# Patient Record
Sex: Male | Born: 1965
Health system: Southern US, Community
[De-identification: ages and names within clinical notes are randomized; demographics above are authoritative.]

## PROBLEM LIST (undated history)

## (undated) DIAGNOSIS — F329 Major depressive disorder, single episode, unspecified: Secondary | ICD-10-CM

## (undated) DIAGNOSIS — F32A Depression, unspecified: Secondary | ICD-10-CM

## (undated) DIAGNOSIS — E871 Hypo-osmolality and hyponatremia: Secondary | ICD-10-CM

## (undated) DIAGNOSIS — IMO0002 Reserved for concepts with insufficient information to code with codable children: Secondary | ICD-10-CM

## (undated) DIAGNOSIS — I8500 Esophageal varices without bleeding: Secondary | ICD-10-CM

## (undated) DIAGNOSIS — J449 Chronic obstructive pulmonary disease, unspecified: Secondary | ICD-10-CM

## (undated) DIAGNOSIS — K219 Gastro-esophageal reflux disease without esophagitis: Secondary | ICD-10-CM

## (undated) DIAGNOSIS — N179 Acute kidney failure, unspecified: Secondary | ICD-10-CM

## (undated) DIAGNOSIS — R0600 Dyspnea, unspecified: Secondary | ICD-10-CM

## (undated) DIAGNOSIS — M1990 Unspecified osteoarthritis, unspecified site: Secondary | ICD-10-CM

## (undated) DIAGNOSIS — G629 Polyneuropathy, unspecified: Secondary | ICD-10-CM

## (undated) DIAGNOSIS — G709 Myoneural disorder, unspecified: Secondary | ICD-10-CM

## (undated) DIAGNOSIS — D649 Anemia, unspecified: Secondary | ICD-10-CM

## (undated) DIAGNOSIS — E876 Hypokalemia: Secondary | ICD-10-CM

## (undated) DIAGNOSIS — Z5189 Encounter for other specified aftercare: Secondary | ICD-10-CM

## (undated) DIAGNOSIS — K7460 Unspecified cirrhosis of liver: Secondary | ICD-10-CM

## (undated) DIAGNOSIS — I609 Nontraumatic subarachnoid hemorrhage, unspecified: Secondary | ICD-10-CM

## (undated) DIAGNOSIS — F1910 Other psychoactive substance abuse, uncomplicated: Secondary | ICD-10-CM

## (undated) DIAGNOSIS — F419 Anxiety disorder, unspecified: Secondary | ICD-10-CM

## (undated) HISTORY — DX: Anxiety disorder, unspecified: F41.9

## (undated) HISTORY — DX: Anemia, unspecified: D64.9

## (undated) HISTORY — PX: CHOLECYSTECTOMY: SHX55

## (undated) HISTORY — DX: Other psychoactive substance abuse, uncomplicated: F19.10

## (undated) HISTORY — DX: Chronic obstructive pulmonary disease, unspecified: J44.9

## (undated) HISTORY — DX: Hypokalemia: E87.6

## (undated) HISTORY — DX: Polyneuropathy, unspecified: G62.9

## (undated) HISTORY — DX: Encounter for other specified aftercare: Z51.89

## (undated) HISTORY — DX: Acute kidney failure, unspecified: N17.9

## (undated) HISTORY — DX: Hypo-osmolality and hyponatremia: E87.1

## (undated) HISTORY — DX: Unspecified cirrhosis of liver: K74.60

## (undated) HISTORY — DX: Depression, unspecified: F32.A

## (undated) HISTORY — DX: Unspecified osteoarthritis, unspecified site: M19.90

## (undated) HISTORY — DX: Reserved for concepts with insufficient information to code with codable children: IMO0002

## (undated) HISTORY — DX: Major depressive disorder, single episode, unspecified: F32.9

---

## 2015-04-24 ENCOUNTER — Encounter: Payer: Self-pay | Admitting: Internal Medicine

## 2015-05-21 ENCOUNTER — Ambulatory Visit: Payer: Self-pay | Admitting: Gastroenterology

## 2015-06-10 ENCOUNTER — Encounter: Payer: Self-pay | Admitting: Gastroenterology

## 2015-06-10 ENCOUNTER — Ambulatory Visit (INDEPENDENT_AMBULATORY_CARE_PROVIDER_SITE_OTHER): Payer: Self-pay | Admitting: Gastroenterology

## 2015-06-10 ENCOUNTER — Other Ambulatory Visit: Payer: Self-pay

## 2015-06-10 VITALS — BP 100/69 | HR 74 | Temp 96.8°F | Ht 68.0 in | Wt 236.2 lb

## 2015-06-10 DIAGNOSIS — F329 Major depressive disorder, single episode, unspecified: Secondary | ICD-10-CM

## 2015-06-10 DIAGNOSIS — K7460 Unspecified cirrhosis of liver: Secondary | ICD-10-CM | POA: Insufficient documentation

## 2015-06-10 DIAGNOSIS — F419 Anxiety disorder, unspecified: Secondary | ICD-10-CM

## 2015-06-10 DIAGNOSIS — K7030 Alcoholic cirrhosis of liver without ascites: Secondary | ICD-10-CM

## 2015-06-10 DIAGNOSIS — F32A Depression, unspecified: Secondary | ICD-10-CM

## 2015-06-10 LAB — COMPLETE METABOLIC PANEL WITH GFR
ALBUMIN: 3.7 g/dL (ref 3.6–5.1)
ALT: 40 U/L (ref 9–46)
AST: 96 U/L — AB (ref 10–40)
Alkaline Phosphatase: 94 U/L (ref 40–115)
BILIRUBIN TOTAL: 3.6 mg/dL — AB (ref 0.2–1.2)
BUN: 6 mg/dL — AB (ref 7–25)
CALCIUM: 8.8 mg/dL (ref 8.6–10.3)
CHLORIDE: 99 mmol/L (ref 98–110)
CO2: 28 mmol/L (ref 20–31)
CREATININE: 0.6 mg/dL (ref 0.60–1.35)
GFR, Est African American: >89 mL/min (ref >=60)
GFR, Est Non African American: >89 mL/min (ref >=60)
Glucose, Bld: 79 mg/dL (ref 65–99)
Potassium: 3.7 mmol/L (ref 3.5–5.3)
Sodium: 136 mmol/L (ref 135–146)
TOTAL PROTEIN: 6.7 g/dL (ref 6.1–8.1)

## 2015-06-10 MED ORDER — LACTULOSE 10 GM/15ML PO SOLN: 10.0000 g | Freq: Two times a day (BID) | ORAL | Status: DC | PRN

## 2015-06-10 NOTE — Patient Instructions (Addendum)
Start taking lactulose twice a day to have 2-3 soft bowel movements.   We have scheduled you for an upper endoscopy with Dr. Jena Gauss.   Please have blood work done.   Great job on avoiding alcohol! Keep up the good work.   We will see you in 3 months.   Cirrhosis Cirrhosis is a condition of scarring of the liver which is caused when the liver has tried repairing itself following damage. This damage may come from a previous infection such as one of the forms of hepatitis (usually hepatitis C), or the damage may come from being injured by toxins. The main toxin that causes this damage is alcohol. The scarring of the liver from use of alcohol is irreversible. That means the liver cannot return to normal even though alcohol is not used any more. The main danger of hepatitis C infection is that it may cause long-lasting (chronic) liver disease, and this also may lead to cirrhosis. This complication is progressive and irreversible. CAUSES  Prior to available blood tests, hepatitis C could be contracted by blood transfusions. Since testing of blood has improved, this is now unlikely. This infection can also be contracted through intravenous drug use and the sharing of needles. It can also be contracted through sexual relationships. The injury caused by alcohol comes from too much use. It is not a few drinks that poison the liver, but years of misuse. Usually there will be some signs and symptoms early with scarring of the liver that suggest the development of better habits. Alcohol should never be used while using acetaminophen. A small dose of both taken together may cause irreversible damage to the liver. HOME CARE INSTRUCTIONS  There is no specific treatment for cirrhosis. However, there are things you can do to avoid making the condition worse.  Rest as needed.  Eat a well-balanced diet. Your caregiver can help you with suggestions.  Vitamin supplements including vitamins A, K, D, and thiamine can  help.  A low-salt diet, water restriction, or diuretic medicine may be needed to reduce fluid retention.  Avoid alcohol. This can be extremely toxic if combined with acetaminophen.  Avoid drugs which are toxic to the liver. Some of these include isoniazid, methyldopa, acetaminophen, anabolic steroids (muscle-building drugs), erythromycin, and oral contraceptives (birth control pills). Check with your caregiver to make sure medicines you are presently taking will not be harmful.  Periodic blood tests may be required. Follow your caregiver's advice regarding the timing of these.  Milk thistle is an herbal remedy which does protect the liver against toxins. However, it will not help once the liver has been scarred. SEEK MEDICAL CARE IF:  You have increasing fatigue or weakness.  You develop swelling of the hands, feet, legs, or face.  You vomit bright red blood, or a coffee ground appearing material.  You have blood in your stools, or the stools turn black and tarry.  You have a fever.  You develop loss of appetite, or have nausea and vomiting.  You develop jaundice.  You develop easy bruising or bleeding.  You have worsening of any of the problems you are concerned about. Document Released: 09/14/2005 Document Revised: 12/07/2011 Document Reviewed: 05/02/2008 [facility] Patient Information 2015 [place], [place]. This information is not intended to replace advice given to you by your health care provider. Make sure you discuss any questions you have with your health care provider.

## 2015-06-10 NOTE — Progress Notes (Signed)
Primary Care Physician:  Newman Nip, NP Primary Gastroenterologist:  Dr. Jena Gauss   Chief Complaint  Patient presents with  . Abdominal Pain    lower abd pain, comes and goes for several months    HPI:   Joseph Cortez is a 49 y.o. male presenting today at the request of his PCP secondary to history of gallstone pancreatitis (July 2016) and cirrhosis. Underwent cholecystectomy April 03, 2015 at [place]. Last imaging July 2016 via [facility] noting hepatic cirrhosis, mild diffuse pancreatic swelling and peripancreatic soft tissue stranding seen, consistent with acute pancreatitis.   Intermittent abdominal pain, lower abdominal pain. No association with bowel movements. Seemed to correlate some with increased ETOH use. No hematochezia. Maternal uncle had colon cancer. Feels confused at times. Hard to remember names. Joseph Cortez present. Takes a few minutes to remember names. Friend states he feels like it is slow to process. First told he had liver disease 3 years ago. No reflux or solid food dysphagia. Noted pill dysphagia if a "big pill". Appetite is good. Smoked cocaine a few years ago. No IV drug use. Recent FFP while inpatient. No tattoos.   No alcohol in 4 days. Historically has had about 2/5ths of wine a day.   No prior colonoscopy or prior endoscopy.   Past Medical History  Diagnosis Date  . Cirrhosis   . Anxiety   . Depression   . Neuropathy     Past Surgical History  Procedure Laterality Date  . Cholecystectomy      Current Outpatient Prescriptions  Medication Sig Dispense Refill  . clonazePAM (KLONOPIN) 0.5 MG tablet Take 0.5 mg by mouth daily.    . Multiple Vitamins-Minerals (MULTIVITAMIN WITH MINERALS) tablet Take 1 tablet by mouth daily.     No current facility-administered medications for this visit.    Allergies as of 06/10/2015  . (No Known Allergies)    Family History  Problem Relation Age of Onset  . Colon cancer Maternal Uncle     Social  History   Social History  . Marital Status: Single    Spouse Name: N/A  . Number of Children: N/A  . Years of Education: N/A   Occupational History  . Not on file.   Social History Main Topics  . Smoking status: Current Every Day Smoker -- 0.50 packs/day    Types: Cigarettes  . Smokeless tobacco: Not on file  . 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 Drug Use: No     Comment: cocaine historically, several years ago  . Sexual Activity: Not on file   Other Topics Concern  . Not on file   Social History Narrative  . No narrative on file    Review of Systems: Gen: see HPI CV: occasional palpitations  Resp: +DOE GI: see HPI GU : Denies urinary burning, urinary frequency, urinary hesitancy MS: +muscle weakness  Derm: Denies rash, itching, dry skin Psych: +depression/anxiety  Heme: Denies bruising, bleeding, and enlarged lymph nodes.  Physical Exam: BP 100/69 mmHg  Pulse 74  Temp(Src) 96.8 F (36 C) (Oral)  Ht  (1.727 m)  Wt 236 lb 3.2 oz (107.14 kg)  BMI 35.92 kg/m2 General:   Alert and oriented. Pleasant and cooperative. Well-nourished and well-developed.  Head:  Normocephalic and atraumatic. Eyes:  Without icterus, sclera clear  and conjunctiva pink.  Ears:  Normal auditory acuity. Nose:  No deformity, discharge,  or lesions.  Mouth:  No deformity or lesions, oral mucosa pink.  Lungs:  Clear to auscultation bilaterally. No wheezes, rales, or rhonchi. No distress.  Heart:  S1, S2 present without murmurs appreciated.  Abdomen:  +BS, soft, largely obese with large AP diameter. non-tender and non-distended. No fluid wave.   Rectal:  Deferred  Msk:  Symmetrical without gross deformities. Normal posture. Extremities:  Without edema. Neurologic:  Alert and  oriented x4;  grossly normal neurologically. Negative asterixis.  Psych:  Alert and cooperative.  Normal mood and affect.    

## 2015-06-11 ENCOUNTER — Other Ambulatory Visit: Payer: Self-pay

## 2015-06-11 LAB — CBC WITH DIFFERENTIAL/PLATELET
BASOS ABS: 0.1 10*3/uL (ref 0.0–0.1)
Basophils Relative: 1 % (ref 0–1)
Eosinophils Absolute: 0.1 10*3/uL (ref 0.0–0.7)
Eosinophils Relative: 2 % (ref 0–5)
HEMATOCRIT: 43.9 % (ref 39.0–52.0)
HEMOGLOBIN: 15 g/dL (ref 13.0–17.0)
LYMPHS ABS: 1.6 10*3/uL (ref 0.7–4.0)
LYMPHS PCT: 24 % (ref 12–46)
MCH: 33 pg (ref 26.0–34.0)
MCHC: 34.2 g/dL (ref 30.0–36.0)
MCV: 96.5 fL (ref 78.0–100.0)
MPV: 9.8 fL (ref 8.6–12.4)
Monocytes Absolute: 0.7 10*3/uL (ref 0.1–1.0)
Monocytes Relative: 10 % (ref 3–12)
NEUTROS ABS: 4.3 10*3/uL (ref 1.7–7.7)
Neutrophils Relative %: 63 % (ref 43–77)
Platelets: 142 10*3/uL — ABNORMAL LOW (ref 150–400)
RBC: 4.55 MIL/uL (ref 4.22–5.81)
RDW: 14.5 % (ref 11.5–15.5)
WBC: 6.8 10*3/uL (ref 4.0–10.5)

## 2015-06-11 LAB — HEPATITIS C ANTIBODY: HCV Ab: NEGATIVE

## 2015-06-11 LAB — ANA: ANA: NEGATIVE

## 2015-06-11 LAB — ANTI-SMOOTH MUSCLE ANTIBODY, IGG: Smooth Muscle Ab: 7 U (ref <20)

## 2015-06-11 LAB — MITOCHONDRIAL ANTIBODIES: Mitochondrial M2 Ab, IgG: 0.44 (ref <0.91)

## 2015-06-14 ENCOUNTER — Telehealth: Payer: Self-pay | Admitting: Gastroenterology

## 2015-06-14 NOTE — Assessment & Plan Note (Addendum)
49 year old male with cirrhosis, history of ETOH abuse, presenting to establish care. Imaging up-to-date and due again in Jan 2017. States he feels confused at times; negative asterixis on exam. Unclear if hepatic encephalopathy or secondary to chronic ETOH use. Will start Lactulose dosing and I have advised him not to drive. Needs further work-up for etiology of cirrhosis, although it is likely due to ETOH/fatty liver. Needs EGD for variceal screening and immunization against Hep A and B. Close follow-up, returning in 3 months. As of note, pill dysphagia with large pills. No solid food dysphagia. Likely no occult issue but will add possible dilation if needed.   Proceed with upper endoscopy+/-dilation in the near future with Dr. Jena Gauss. The risks, benefits, and alternatives have been discussed in detail with patient. They have stated understanding and desire to proceed.  PROPOFOL due to history of ETOH abuse Labs ordered (Hep C antibody, CBC, CMP, INR, autoimmune/PBC) Hep A and B vaccination prescription provided Refer to counselor due to depression/anxiety (NO suicidal or homicidal ideation) Continue ETOH abstinence 3 month return

## 2015-06-14 NOTE — Telephone Encounter (Signed)
Can we get the liver biopsy reports from [facility]? Thanks!

## 2015-06-16 NOTE — Progress Notes (Signed)
Quick Note:  Can we add an INR? I ordered one but it isn't resulting. Looks like cirrhosis likely ETOH related. ______

## 2015-06-17 ENCOUNTER — Other Ambulatory Visit: Payer: Self-pay

## 2015-06-17 DIAGNOSIS — K7460 Unspecified cirrhosis of liver: Secondary | ICD-10-CM

## 2015-06-17 NOTE — Progress Notes (Signed)
Quick Note:  I called [facility]. They did not have the tube done for the INR. ( Looks like it wasn't ordered, Tobi Bastos). Please advise if the pt needs to return. ______

## 2015-06-17 NOTE — Progress Notes (Signed)
Quick Note:  Yes, he needs an INR so I can calculate a MELD score. ______

## 2015-06-17 NOTE — Progress Notes (Signed)
Quick Note:  Pt is aware and will try to do within the next couple of days. ______

## 2015-06-17 NOTE — Progress Notes (Signed)
Quick Note:  LMOM to call. ______ 

## 2015-06-17 NOTE — Telephone Encounter (Signed)
requested

## 2015-06-17 NOTE — Progress Notes (Signed)
Quick Note:  Order has been faxed to [facility]. ______

## 2015-06-18 NOTE — Progress Notes (Signed)
Quick Note:  I sent the labs to be scanned, so I don't have them anymore. Susan, can we request last set of LFTs from PCP? I believe his bilirubin has improved from what I reviewed, but I need to make sure. ______ 

## 2015-06-19 NOTE — Progress Notes (Signed)
CC'D TO PCP °

## 2015-06-21 NOTE — Patient Instructions (Signed)
Joseph Cortez  06/21/2015     @   Your procedure is scheduled on  06/27/2015   Report to [facility] at  715  A.M.  Call this number if you have problems the morning of surgery:  [phone]   Remember:  Do not eat food or drink liquids after midnight.  Take these medicines the morning of surgery with A SIP OF WATER  klonopin   Do not wear jewelry, make-up or nail polish.  Do not wear lotions, powders, or perfumes.  You may wear deodorant.  Do not shave 48 hours prior to surgery.  Men may shave face and neck.  Do not bring valuables to the hospital.  [facility] is not responsible for any belongings or valuables.  Contacts, dentures or bridgework may not be worn into surgery.  Leave your suitcase in the car.  After surgery it may be brought to your room.  For patients admitted to the hospital, discharge time will be determined by your treatment team.  Patients discharged the day of surgery will not be allowed to drive home.   Name and phone number of your driver:   family Special instructions:  Follow the diet instructions given to you by Dr Luvenia Starch office.  Please read over the following fact sheets that you were given. Pain Booklet, Coughing and Deep Breathing, Surgical Site Infection Prevention, Anesthesia Post-op Instructions and Care and Recovery After Surgery      Esophagogastroduodenoscopy Esophagogastroduodenoscopy (EGD) is a procedure to examine the lining of the esophagus, stomach, and first part of the small intestine (duodenum). A long, flexible, lighted tube with a camera attached (endoscope) is inserted down the throat to view these organs. This procedure is done to detect problems or abnormalities, such as inflammation, bleeding, ulcers, or growths, in order to treat them. The procedure lasts about 5-20 minutes. It is usually an outpatient procedure, but it may need to be performed in emergency cases in the hospital. LET YOUR  CAREGIVER KNOW ABOUT:   Allergies to food or medicine.  All medicines you are taking, including vitamins, herbs, eyedrops, and over-the-counter medicines and creams.  Use of steroids (by mouth or creams).  Previous problems you or members of your family have had with the use of anesthetics.  Any blood disorders you have.  Previous surgeries you have had.  Other health problems you have.  Possibility of pregnancy, if this applies. RISKS AND COMPLICATIONS  Generally, EGD is a safe procedure. However, as with any procedure, complications can occur. Possible complications include:  Infection.  Bleeding.  Tearing (perforation) of the esophagus, stomach, or duodenum.  Difficulty breathing or not being able to breath.  Excessive sweating.  Spasms of the larynx.  Slowed heartbeat.  Low blood pressure. BEFORE THE PROCEDURE  Do not eat or drink anything for 6-8 hours before the procedure or as directed by your caregiver.  Ask your caregiver about changing or stopping your regular medicines.  If you wear dentures, be prepared to remove them before the procedure.  Arrange for someone to drive you home after the procedure. PROCEDURE   A vein will be accessed to give medicines and fluids. A medicine to relax you (sedative) and a pain reliever will be given through that access into the vein.  A numbing medicine (local anesthetic) may be sprayed on your throat for comfort and to stop you from gagging or coughing.  A mouth guard may be placed in your mouth  to protect your teeth and to keep you from biting on the endoscope.  You will be asked to lie on your left side.  The endoscope is inserted down your throat and into the esophagus, stomach, and duodenum.  Air is put through the endoscope to allow your caregiver to view the lining of your esophagus clearly.  The esophagus, stomach, and duodenum is then examined. During the exam, your caregiver may:  Remove tissue to be  examined under a microscope (biopsy) for inflammation, infection, or other medical problems.  Remove growths.  Remove objects (foreign bodies) that are stuck.  Treat any bleeding with medicines or other devices that stop tissues from bleeding (hot cautery, clipping devices).  Widen (dilate) or stretch narrowed areas of the esophagus and stomach.  The endoscope will then be withdrawn. AFTER THE PROCEDURE  You will be taken to a recovery area to be monitored. You will be able to go home once you are stable and alert.  Do not eat or drink anything until the local anesthetic and numbing medicines have worn off. You may choke.  It is normal to feel bloated, have pain with swallowing, or have a sore throat for a short time. This will wear off.  Your caregiver should be able to discuss his or her findings with you. It will take longer to discuss the test results if any biopsies were taken. Document Released: 01/15/2005 Document Revised: 01/29/2014 Document Reviewed: 08/17/2012 [facility] Patient Information 2015 [place], [place]. This information is not intended to replace advice given to you by your health care provider. Make sure you discuss any questions you have with your health care provider. Esophageal Dilatation The esophagus is the long, narrow tube which carries food and liquid from the mouth to the stomach. Esophageal dilatation is the technique used to stretch a blocked or narrowed portion of the esophagus. This procedure is used when a part of the esophagus has become so narrow that it becomes difficult, painful or even impossible to swallow. This is generally an uncomplicated form of treatment. When this is not successful, chest surgery may be required. This is a much more extensive form of treatment with a longer recovery time. CAUSES  Some of the more common causes of blockage or strictures of the esophagus are:  Narrowing from longstanding inflammation (soreness and redness) of the  lower esophagus. This comes from the constant exposure of the lower esophagus to the acid which bubbles up from the stomach. Over time this causes scarring and narrowing of the lower esophagus.  Hiatal hernia in which a small part of the stomach bulges (herniates) up through the diaphragm. This can cause a gradual narrowing of the end of the esophagus.  Schatzki ring is a narrow ring of benign (non-cancerous) fibrous tissue which constricts the lower esophagus. The reason for this is not known.  Scleroderma is a connective tissue disorder that affects the esophagus and makes swallowing difficult.  Achalasia is an absence of nerves to the lower esophagus and to the esophageal sphincter. This is the circular muscle between the stomach and esophagus that relaxes to allow food into the stomach. After swallowing, it contracts to keep food in the stomach. This absence of nerves may be congenital (present since birth). This can cause irregular spasms of the lower esophageal muscle. This spasm does not open up to allow food and fluid through. The result is a persistent blockage with subsequent slow trickling of the esophageal contents into the stomach.  Strictures may develop from  swallowing materials which damage the esophagus. Some examples are strong acids or alkalis such as lye.  Growths such as benign (non-cancerous) and malignant (cancerous) tumors can block the esophagus.  Hereditary (present since birth) causes. DIAGNOSIS  Your caregiver often suspects this problem by taking a medical history. They will also do a physical exam. They can then prove their suspicions using X-rays and endoscopy. Endoscopy is an exam in which a tube like a small, flexible telescope is used to look at your esophagus.  TREATMENT There are different stretching (dilating) techniques that can be used. Simple bougie dilatation may be done in the office. This usually takes only a couple minutes. A numbing (anesthetic) spray of  the throat is used. Endoscopy, when done, is done in an endoscopy suite under mild sedation. When fluoroscopy is used, the procedure is performed in X-ray. Other techniques require a little longer time. Recovery is usually quick. There is no waiting time to begin eating and drinking to test success of the treatment. Following are some of the methods used. Narrowing of the esophagus is treated by making it bigger. Commonly this is a mechanical problem which can be treated with stretching. This can be done in different ways. Your caregiver will discuss these with you. Some of the means used are:  A series of graduated (increasing thickness) flexible dilators can be used. These are weighted tubes passed through the esophagus into the stomach. The tubes used become progressively larger until the desired stretched size is reached. Graduated dilators are a simple and quick way of opening the esophagus. No visualization is required.  Another method is the use of endoscopy to place a flexible wire across the stricture. The endoscope is removed and the wire left in place. A dilator with a hole through it from end to end is guided down the esophagus and across the stricture. One or more of these dilators are passed over the wire. At the end of the exam, the wire is removed. This type of treatment may be performed in the X-ray department under fluoroscopy. An advantage of this procedure is the examiner is visualizing the end opening in the esophagus.  Stretching of the esophagus may be done using balloons. Deflated balloons are placed through the endoscope and across the stricture. This type of balloon dilatation is often done at the time of endoscopy or fluoroscopy. Flexible endoscopy allows the examiner to directly view the stricture. A balloon is inserted in the deflated form into the area of narrowing. It is then inflated with air to a certain pressure that is preset for a given circumference. When inflated, it  becomes sausage shaped, stretched, and makes the stricture larger.  Achalasia requires a longer, larger balloon-type dilator. This is frequently done under X-ray control. In this situation, the spastic muscle fibers in the lower esophagus are stretched. All of the above procedures make the passage of food and water into the stomach easier. They also make it easier for stomach contents to reflux back into the esophagus. Special medications may be used following the procedure to help prevent further stricturing. Proton-pump inhibitor medications are good at decreasing the amount of acid in the stomach juice. When stomach juice refluxes into the esophagus, the juice is no longer as acidic and is less likely to burn or scar the esophagus. RISKS AND COMPLICATIONS Esophageal dilatation is usually performed effectively and without problems. Some complications that can occur are:  A small amount of bleeding almost always happens where the stretching takes  place. If this is too excessive it may require more aggressive treatment.  An uncommon complication is perforation (making a hole) of the esophagus. The esophagus is thin. It is easy to make a hole in it. If this happens, an operation may be necessary to repair this.  A small, undetected perforation could lead to an infection in the chest. This can be very serious. HOME CARE INSTRUCTIONS   If you received sedation for your procedure, do not drive, make important decisions, or perform any activities requiring your full coordination. Do not drink alcohol, take sedatives, or use any mind altering chemicals unless instructed by your caregiver.  You may use throat lozenges or warm salt water gargles if you have throat discomfort.  You can begin eating and drinking normally on return home unless instructed otherwise. Do not purposely try to force large chunks of food down to test the benefits of your procedure.  Mild discomfort can be eased with sips of ice  water.  Medications for discomfort may or may not be needed. SEEK IMMEDIATE MEDICAL CARE IF:   You begin vomiting up blood.  You develop black, tarry stools.  You develop chills or an unexplained temperature of over 101F (38.3C)  You develop chest or abdominal pain.  You develop shortness of breath, or feel light-headed or faint.  Your swallowing is becoming more painful, difficult, or you are unable to swallow. MAKE SURE YOU:   Understand these instructions.  Will watch your condition.  Will get help right away if you are not doing well or get worse. Document Released: 11/05/2005 Document Revised: 01/29/2014 Document Reviewed: 12/23/2005 [facility] Patient Information 2015 [place], [place]. This information is not intended to replace advice given to you by your health care provider. Make sure you discuss any questions you have with your health care provider. PATIENT INSTRUCTIONS POST-ANESTHESIA  IMMEDIATELY FOLLOWING SURGERY:  Do not drive or operate machinery for the first twenty four hours after surgery.  Do not make any important decisions for twenty four hours after surgery or while taking narcotic pain medications or sedatives.  If you develop intractable nausea and vomiting or a severe headache please notify your doctor immediately.  FOLLOW-UP:  Please make an appointment with your surgeon as instructed. You do not need to follow up with anesthesia unless specifically instructed to do so.  WOUND CARE INSTRUCTIONS (if applicable):  Keep a dry clean dressing on the anesthesia/puncture wound site if there is drainage.  Once the wound has quit draining you may leave it open to air.  Generally you should leave the bandage intact for twenty four hours unless there is drainage.  If the epidural site drains for more than 36-48 hours please call the anesthesia department.  QUESTIONS?:  Please feel free to call your physician or the hospital operator if you have any questions, and they  will be happy to assist you.

## 2015-06-22 LAB — PROTIME-INR
INR: 1.25 (ref <1.50)
PROTHROMBIN TIME: 15.9 s — AB (ref 11.6–15.2)

## 2015-06-24 ENCOUNTER — Encounter (HOSPITAL_COMMUNITY): Payer: Self-pay

## 2015-06-24 ENCOUNTER — Encounter (HOSPITAL_COMMUNITY)
Admission: RE | Admit: 2015-06-24 | Discharge: 2015-06-24 | Disposition: A | Discharge status: Home / Self Care | Payer: Self-pay | Source: Ambulatory Visit | Attending: Internal Medicine | Admitting: Internal Medicine

## 2015-06-27 ENCOUNTER — Ambulatory Visit (HOSPITAL_COMMUNITY): Payer: Self-pay | Admitting: Anesthesiology

## 2015-06-27 ENCOUNTER — Encounter (HOSPITAL_COMMUNITY)
Admission: RE | Disposition: A | Discharge status: Home / Self Care | Payer: Self-pay | Source: Ambulatory Visit | Attending: Internal Medicine

## 2015-06-27 ENCOUNTER — Ambulatory Visit (HOSPITAL_COMMUNITY)
Admission: RE | Admit: 2015-06-27 | Discharge: 2015-06-27 | Disposition: A | Discharge status: Home / Self Care | Payer: Self-pay | Source: Ambulatory Visit | Attending: Internal Medicine | Admitting: Internal Medicine

## 2015-06-27 ENCOUNTER — Encounter (HOSPITAL_COMMUNITY): Payer: Self-pay | Admitting: *Deleted

## 2015-06-27 DIAGNOSIS — K2100 Gastro-esophageal reflux disease with esophagitis, without bleeding: Secondary | ICD-10-CM | POA: Insufficient documentation

## 2015-06-27 DIAGNOSIS — K7030 Alcoholic cirrhosis of liver without ascites: Secondary | ICD-10-CM | POA: Insufficient documentation

## 2015-06-27 DIAGNOSIS — K3189 Other diseases of stomach and duodenum: Secondary | ICD-10-CM

## 2015-06-27 DIAGNOSIS — K766 Portal hypertension: Secondary | ICD-10-CM

## 2015-06-27 DIAGNOSIS — Z79899 Other long term (current) drug therapy: Secondary | ICD-10-CM | POA: Insufficient documentation

## 2015-06-27 DIAGNOSIS — F418 Other specified anxiety disorders: Secondary | ICD-10-CM | POA: Insufficient documentation

## 2015-06-27 DIAGNOSIS — K269 Duodenal ulcer, unspecified as acute or chronic, without hemorrhage or perforation: Secondary | ICD-10-CM

## 2015-06-27 DIAGNOSIS — F17200 Nicotine dependence, unspecified, uncomplicated: Secondary | ICD-10-CM | POA: Insufficient documentation

## 2015-06-27 HISTORY — PX: BIOPSY: SHX5522

## 2015-06-27 HISTORY — PX: ESOPHAGOGASTRODUODENOSCOPY (EGD) WITH PROPOFOL: SHX5813

## 2015-06-27 SURGERY — ESOPHAGOGASTRODUODENOSCOPY (EGD) WITH PROPOFOL
Anesthesia: Monitor Anesthesia Care

## 2015-06-27 MED ORDER — MIDAZOLAM HCL 2 MG/2ML IJ SOLN
INTRAMUSCULAR | Status: AC
  Filled 2015-06-27: qty 2

## 2015-06-27 MED ORDER — LIDOCAINE HCL (CARDIAC) 10 MG/ML IV SOLN
INTRAVENOUS | Status: DC | PRN
  Administered 2015-06-27: 50 mg via INTRAVENOUS

## 2015-06-27 MED ORDER — LIDOCAINE HCL (PF) 1 % IJ SOLN
INTRAMUSCULAR | Status: AC
  Filled 2015-06-27: qty 5

## 2015-06-27 MED ORDER — STERILE WATER FOR IRRIGATION IR SOLN
Status: DC | PRN
  Administered 2015-06-27: 1000 mL

## 2015-06-27 MED ORDER — FENTANYL CITRATE (PF) 100 MCG/2ML IJ SOLN: 25.0000 ug | INTRAMUSCULAR | Status: DC | PRN

## 2015-06-27 MED ORDER — GLYCOPYRROLATE 0.2 MG/ML IJ SOLN
INTRAMUSCULAR | Status: AC
Start: 2015-06-27 — End: 2015-06-27
  Filled 2015-06-27: qty 1

## 2015-06-27 MED ORDER — PROPOFOL 500 MG/50ML IV EMUL
INTRAVENOUS | Status: DC | PRN
  Administered 2015-06-27: 125 ug/kg/min via INTRAVENOUS

## 2015-06-27 MED ORDER — MIDAZOLAM HCL 5 MG/5ML IJ SOLN
INTRAMUSCULAR | Status: DC | PRN
  Administered 2015-06-27: 2 mg via INTRAVENOUS

## 2015-06-27 MED ORDER — ONDANSETRON HCL 4 MG/2ML IJ SOLN
INTRAMUSCULAR | Status: AC
  Filled 2015-06-27: qty 2

## 2015-06-27 MED ORDER — FENTANYL CITRATE (PF) 100 MCG/2ML IJ SOLN
INTRAMUSCULAR | Status: AC
  Filled 2015-06-27: qty 2

## 2015-06-27 MED ORDER — LIDOCAINE VISCOUS 2 % MT SOLN
OROMUCOSAL | Status: AC
  Filled 2015-06-27: qty 15

## 2015-06-27 MED ORDER — ONDANSETRON HCL 4 MG/2ML IJ SOLN
4.0000 mg | Freq: Once | INTRAMUSCULAR | Status: AC
  Administered 2015-06-27: 4 mg via INTRAVENOUS

## 2015-06-27 MED ORDER — LIDOCAINE VISCOUS 2 % MT SOLN
3.0000 mL | Freq: Once | OROMUCOSAL | Status: AC
  Administered 2015-06-27: 3 mL via OROMUCOSAL

## 2015-06-27 MED ORDER — ONDANSETRON HCL 4 MG/2ML IJ SOLN: 4.0000 mg | Freq: Once | INTRAMUSCULAR | Status: DC | PRN

## 2015-06-27 MED ORDER — GLYCOPYRROLATE 0.2 MG/ML IJ SOLN
0.2000 mg | Freq: Once | INTRAMUSCULAR | Status: AC
  Administered 2015-06-27: 0.2 mg via INTRAVENOUS

## 2015-06-27 MED ORDER — MIDAZOLAM HCL 2 MG/2ML IJ SOLN
1.0000 mg | INTRAMUSCULAR | Status: DC | PRN
  Administered 2015-06-27 (×2): 2 mg via INTRAVENOUS
  Filled 2015-06-27: qty 2

## 2015-06-27 MED ORDER — FENTANYL CITRATE (PF) 100 MCG/2ML IJ SOLN
25.0000 ug | INTRAMUSCULAR | Status: AC
  Administered 2015-06-27 (×2): 25 ug via INTRAVENOUS

## 2015-06-27 MED ORDER — LACTATED RINGERS IV SOLN
INTRAVENOUS | Status: DC
  Administered 2015-06-27: 08:00:00 via INTRAVENOUS

## 2015-06-27 MED ORDER — MIDAZOLAM HCL 2 MG/2ML IJ SOLN
INTRAMUSCULAR | Status: AC
  Filled 2015-06-27: qty 4

## 2015-06-27 MED ORDER — PROPOFOL 10 MG/ML IV BOLUS
INTRAVENOUS | Status: AC
  Filled 2015-06-27: qty 20

## 2015-06-27 SURGICAL SUPPLY — 9 items
BLOCK BITE 60FR ADLT L/F BLUE (MISCELLANEOUS) ×4 IMPLANT
FLOOR PAD 36X40 (MISCELLANEOUS) ×4
FORCEPS BIOP RAD 4 LRG CAP 4 (CUTTING FORCEPS) ×4 IMPLANT
FORMALIN 10 PREFIL 20ML (MISCELLANEOUS) ×4 IMPLANT
KIT ENDO PROCEDURE PEN (KITS) ×4 IMPLANT
MANIFOLD NEPTUNE II (INSTRUMENTS) ×4 IMPLANT
PAD FLOOR 36X40 (MISCELLANEOUS) ×2 IMPLANT
TUBING IRRIGATION ENDOGATOR (MISCELLANEOUS) ×4 IMPLANT
WATER STERILE IRR 1000ML POUR (IV SOLUTION) ×4 IMPLANT

## 2015-06-27 NOTE — H&P (View-Only) (Signed)
Quick Note:  I sent the labs to be scanned, so I don't have them anymore. Darl Pikes, can we request last set of LFTs from PCP? I believe his bilirubin has improved from what I reviewed, but I need to make sure. ______

## 2015-06-27 NOTE — Anesthesia Procedure Notes (Signed)
Procedure Name: MAC Date/Time: 06/27/2015 8:45 AM Performed by: Pernell Dupre, AMY A Pre-anesthesia Checklist: Patient identified, Timeout performed, Emergency Drugs available, Suction available and Patient being monitored Oxygen Delivery Method: Simple face mask

## 2015-06-27 NOTE — Discharge Instructions (Signed)
EGD Discharge instructions Please read the instructions outlined below and refer to this sheet in the next few weeks. These discharge instructions provide you with general information on caring for yourself after you leave the hospital. Your doctor may also give you specific instructions. While your treatment has been planned according to the most current medical practices available, unavoidable complications occasionally occur. If you have any problems or questions after discharge, please call your doctor. ACTIVITY  You may resume your regular activity but move at a slower pace for the next 24 hours.   Take frequent rest periods for the next 24 hours.   Walking will help expel (get rid of) the air and reduce the bloated feeling in your abdomen.   No driving for 24 hours (because of the anesthesia (medicine) used during the test).   You may shower.   Do not sign any important legal documents or operate any machinery for 24 hours (because of the anesthesia used during the test).  NUTRITION  Drink plenty of fluids.   You may resume your normal diet.   Begin with a light meal and progress to your normal diet.   Avoid alcoholic beverages for 24 hours or as instructed by your caregiver.  MEDICATIONS  You may resume your normal medications unless your caregiver tells you otherwise.  WHAT YOU CAN EXPECT TODAY  You may experience abdominal discomfort such as a feeling of fullness or gas pains.  FOLLOW-UP  Your doctor will discuss the results of your test with you.  SEEK IMMEDIATE MEDICAL ATTENTION IF ANY OF THE FOLLOWING OCCUR:  Excessive nausea (feeling sick to your stomach) and/or vomiting.   Severe abdominal pain and distention (swelling).   Trouble swallowing.   Temperature over 101 F (37.8 C).   Rectal bleeding or vomiting of blood.     GERD and peptic ulcer disease information provided  Begin Protonix 40 mg daily  Avoid non-steroidal drugs.  Office visit with  [place] in 6 weeks  Further recommendations to follow pending review of pathology report Because of intermittent cloudy thinking /  encephalopathy, you should not drive or operate machinery  Further recommendations to follow pending review of pathology report  Peptic Ulcer A peptic ulcer is a sore in the lining of your esophagus (esophageal ulcer), stomach (gastric ulcer), or in the first part of your small intestine (duodenal ulcer). The ulcer causes erosion into the deeper tissue. CAUSES  Normally, the lining of the stomach and the small intestine protects itself from the acid that digests food. The protective lining can be damaged by:  An infection caused by a bacterium called Helicobacter pylori (H. pylori).  Regular use of nonsteroidal anti-inflammatory drugs (NSAIDs), such as ibuprofen or aspirin.  Smoking tobacco. Other risk factors include being older than 50, drinking alcohol excessively, and having a family history of ulcer disease.  SYMPTOMS   Burning pain or gnawing in the area between the chest and the belly button.  Heartburn.  Nausea and vomiting.  Bloating. The pain can be worse on an empty stomach and at night. If the ulcer results in bleeding, it can cause:  Black, tarry stools.  Vomiting of bright red blood.  Vomiting of coffee-ground-looking materials. DIAGNOSIS  A diagnosis is usually made based upon your history and an exam. Other tests and procedures may be performed to find the cause of the ulcer. Finding a cause will help determine the best treatment. Tests and procedures may include:  Blood tests, stool tests, or breath tests  to check for the bacterium H. pylori.  An upper gastrointestinal (GI) series of the esophagus, stomach, and small intestine.  An endoscopy to examine the esophagus, stomach, and small intestine.  A biopsy. TREATMENT  Treatment may include:  Eliminating the cause of the ulcer, such as smoking, NSAIDs, or alcohol.  Medicines to  reduce the amount of acid in your digestive tract.  Antibiotic medicines if the ulcer is caused by the H. pylori bacterium.  An upper endoscopy to treat a bleeding ulcer.  Surgery if the bleeding is severe or if the ulcer created a hole somewhere in the digestive system. HOME CARE INSTRUCTIONS   Avoid tobacco, alcohol, and caffeine. Smoking can increase the acid in the stomach, and continued smoking will impair the healing of ulcers.  Avoid foods and drinks that seem to cause discomfort or aggravate your ulcer.  Only take medicines as directed by your caregiver. Do not substitute over-the-counter medicines for prescription medicines without talking to your caregiver.  Keep any follow-up appointments and tests as directed. SEEK MEDICAL CARE IF:   Your do not improve within 7 days of starting treatment.  You have ongoing indigestion or heartburn. SEEK IMMEDIATE MEDICAL CARE IF:   You have sudden, sharp, or persistent abdominal pain.  You have bloody or dark black, tarry stools.  You vomit blood or vomit that looks like coffee grounds.  You become light-headed, weak, or feel faint.  You become sweaty or clammy. MAKE SURE YOU:   Understand these instructions.  Will watch your condition.  Will get help right away if you are not doing well or get worse. Document Released: 09/11/2000 Document Revised: 01/29/2014 Document Reviewed: 04/13/2012 [facility] Patient Information 2015 [place], [place]. This information is not intended to replace advice given to you by your health care provider. Make sure you discuss any questions you have with your health care provider.  Gastroesophageal Reflux Disease, Adult Gastroesophageal reflux disease (GERD) happens when acid from your stomach goes into your food pipe (esophagus). The acid can cause a burning feeling in your chest. Over time, the acid can make small holes (ulcers) in your food pipe.  HOME CARE  Ask your doctor for advice  about:  Losing weight.  Quitting smoking.  Alcohol use.  Avoid foods and drinks that make your problems worse. You may want to avoid:  Caffeine and alcohol.  Chocolate.  Mints.  Garlic and onions.  Spicy foods.  Citrus fruits, such as oranges, lemons, or limes.  Foods that contain tomato, such as sauce, chili, salsa, and pizza.  Fried and fatty foods.  Avoid lying down for 3 hours before you go to bed or before you take a nap.  Eat small meals often, instead of large meals.  Wear loose-fitting clothing. Do not wear anything tight around your waist.  Raise (elevate) the head of your bed 6 to 8 inches with wood blocks. Using extra pillows does not help.  Only take medicines as told by your doctor.  Do not take aspirin or ibuprofen. GET HELP RIGHT AWAY IF:   You have pain in your arms, neck, jaw, teeth, or back.  Your pain gets worse or changes.  You feel sick to your stomach (nauseous), throw up (vomit), or sweat (diaphoresis).  You feel short of breath, or you pass out (faint).  Your throw up is green, yellow, black, or looks like coffee grounds or blood.  Your poop (stool) is red, bloody, or black. MAKE SURE YOU:   Understand these instructions.  Will watch your condition.  Will get help right away if you are not doing well or get worse. Document Released: 03/02/2008 Document Revised: 12/07/2011 Document Reviewed: 04/03/2011 [facility] Patient Information 2015 [place], [place]. This information is not intended to replace advice given to you by your health care provider. Make sure you discuss any questions you have with your health care provider.

## 2015-06-27 NOTE — Transfer of Care (Signed)
Immediate Anesthesia Transfer of Care Note  Patient: Joseph Cortez  Procedure(s) Performed: Procedure(s): ESOPHAGOGASTRODUODENOSCOPY (EGD) WITH PROPOFOL (N/A) BIOPSY (Gastric)  Patient Location: PACU  Anesthesia Type:MAC  Level of Consciousness: awake, alert , oriented and patient cooperative  Airway & Oxygen Therapy: Patient Spontanous Breathing and Patient connected to face mask oxygen  Post-op Assessment: Report given to RN and Post -op Vital signs reviewed and stable  Post vital signs: Reviewed and stable  Last Vitals:  Filed Vitals:   06/27/15 0845  BP: 125/86  Pulse:   Temp:   Resp:     Complications: No apparent anesthesia complications

## 2015-06-27 NOTE — Anesthesia Preprocedure Evaluation (Addendum)
Anesthesia Evaluation  Patient identified by MRN, date of birth, ID band Patient awake    Reviewed: Allergy & Precautions, NPO status , Patient's Chart, lab work & pertinent test results  Airway Mallampati: II  TM Distance: >3 FB     Dental  (+) Teeth Intact, Dental Advisory Given   Pulmonary Current Smoker,    breath sounds clear to auscultation       Cardiovascular negative cardio ROS   Rhythm:Regular Rate:Normal     Neuro/Psych PSYCHIATRIC DISORDERS Anxiety Depression    GI/Hepatic (+) Cirrhosis     substance abuse  alcohol use,   Endo/Other    Renal/GU      Musculoskeletal   Abdominal   Peds  Hematology   Anesthesia Other Findings   Reproductive/Obstetrics                            Anesthesia Physical Anesthesia Plan  ASA: III  Anesthesia Plan: MAC   Post-op Pain Management:    Induction: Intravenous  Airway Management Planned:   Additional Equipment:   Intra-op Plan:   Post-operative Plan:   Informed Consent: I have reviewed the patients History and Physical, chart, labs and discussed the procedure including the risks, benefits and alternatives for the proposed anesthesia with the patient or authorized representative who has indicated his/her understanding and acceptance.     Plan Discussed with:   Anesthesia Plan Comments:         Anesthesia Quick Evaluation

## 2015-06-27 NOTE — Interval H&P Note (Signed)
History and Physical Interval Note:  06/27/2015 8:06 AM  Joseph Cortez  has presented today for surgery, with the diagnosis of dysphagia  The various methods of treatment have been discussed with the patient and family. After consideration of risks, benefits and other options for treatment, the patient has consented to  Procedure(s) with comments: ESOPHAGOGASTRODUODENOSCOPY (EGD) WITH PROPOFOL (N/A) - 0845 MALONEY DILATION (N/A) as a surgical intervention .  The patient's history has been reviewed, patient examined, no change in status, stable for surgery.  I have reviewed the patient's chart and labs.  Questions were answered to the patient's satisfaction.     [name]

## 2015-06-27 NOTE — Op Note (Signed)
[facility] [address] [place], [address]   ENDOSCOPY PROCEDURE REPORT  PATIENT: Joseph Cortez, Joseph Cortez  MR#: [id number] BIRTHDATE: 01-08-1966 , 49  yrs. old GENDER: male ENDOSCOPIST: R.  Roetta Sessions, MD FACP FACG REFERRED BY:  Clayton,monica PROCEDURE DATE:  07-16-15 PROCEDURE:  EGD w/ biopsy INDICATIONS:  Screening for esophageal varices; patient denies dysphagia to me today and is not interested in an esophageal dilation. MEDICATIONS: Deep sedation per Dr.  [facility] ASA CLASS:      Class III  CONSENT: The risks, benefits, limitations, alternatives and imponderables have been discussed.  The potential for biopsy, esophogeal dilation, etc. have also been reviewed.  Questions have been answered.  All parties agreeable.  Please see the history and physical in the medical record for more information.  DESCRIPTION OF PROCEDURE: After the risks benefits and alternatives of the procedure were thoroughly explained, informed consent was obtained.  The    endoscope was introduced through the mouth and advanced to the second portion of the duodenum , limited by Without limitations. The instrument was slowly withdrawn as the mucosa was fully examined. Estimated blood loss is zero unless otherwise noted in this procedure report.    Distal esophageal erosions within 5 mm of the GE junction.  No Barrett's esophagus.  No esophageal varices seen.  Stomach empty. Diffusely congested mucosa with snake skinning /fish scale appearance consistent with portal gastropathy.  No gastric varices. Patent pylorus.  Examination of the bulbar and second portion revealed a 3 mm bulbar ulcer only.  The gastric mucosa was biopsied for histologic study.  Retroflexed views revealed as previously described.     The scope was then withdrawn from the patient and the procedure completed.  COMPLICATIONS: There were no immediate complications. EBL 3 mL ENDOSCOPIC  IMPRESSION: Mild erosive reflux esophagitis per Portal gastropathy. Duodenal bulbar ulcer. Status post gastric biopsy  RECOMMENDATIONS: Begin Protonix 40 mg daily. Follow-up on pathology. Repeat EGD in 2 years. Because of intermittent encephalopathy symptoms, patient has been advised not to drive or operate  machinery  REPEAT EXAM:  eSigned:  R. Roetta Sessions, MD Jerrel Ivory [facility] July 16, 2015 9:13 AM    CC:  CPT CODES: ICD CODES:  The ICD and CPT codes recommended by this software are interpretations from the data that the clinical staff has captured with the software.  The verification of the translation of this report to the ICD and CPT codes and modifiers is the sole responsibility of the health care institution and practicing physician where this report was generated.  [employer] will not be held responsible for the validity of the ICD and CPT codes included on this report.  AMA assumes no liability for data contained or not contained herein. CPT is a Publishing rights manager of the [employer].  PATIENT NAME:  Joseph Cortez, Joseph Cortez MR#: [id number]

## 2015-06-27 NOTE — Interval H&P Note (Signed)
History and Physical Interval Note:  06/27/2015 8:07 AM  Joseph Cortez  has presented today for surgery, with the diagnosis of dysphagia  The various methods of treatment have been discussed with the patient and family. After consideration of risks, benefits and other options for treatment, the patient has consented to  Procedure(s) with comments: ESOPHAGOGASTRODUODENOSCOPY (EGD) WITH PROPOFOL (N/A) - 0845 MALONEY DILATION (N/A) as a surgical intervention .  The patient's history has been reviewed, patient examined, no change in status, stable for surgery.  I have reviewed the patient's chart and labs.  Questions were answered to the patient's satisfaction.     Joseph Cortez  Pt denies dysphagia although a little trouble with large vitamin capsules. No problem w food or usual meds; no dilation planned. The risks, benefits, limitations, alternatives and imponderables have been reviewed with the patient. Potential for esophageal dilation, biopsy, etc. have also been reviewed.  Questions have been answered. All parties agreeable.

## 2015-06-27 NOTE — H&P (View-Only) (Signed)
Primary Care Physician:  Joseph Nip, NP Primary Gastroenterologist:  Dr. Jena Cortez   Chief Complaint  Patient presents with  . Abdominal Pain    lower abd pain, comes and goes for several months    HPI:   Joseph Cortez is a 49 y.o. male presenting today at the request of his PCP secondary to history of gallstone pancreatitis (July 2016) and cirrhosis. Underwent cholecystectomy April 03, 2015 at [place]. Last imaging July 2016 via [facility] noting hepatic cirrhosis, mild diffuse pancreatic swelling and peripancreatic soft tissue stranding seen, consistent with acute pancreatitis.   Intermittent abdominal pain, lower abdominal pain. No association with bowel movements. Seemed to correlate some with increased ETOH use. No hematochezia. Maternal uncle had colon cancer. Feels confused at times. Hard to remember names. Joseph Cortez present. Takes a few minutes to remember names. Friend states he feels like it is slow to process. First told he had liver disease 3 years ago. No reflux or solid food dysphagia. Noted pill dysphagia if a "big pill". Appetite is good. Smoked cocaine a few years ago. No IV drug use. Recent FFP while inpatient. No tattoos.   No alcohol in 4 days. Historically has had about 2/5ths of wine a day.   No prior colonoscopy or prior endoscopy.   Past Medical History  Diagnosis Date  . Cirrhosis   . Anxiety   . Depression   . Neuropathy     Past Surgical History  Procedure Laterality Date  . Cholecystectomy      Current Outpatient Prescriptions  Medication Sig Dispense Refill  . clonazePAM (KLONOPIN) 0.5 MG tablet Take 0.5 mg by mouth daily.    . Multiple Vitamins-Minerals (MULTIVITAMIN WITH MINERALS) tablet Take 1 tablet by mouth daily.     No current facility-administered medications for this visit.    Allergies as of 06/10/2015  . (No Known Allergies)    Family History  Problem Relation Age of Onset  . Colon cancer Maternal Uncle     Social  History   Social History  . Marital Status: Single    Spouse Name: N/A  . Number of Children: N/A  . Years of Education: N/A   Occupational History  . Not on file.   Social History Main Topics  . Smoking status: Current Every Day Smoker -- 0.50 packs/day    Types: Cigarettes  . Smokeless tobacco: Not on file  . 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 Drug Use: No     Comment: cocaine historically, several years ago  . Sexual Activity: Not on file   Other Topics Concern  . Not on file   Social History Narrative  . No narrative on file    Review of Systems: Gen: see HPI CV: occasional palpitations  Resp: +DOE GI: see HPI GU : Denies urinary burning, urinary frequency, urinary hesitancy MS: +muscle weakness  Derm: Denies rash, itching, dry skin Psych: +depression/anxiety  Heme: Denies bruising, bleeding, and enlarged lymph nodes.  Physical Exam: BP 100/69 mmHg  Pulse 74  Temp(Src) 96.8 F (36 C) (Oral)  Ht  (1.727 m)  Wt 236 lb 3.2 oz (107.14 kg)  BMI 35.92 kg/m2 General:   Alert and oriented. Pleasant and cooperative. Well-nourished and well-developed.  Head:  Normocephalic and atraumatic. Eyes:  Without icterus, sclera clear  and conjunctiva pink.  Ears:  Normal auditory acuity. Nose:  No deformity, discharge,  or lesions.  Mouth:  No deformity or lesions, oral mucosa pink.  Lungs:  Clear to auscultation bilaterally. No wheezes, rales, or rhonchi. No distress.  Heart:  S1, S2 present without murmurs appreciated.  Abdomen:  +BS, soft, largely obese with large AP diameter. non-tender and non-distended. No fluid wave.   Rectal:  Deferred  Msk:  Symmetrical without gross deformities. Normal posture. Extremities:  Without edema. Neurologic:  Alert and  oriented x4;  grossly normal neurologically. Negative asterixis.  Psych:  Alert and cooperative.  Normal mood and affect.

## 2015-06-27 NOTE — H&P (View-Only) (Signed)
Quick Note:  Can we add an INR? I ordered one but it isn't resulting. Looks like cirrhosis likely ETOH related. ______ 

## 2015-06-27 NOTE — Interval H&P Note (Signed)
History and Physical Interval Note:  06/27/2015 8:35 AM  Joseph Cortez  has presented today for surgery, with the diagnosis of dysphagia  The various methods of treatment have been discussed with the patient and family. After consideration of risks, benefits and other options for treatment, the patient has consented to  Procedure(s) with comments: ESOPHAGOGASTRODUODENOSCOPY (EGD) WITH PROPOFOL (N/A) - 0845 MALONEY DILATION (N/A) as a surgical intervention .  The patient's history has been reviewed, patient examined, no change in status, stable for surgery.  I have reviewed the patient's chart and labs.  Questions were answered to the patient's satisfaction.     [name]  No dysphagia. Screening EGD for varices per plan.The risks, benefits, limitations, alternatives and imponderables have been reviewed with the patient. Potential for esophageal dilation, biopsy, etc. have also been reviewed.  Questions have been answered. All parties agreeable.

## 2015-06-27 NOTE — Anesthesia Postprocedure Evaluation (Signed)
  Anesthesia Post-op Note  Patient: Joseph Cortez  Procedure(s) Performed: Procedure(s): ESOPHAGOGASTRODUODENOSCOPY (EGD) WITH PROPOFOL (N/A) BIOPSY (Gastric)  Patient Location: PACU  Anesthesia Type:MAC  Level of Consciousness: awake, alert , oriented and patient cooperative  Airway and Oxygen Therapy: Patient Spontanous Breathing and Patient connected to face mask oxygen  Post-op Pain: none  Post-op Assessment: Post-op Vital signs reviewed, Patient's Cardiovascular Status Stable, Respiratory Function Stable, Patent Airway, No signs of Nausea or vomiting and Pain level controlled              Post-op Vital Signs: Reviewed and stable  Last Vitals:  Filed Vitals:   06/27/15 0845  BP: 125/86  Pulse:   Temp:   Resp:     Complications: No apparent anesthesia complications

## 2015-06-28 ENCOUNTER — Encounter (HOSPITAL_COMMUNITY): Payer: Self-pay | Admitting: Internal Medicine

## 2015-07-09 ENCOUNTER — Encounter: Payer: Self-pay | Admitting: Internal Medicine

## 2015-07-10 ENCOUNTER — Telehealth: Payer: Self-pay

## 2015-07-10 NOTE — Telephone Encounter (Signed)
Letter mailed to the pt. Please schedule ov. 

## 2015-07-10 NOTE — Telephone Encounter (Signed)
OV made °

## 2015-07-10 NOTE — Telephone Encounter (Signed)
Per RMR-  Send letter to patient.  Send copy of letter with path to referring provider and PCP.   Needs office visit in the coming months to reassess and proceed with cirrhosis care-extender

## 2015-07-23 NOTE — Progress Notes (Signed)
Quick Note:  Received last set of LFTs in July 2016. Tbili was 1.4, Alk Phos 139, AST 112 and ALT 45. His bilirubin has gone up slightly. MELD 14 from labs in Sept 2016. Needs to ABSOLUTELY stay away from ETOH. Keep appt with me in Dec. ______

## 2015-09-09 ENCOUNTER — Other Ambulatory Visit: Payer: Self-pay

## 2015-09-09 ENCOUNTER — Ambulatory Visit (INDEPENDENT_AMBULATORY_CARE_PROVIDER_SITE_OTHER): Payer: Self-pay | Admitting: Gastroenterology

## 2015-09-09 ENCOUNTER — Encounter: Payer: Self-pay | Admitting: Gastroenterology

## 2015-09-09 VITALS — BP 132/87 | HR 89 | Temp 98.0°F | Ht 68.0 in | Wt 239.6 lb

## 2015-09-09 DIAGNOSIS — K7030 Alcoholic cirrhosis of liver without ascites: Secondary | ICD-10-CM

## 2015-09-09 DIAGNOSIS — R188 Other ascites: Secondary | ICD-10-CM

## 2015-09-09 DIAGNOSIS — K7460 Unspecified cirrhosis of liver: Secondary | ICD-10-CM

## 2015-09-09 MED ORDER — LACTULOSE 10 GM/15ML PO SOLN: 10.0000 g | Freq: Two times a day (BID) | ORAL | Status: DC

## 2015-09-09 NOTE — Patient Instructions (Addendum)
Start taking Protonix once daily, 30 minutes before breakfast. You have the written prescription with you.   I also would like for you to continue the lactulose 2 times a day, with the goal of a bowel movement 2-3 times per day.  Please go to [facility] and have blood work done.  We have scheduled an ultrasound of your liver and a special ultrasound to evaluate if fluid can be removed. You need to follow a strict, low salt diet. No more than 2 grams per day. Do not add any extra salt to your food. I may need to start you on a low dose diuretic after I get your labs back.   You must stop drinking in order to keep your liver working well. If you don't, this could lead to worsening of your liver and even death. We will see about treatment facilities.   I will see you back in 3 months!  Low-Sodium Eating Plan Sodium raises blood pressure and causes water to be held in the body. Getting less sodium from food will help lower your blood pressure, reduce any swelling, and protect your heart, liver, and kidneys. We get sodium by adding salt (sodium chloride) to food. Most of our sodium comes from canned, boxed, and frozen foods. Restaurant foods, fast foods, and pizza are also very high in sodium. Even if you take medicine to lower your blood pressure or to reduce fluid in your body, getting less sodium from your food is important. WHAT IS MY PLAN? Most people should limit their sodium intake to 2,300 mg a day. Your health care provider recommends that you limit your sodium intake to 2 GRAMS a day.  WHAT DO I NEED TO KNOW ABOUT THIS EATING PLAN? For the low-sodium eating plan, you will follow these general guidelines:  Choose foods with a % Daily Value for sodium of less than 5% (as listed on the food label).   Use salt-free seasonings or herbs instead of table salt or sea salt.   Check with your health care provider or pharmacist before using salt substitutes.   Eat fresh foods.  Eat more  vegetables and fruits.  Limit canned vegetables. If you do use them, rinse them well to decrease the sodium.   Limit cheese to 1 oz (28 g) per day.   Eat lower-sodium products, often labeled as "lower sodium" or "no salt added."  Avoid foods that contain monosodium glutamate (MSG). MSG is sometimes added to [place] food and some canned foods.  Check food labels (Nutrition Facts labels) on foods to learn how much sodium is in one serving.  Eat more home-cooked food and less restaurant, buffet, and fast food.  When eating at a restaurant, ask that your food be prepared with less salt, or no salt if possible.  HOW DO I READ FOOD LABELS FOR SODIUM INFORMATION? The Nutrition Facts label lists the amount of sodium in one serving of the food. If you eat more than one serving, you must multiply the listed amount of sodium by the number of servings. Food labels may also identify foods as:  Sodium free--Less than 5 mg in a serving.  Very low sodium--35 mg or less in a serving.  Low sodium--140 mg or less in a serving.  Light in sodium--50% less sodium in a serving. For example, if a food that usually has 300 mg of sodium is changed to become light in sodium, it will have 150 mg of sodium.  Reduced sodium--25% less sodium in  a serving. For example, if a food that usually has 400 mg of sodium is changed to reduced sodium, it will have 300 mg of sodium. WHAT FOODS CAN I EAT? Grains Low-sodium cereals, including oats, puffed wheat and rice, and shredded wheat cereals. Low-sodium crackers. Unsalted rice and pasta. Lower-sodium bread.  Vegetables Frozen or fresh vegetables. Low-sodium or reduced-sodium canned vegetables. Low-sodium or reduced-sodium tomato sauce and paste. Low-sodium or reduced-sodium tomato and vegetable juices.  Fruits Fresh, frozen, and canned fruit. Fruit juice.  Meat and Other Protein Products Low-sodium canned tuna and salmon. Fresh or frozen meat, poultry,  seafood, and fish. Lamb. Unsalted nuts. Dried beans, peas, and lentils without added salt. Unsalted canned beans. Homemade soups without salt. Eggs.  Dairy Milk. Soy milk. Ricotta cheese. Low-sodium or reduced-sodium cheeses. Yogurt.  Condiments Fresh and dried herbs and spices. Salt-free seasonings. Onion and garlic powders. Low-sodium varieties of mustard and ketchup. Fresh or refrigerated horseradish. Lemon juice.  Fats and Oils Reduced-sodium salad dressings. Unsalted butter.  Other Unsalted popcorn and pretzels.  The items listed above may not be a complete list of recommended foods or beverages. Contact your dietitian for more options. WHAT FOODS ARE NOT RECOMMENDED? Grains Instant hot cereals. Bread stuffing, pancake, and biscuit mixes. Croutons. Seasoned rice or pasta mixes. Noodle soup cups. Boxed or frozen macaroni and cheese. Self-rising flour. Regular salted crackers. Vegetables Regular canned vegetables. Regular canned tomato sauce and paste. Regular tomato and vegetable juices. Frozen vegetables in sauces. Salted JamaicaFrench fries. Olives. Rosita FirePickles. Relishes. Sauerkraut. Salsa. Meat and Other Protein Products Salted, canned, smoked, spiced, or pickled meats, seafood, or fish. Bacon, ham, sausage, hot dogs, corned beef, chipped beef, and packaged luncheon meats. Salt pork. Jerky. Pickled herring. Anchovies, regular canned tuna, and sardines. Salted nuts. Dairy Processed cheese and cheese spreads. Cheese curds. Blue cheese and cottage cheese. Buttermilk.  Condiments Onion and garlic salt, seasoned salt, table salt, and sea salt. Canned and packaged gravies. Worcestershire sauce. Tartar sauce. Barbecue sauce. Teriyaki sauce. Soy sauce, including reduced sodium. Steak sauce. Fish sauce. Oyster sauce. Cocktail sauce. Horseradish that you find on the shelf. Regular ketchup and mustard. Meat flavorings and tenderizers. Bouillon cubes. Hot sauce. Tabasco sauce. Marinades. Taco  seasonings. Relishes. Fats and Oils Regular salad dressings. Salted butter. Margarine. Ghee. Bacon fat.  Other Potato and tortilla chips. Corn chips and puffs. Salted popcorn and pretzels. Canned or dried soups. Pizza. Frozen entrees and pot pies.  The items listed above may not be a complete list of foods and beverages to avoid. Contact your dietitian for more information.   This information is not intended to replace advice given to you by your health care provider. Make sure you discuss any questions you have with your health care provider.   Document Released: 03/06/2002 Document Revised: 10/05/2014 Document Reviewed: 07/19/2013 Elsevier Interactive Patient Education [employer]2016 Elsevier Inc.

## 2015-09-09 NOTE — Progress Notes (Signed)
Referring Provider: Newman Niplayton, Monica, NP Primary Care Physician:  Newman NipLAYTON, MONICA, NP  Primary GI: Dr. Jena Gaussourk   Chief Complaint  Patient presents with  . Abdominal Pain    HPI:   Joseph Cortez is a 49 y.o. 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 Due for surveillance in 2018. Due for ultrasound now [facility]. Question of encephalopathy in past. Hep A and B immunization: in process of completing vaccinations.   Notes lower abdominal discomfort in the evenings, "sharp and burning". Worse with movement. Feels better if lays back in chair. BM daily usually a Bristol scale 4. Sometimes feels like he needs to go some more. Sometimes may have looser stool. Took all of lactulose and hasn't had any more. Some confusion at times, states "not that bad just not clear headed sometimes". Drove by himself here today. Thinks he may have seen scant paper hematochezia last week. Isolated incidence. CONTINUES TO DRINK. Drinks a glass or 2 of wine about every other day per his report.   Past Medical History  Diagnosis Date  . Cirrhosis (HCC)   . Anxiety   . Depression   . Neuropathy [facility](HCC)     Past Surgical History  Procedure Laterality Date  . Cholecystectomy    . Esophagogastroduodenoscopy (egd) with propofol N/A 06/27/2015    WUJ:WJXBRMR:mild erosive reflux esophagitis, portal gastropathy, duodenal bulbar ulcer, negative H.pylori. Surveillance due in 2 years.   . Esophageal biopsy  06/27/2015    Procedure: BIOPSY (Gastric);  Surgeon: Corbin Adeobert M Rourk, MD;  Location: [facility];  Service: Endoscopy;;    Current Outpatient Prescriptions  Medication Sig Dispense Refill  . clonazePAM (KLONOPIN) 0.5 MG tablet Take 0.5 mg by mouth daily.    . Multiple Vitamins-Minerals (MULTIVITAMIN WITH MINERALS) tablet Take 1 tablet by mouth daily.     No current facility-administered medications for this visit.    Allergies as of  09/09/2015  . (No Known Allergies)    Family History  Problem Relation Age of Onset  . Colon cancer Maternal Uncle     Social History   Social History  . Marital Status: Divorced    Spouse Name: N/A  . Number of Children: N/A  . Years of Education: N/A   Social History Main Topics  . Smoking status: Current Every Day Smoker -- 0.50 packs/day    Types: Cigarettes  . Smokeless tobacco: None  . 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 Drug Use: No     Comment: cocaine historically, several years ago  . Sexual Activity: Not Asked   Other Topics Concern  . None   Social History Narrative    Review of Systems: Negative unless mentioned in HPI.   Physical Exam: BP 132/87 mmHg  Pulse 89  Temp(Src) 98 F (36.7 C) (Oral)  Ht 5\' 8"  (1.727 m)  Wt 239 lb 9.6 oz (108.682 kg)  BMI 36.44 kg/m2 General:   Alert and oriented. No distress noted. Pleasant and cooperative.  Head:  Normocephalic and atraumatic. Eyes:  Conjuctiva clear without scleral icterus. Mouth:  Oral mucosa pink and moist. Good dentition. No lesions. Abdomen:  +BS, soft, obese, question moderately tense ascites Msk:  Symmetrical without gross deformities. Normal posture. Extremities:  Without edema. Neurologic:  Alert and  oriented x4;  grossly normal neurologically. Psych:  Alert and cooperative. Normal mood and affect.  Lab Results  Component Value Date   WBC 6.8 06/10/2015   HGB 15.0 06/10/2015   HCT 43.9 06/10/2015   MCV 96.5 06/10/2015   PLT 142* 06/10/2015   Lab Results  Component Value Date   ALT 40 06/10/2015   AST 96* 06/10/2015   ALKPHOS 94 06/10/2015   BILITOT 3.6* 06/10/2015   Lab Results  Component Value Date   CREATININE 0.60 06/10/2015   BUN 6* 06/10/2015   NA 136 06/10/2015   K 3.7 06/10/2015   CL 99 06/10/2015   CO2 28 06/10/2015

## 2015-09-10 LAB — CBC
HEMATOCRIT: 47 % (ref 39.0–52.0)
Hemoglobin: 16.4 g/dL (ref 13.0–17.0)
MCH: 33.3 pg (ref 26.0–34.0)
MCHC: 34.9 g/dL (ref 30.0–36.0)
MCV: 95.3 fL (ref 78.0–100.0)
MPV: 9.6 fL (ref 8.6–12.4)
PLATELETS: 153 10*3/uL (ref 150–400)
RBC: 4.93 MIL/uL (ref 4.22–5.81)
RDW: 14.5 % (ref 11.5–15.5)
WBC: 8.7 10*3/uL (ref 4.0–10.5)

## 2015-09-10 LAB — HEPATIC FUNCTION PANEL
ALBUMIN: 4 g/dL (ref 3.6–5.1)
ALK PHOS: 132 U/L — AB (ref 40–115)
ALT: 25 U/L (ref 9–46)
AST: 95 U/L — AB (ref 10–40)
BILIRUBIN TOTAL: 2.4 mg/dL — AB (ref 0.2–1.2)
Bilirubin, Direct: 1 mg/dL — ABNORMAL HIGH (ref <=0.2)
Indirect Bilirubin: 1.4 mg/dL — ABNORMAL HIGH (ref 0.2–1.2)
TOTAL PROTEIN: 7.3 g/dL (ref 6.1–8.1)

## 2015-09-10 LAB — BASIC METABOLIC PANEL
BUN: 6 mg/dL — AB (ref 7–25)
CALCIUM: 9.3 mg/dL (ref 8.6–10.3)
CHLORIDE: 97 mmol/L — AB (ref 98–110)
CO2: 26 mmol/L (ref 20–31)
CREATININE: 0.64 mg/dL (ref 0.60–1.35)
Glucose, Bld: 88 mg/dL (ref 65–99)
Potassium: 4.5 mmol/L (ref 3.5–5.3)
Sodium: 136 mmol/L (ref 135–146)

## 2015-09-10 LAB — PROTIME-INR
INR: 1.56 — AB (ref <1.50)
Prothrombin Time: 18.9 seconds — ABNORMAL HIGH (ref 11.6–15.2)

## 2015-09-13 ENCOUNTER — Ambulatory Visit (HOSPITAL_COMMUNITY): Admission: RE | Admit: 2015-09-13 | Payer: Self-pay | Source: Ambulatory Visit

## 2015-09-15 NOTE — Progress Notes (Signed)
Quick Note:  MELD 15. He has GOT to stop drinking as I discussed in his visit.   What happened with the paracentesis? I will hold on any diuretic therapy until we see what this shows. ______

## 2015-09-17 NOTE — Assessment & Plan Note (Signed)
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 Concern for moderately tense ascites on exam. Concern also for mild encephalopathy. He has been cautioned NOT to drive on multiple occasions, but he drove himself to this appointment. I discussed in depth abstaining from any machinery or vehicles. Up-to-date on variceal screening and due again in 2018.   1. US of liver for HCC screening now and paracentesis with fluid analysis if fluid present. 2. Low sodium diet 3. ETOH cessation 4. Labs now 5. May need low dose diuretic therapy 6. Complete Hep A/B vaccinations: in process 7. Lactulose dosing discussed 8. Monitor for any further low-volume hematochezia and proceed with colonoscopy if any present 9. Next EGD in 2018 10. 3 month follow-up  11. Protonix daily: sent again to pharmacy

## 2015-09-18 NOTE — Progress Notes (Signed)
CC'ED TO PCP 

## 2015-12-09 ENCOUNTER — Ambulatory Visit: Payer: Self-pay | Admitting: Gastroenterology

## 2015-12-11 ENCOUNTER — Ambulatory Visit: Payer: Self-pay | Admitting: Gastroenterology

## 2015-12-26 ENCOUNTER — Encounter: Payer: Self-pay | Admitting: Gastroenterology

## 2015-12-26 ENCOUNTER — Ambulatory Visit (INDEPENDENT_AMBULATORY_CARE_PROVIDER_SITE_OTHER): Payer: Self-pay | Admitting: Gastroenterology

## 2015-12-26 VITALS — BP 117/75 | HR 77 | Temp 96.8°F | Ht 68.0 in | Wt 234.4 lb

## 2015-12-26 DIAGNOSIS — K7030 Alcoholic cirrhosis of liver without ascites: Secondary | ICD-10-CM

## 2015-12-26 LAB — PROTIME-INR
INR: 1.5 — ABNORMAL HIGH (ref <1.50)
Prothrombin Time: 18.3 seconds — ABNORMAL HIGH (ref 11.6–15.2)

## 2015-12-26 LAB — COMPLETE METABOLIC PANEL WITH GFR
ALBUMIN: 4.4 g/dL (ref 3.6–5.1)
ALK PHOS: 123 U/L — AB (ref 40–115)
ALT: 21 U/L (ref 9–46)
AST: 56 U/L — AB (ref 10–40)
BILIRUBIN TOTAL: 2.1 mg/dL — AB (ref 0.2–1.2)
BUN: 4 mg/dL — ABNORMAL LOW (ref 7–25)
CO2: 19 mmol/L — AB (ref 20–31)
CREATININE: 0.54 mg/dL — AB (ref 0.60–1.35)
Calcium: 9 mg/dL (ref 8.6–10.3)
Chloride: 105 mmol/L (ref 98–110)
GLUCOSE: 121 mg/dL — AB (ref 65–99)
Potassium: 3.9 mmol/L (ref 3.5–5.3)
SODIUM: 140 mmol/L (ref 135–146)
TOTAL PROTEIN: 7.9 g/dL (ref 6.1–8.1)

## 2015-12-26 NOTE — Patient Instructions (Signed)
I'm here if you need anything.  Please have blood work done today.  We have scheduled an ultrasound in the future.  I will see you back in 4 weeks.

## 2015-12-26 NOTE — Assessment & Plan Note (Signed)
Continues to drink despite recommendations and actually intoxicated at time of visit. I had a lengthy discussion with patient regarding poor prognosis if persistent drinking. Discussed eventual liver failure and death with the path he was taking. Under care of psychiatrist, fortunately. Will need yearly EGDs due to ongoing ETOH in setting of cirrhosis. Check labs now and US abdomen. 4 week follow-up.

## 2015-12-26 NOTE — Progress Notes (Signed)
Referring Provider: Newman Nip, NP Primary Care Physician:  Newman Nip, NP  Primary GI: Dr. Jena Gauss   Chief Complaint  Patient presents with  . Follow-up    vomiting    HPI:   Joseph Cortez is a 50 y.o. 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 Due for surveillance in 2018. Low-volume hematochezia at last visit likely benign, with goal of colonoscopy if any further evidence. MELD 15 at last visit. He is overdue for US abdomen. Concern for encephalopathy in the past, with lactulose dosing discussed at last visit.   Day before yesterday had N/V. Yesterday resolved. Has had stomach discomfort with drinking ETOH. Had a 1/5 of wine this morning. Suicidal recently, went to [place] voluntarily, sent to [place] for rehab. Has an appt on April 11th with psychiatry. No lower extremity edema. Friend present with him, brought him to appt. Friend concerned about patient. Patient admits to thoughts of suicide but no thoughts at time of visit. He says he has thought about a plan but does not elaborate. Friend states "he will drink himself to death". Mother provides money for patient. Patient uses credit card to get ETOH and mom pays it off every month. Patient is willing to have me call his mother to discuss ways of avoiding enabling of this behavior. Girlfriend is supportive.     Past Medical History  Diagnosis Date  . Cirrhosis (HCC)   . Anxiety   . Depression   . Neuropathy [facility])     Past Surgical History  Procedure Laterality Date  . Cholecystectomy    . Esophagogastroduodenoscopy (egd) with propofol N/A 06/27/2015    WUJ:WJXB erosive reflux esophagitis, portal gastropathy, duodenal bulbar ulcer, negative H.pylori. Surveillance due yearly according to ASGE current guidelines regarding ETOH cirrhosis and ongoing ETOH abuse  . Biopsy  06/27/2015    Procedure: BIOPSY (Gastric);  Surgeon: Corbin Ade, MD;  Location: [facility];  Service: Endoscopy;;    Current Outpatient Prescriptions  Medication Sig Dispense Refill  . clonazePAM (KLONOPIN) 0.5 MG tablet Take 0.5 mg by mouth daily.    Marland Kitchen lactulose (CHRONULAC) 10 GM/15ML solution Take 15 mLs (10 g total) by mouth 2 (two) times daily. To have a bowel movement 2-3 times per day. 473 mL 5  . Multiple Vitamins-Minerals (MULTIVITAMIN WITH MINERALS) tablet Take 1 tablet by mouth daily.    . pantoprazole (PROTONIX) 40 MG tablet Take 40 mg by mouth daily.     No current facility-administered medications for this visit.    Allergies as of 12/26/2015  . (No Known Allergies)    Family History  Problem Relation Age of Onset  . Colon cancer Maternal Uncle     Social History   Social History  . Marital Status: Divorced    Spouse Name: N/A  . Number of Children: N/A  . Years of Education: N/A   Social History Main Topics  . Smoking status: Current Every Day Smoker -- 0.50 packs/day    Types: Cigarettes  . Smokeless tobacco: None  . Alcohol Use: 0.0 oz/week    0 Standard drinks or equivalent per week     Comment: Continued ETOH use  . Drug Use: No     Comment: cocaine historically, several years ago  . Sexual Activity: Not Asked   Other Topics Concern  . None   Social History Narrative    Review of Systems: As mentioned in HPI.  Physical Exam: BP 117/75 mmHg  Pulse 77  Temp(Src) 96.8 F (36 C) (Oral)  Ht 5\' 8"  (1.727 m)  Wt 234 lb 6.4 oz (106.323 kg)  BMI 35.65 kg/m2 General:   Alert and oriented. Slurring words intermittently.  Head:  Normocephalic and atraumatic. Eyes:  +mild scleral icterus  Mouth:  Oral mucosa pink and moist. Good dentition. No lesions Abdomen:  +BS, round and distended but soft. No tense ascites. Liver grossly enlarged and palpable.  Msk:  Symmetrical without gross deformities. Normal posture. Extremities:  Without edema. Neurologic:  Alert and  oriented x4. Intoxicated.  Psych:  Alert and  cooperative.

## 2015-12-27 NOTE — Progress Notes (Signed)
cc'ed to pcp °

## 2015-12-27 NOTE — Progress Notes (Signed)
Quick Note:  MELD 15. LFTs overall about the same. INR remaining the same. Awaiting US abdomen. ______

## 2015-12-30 ENCOUNTER — Ambulatory Visit (HOSPITAL_COMMUNITY): Admission: RE | Admit: 2015-12-30 | Payer: Self-pay | Source: Ambulatory Visit

## 2016-01-06 HISTORY — PX: ESOPHAGOGASTRODUODENOSCOPY: SHX1529

## 2016-01-14 NOTE — Progress Notes (Signed)
Patient agreed to me contacting his mother. I spoke with Rosalene BillingsLinda Carreiro, his mom. She [facility]. Appears they did an upper endoscopy. CT scan showed cirrhosis, possible pancreatitis, prominent perirectal collateral veins.    Darl PikesSusan: can we get the procedure notes from the [facility]GD [facility]?

## 2016-01-15 NOTE — Progress Notes (Signed)
I printed it from [facility] and it's in your box.

## 2016-01-29 ENCOUNTER — Encounter: Payer: Self-pay | Admitting: Gastroenterology

## 2016-01-29 ENCOUNTER — Encounter (HOSPITAL_COMMUNITY)
Admission: RE | Admit: 2016-01-29 | Discharge: 2016-01-29 | Disposition: A | Discharge status: Home / Self Care | Payer: Self-pay | Source: Ambulatory Visit | Attending: Internal Medicine | Admitting: Internal Medicine

## 2016-01-29 ENCOUNTER — Other Ambulatory Visit: Payer: Self-pay

## 2016-01-29 ENCOUNTER — Encounter (HOSPITAL_COMMUNITY): Payer: Self-pay

## 2016-01-29 ENCOUNTER — Ambulatory Visit (INDEPENDENT_AMBULATORY_CARE_PROVIDER_SITE_OTHER): Payer: Self-pay | Admitting: Gastroenterology

## 2016-01-29 VITALS — BP 123/81 | HR 74 | Temp 97.9°F | Ht 68.0 in | Wt 235.2 lb

## 2016-01-29 DIAGNOSIS — K7030 Alcoholic cirrhosis of liver without ascites: Secondary | ICD-10-CM

## 2016-01-29 DIAGNOSIS — Z01810 Encounter for preprocedural cardiovascular examination: Secondary | ICD-10-CM | POA: Insufficient documentation

## 2016-01-29 HISTORY — DX: Myoneural disorder, unspecified: G70.9

## 2016-01-29 LAB — CBC
HEMATOCRIT: 39.1 % (ref 38.5–50.0)
Hemoglobin: 13.4 g/dL (ref 13.2–17.1)
MCH: 32.1 pg (ref 27.0–33.0)
MCHC: 34.3 g/dL (ref 32.0–36.0)
MCV: 93.5 fL (ref 80.0–100.0)
MPV: 10 fL (ref 7.5–12.5)
PLATELETS: 168 10*3/uL (ref 140–400)
RBC: 4.18 MIL/uL — AB (ref 4.20–5.80)
RDW: 14.4 % (ref 11.0–15.0)
WBC: 8.3 10*3/uL (ref 3.8–10.8)

## 2016-01-29 LAB — PROTIME-INR
INR: 1.36 (ref <1.50)
PROTHROMBIN TIME: 16.9 s — AB (ref 11.6–15.2)

## 2016-01-29 MED ORDER — PANTOPRAZOLE SODIUM 40 MG PO TBEC: 40.0000 mg | DELAYED_RELEASE_TABLET | Freq: Two times a day (BID) | ORAL | Status: DC

## 2016-01-29 MED ORDER — PROPRANOLOL HCL 20 MG PO TABS: 20.0000 mg | ORAL_TABLET | Freq: Two times a day (BID) | ORAL | Status: DC

## 2016-01-29 MED ORDER — LACTULOSE 10 GM/15ML PO SOLN: 20.0000 g | Freq: Two times a day (BID) | ORAL | Status: DC

## 2016-01-29 NOTE — Patient Instructions (Addendum)
Increase your lactulose dosing to 30 ml twice a day. The goal is 3 soft bowel movements a day.   Start propanolol 20 mg twice a day. This is to keep your heart rate lower. The goal is 55 beats per minute. If this causes dizziness, weakness, fatigue, call me. We may need to titrate this.   I have sent in Protonix to take twice a day, 30 minutes before breakfast and dinner.   We have scheduled you for an upper endoscopy with banding with Dr. Jena Gaussourk in the near future.  I am so proud of you for stopping alcohol!! We are here to help. If you have any signs of throwing up blood, black tarry stool, or bright red blood per rectum, go to the emergency room.

## 2016-01-29 NOTE — Patient Instructions (Signed)
Joseph EstimableMichael Bienvenue  01/29/2016     @PREFPERIOPPHARMACY @   Your procedure is scheduled on 01/30/2016.  Report to [facility] at 8:00 A.M.  Call this number if you have problems the morning of surgery:  [phone]   Remember:  Do not eat food or drink liquids after midnight.  Take these medicines the morning of surgery with A SIP OF WATER Klonopin, Corgard, Protonix, Inderal   Do not wear jewelry, make-up or nail polish.  Do not wear lotions, powders, or perfumes.  You may wear deodorant.  Do not shave 48 hours prior to surgery.  Men may shave face and neck.  Do not bring valuables to the hospital.  [facility] is not responsible for any belongings or valuables.  Contacts, dentures or bridgework may not be worn into surgery.  Leave your suitcase in the car.  After surgery it may be brought to your room.  For patients admitted to the hospital, discharge time will be determined by your treatment team.  Patients discharged the day of surgery will not be allowed to drive home.    Please read over the following fact sheets that you were given. Anesthesia Post-op Instructions     PATIENT INSTRUCTIONS POST-ANESTHESIA  IMMEDIATELY FOLLOWING SURGERY:  Do not drive or operate machinery for the first twenty four hours after surgery.  Do not make any important decisions for twenty four hours after surgery or while taking narcotic pain medications or sedatives.  If you develop intractable nausea and vomiting or a severe headache please notify your doctor immediately.  FOLLOW-UP:  Please make an appointment with your surgeon as instructed. You do not need to follow up with anesthesia unless specifically instructed to do so.  WOUND CARE INSTRUCTIONS (if applicable):  Keep a dry clean dressing on the anesthesia/puncture wound site if there is drainage.  Once the wound has quit draining you may leave it open to air.  Generally you should leave the bandage intact for twenty four hours unless there  is drainage.  If the epidural site drains for more than 36-48 hours please call the anesthesia department.  QUESTIONS?:  Please feel free to call your physician or the hospital operator if you have any questions, and they will be happy to assist you.      Esophageal Varices The esophagus is the passage that connects the throat to the stomach. Esophageal varices are blood vessels in the esophagus that have become enlarged. They develop when extra blood is forced to flow through them because the blood's normal pathway is blocked. Without treatment these blood vessels eventually break and bleed (hemorrhage). A hemorrhage is life-threatening. CAUSES This condition may be caused by:  Scarring of the liver (cirrhosis) due to alcoholism. This is the most common cause.  Liver disease.  Severe heart failure.  A blood clot in the portal vein.  Sarcoidosis. This is an inflammatory disease that can affect the liver.  Schistosomiasis. This is a parasitic infection that can cause liver damage. SYMPTOMS Usually there are no symptoms unless the esophageal varices bleed. Symptoms of bleeding esophageal varices include:  Vomiting material that is bright red or that is black and looks like coffee grounds.  Coughing up blood.  Black, tarry stools.  Dizziness or lightheadedness.  Low blood pressure.  Loss of consciousness. DIAGNOSIS This condition is diagnosed with tests, such as:  Endoscopy. During this test a thin, lighted tube is inserted through the mouth and into the esophagus.  Blood tests. These may be  done to check liver function, blood counts, and the body's ability to form blood clots. TREATMENT This condition may be treated with:  Medicines that reduce pressure in the esophageal varices and reduce the risk of bleeding.  Procedures to reduce pressure in the esophageal varices and reduce the risk of bleeding or stop bleeding. These include:  Variceal ligation. In this procedure, a  rubber band is placed around the esophageal varices to keep them from bleeding.  Injection therapy. This treatment involves an injection that causes the esophageal varices to shrink and close (sclerotherapy). Medicines that tighten blood vessels or alter blood flow may also be used.  Balloon tamponade. In this procedure, a tube is put into the esophagus and a balloon is passed through it and inflated.  Transjugular intrahepatic portosystemic shunt (TIPS) placement. In this procedure, a small tube is placed within the liver veins. This decreases blood flow and pressure to the esophageal varices.  A liver transplant. This may be done if other treatments do not work. HOME CARE INSTRUCTIONS  Take medicines only as directed by your health care provider.  Follow your health care provider's instructions about rest and physical activity. SEEK IMMEDIATE MEDICAL CARE IF:  You have any symptoms of this condition after treatment.  You are unable to eat or drink.  You have chest pain.   This information is not intended to replace advice given to you by your health care provider. Make sure you discuss any questions you have with your health care provider.   Document Released: 12/05/2003 Document Revised: 01/29/2015 Document Reviewed: 09/10/2014 Elsevier Interactive Patient Education 2016 [place]. Esophagogastroduodenoscopy Esophagogastroduodenoscopy (EGD) is a procedure that is used to examine the lining of the esophagus, stomach, and first part of the small intestine (duodenum). A long, flexible, lighted tube with a camera attached (endoscope) is inserted down the throat to view these organs. This procedure is done to detect problems or abnormalities, such as inflammation, bleeding, ulcers, or growths, in order to treat them. The procedure lasts 5-20 minutes. It is usually an outpatient procedure, but it may need to be performed in a hospital in emergency cases. LET [facility] CARE PROVIDER  KNOW ABOUT:  Any allergies you have.  All medicines you are taking, including vitamins, herbs, eye drops, creams, and over-the-counter medicines.  Previous problems you or members of your family have had with the use of anesthetics.  Any blood disorders you have.  Previous surgeries you have had.  Medical conditions you have. RISKS AND COMPLICATIONS Generally, this is a safe procedure. However, problems can occur and include:  Infection.  Bleeding.  Tearing (perforation) of the esophagus, stomach, or duodenum.  Difficulty breathing or not being able to breathe.  Excessive sweating.  Spasms of the larynx.  Slowed heartbeat.  Low blood pressure. BEFORE THE PROCEDURE  Do not eat or drink anything after midnight on the night before the procedure or as directed by your health care provider.  Do not take your regular medicines before the procedure if your health care provider asks you not to. Ask your health care provider about changing or stopping those medicines.  If you wear dentures, be prepared to remove them before the procedure.  Arrange for someone to drive you home after the procedure. PROCEDURE  A numbing medicine (local anesthetic) may be sprayed in your throat for comfort and to stop you from gagging or coughing.  You will have an IV tube inserted in a vein in your hand or arm. You will  receive medicines and fluids through this tube.  You will be given a medicine to relax you (sedative).  A pain reliever will be given through the IV tube.  A mouth guard may be placed in your mouth to protect your teeth and to keep you from biting on the endoscope.  You will be asked to lie on your left side.  The endoscope will be inserted down your throat and into your esophagus, stomach, and duodenum.  Air will be put through the endoscope to allow your health care provider to clearly view the lining of your esophagus.  The lining of your esophagus, stomach, and  duodenum will be examined. During the exam, your health care provider may:  Remove tissue to be examined under a microscope (biopsy) for inflammation, infection, or other medical problems.  Remove growths.  Remove objects (foreign bodies) that are stuck.  Treat any bleeding with medicines or other devices that stop tissues from bleeding (hot cautery, clipping devices).  Widen (dilate) or stretch narrowed areas of your esophagus and stomach.  The endoscope will be withdrawn. AFTER THE PROCEDURE  You will be taken to a recovery area for observation. Your blood pressure, heart rate, breathing rate, and blood oxygen level will be monitored often until the medicines you were given have worn off.  Do not eat or drink anything until the numbing medicine has worn off and your gag reflex has returned. You may choke.  Your health care provider should be able to discuss his or her findings with you. It will take longer to discuss the test results if any biopsies were taken.   This information is not intended to replace advice given to you by your health care provider. Make sure you discuss any questions you have with your health care provider.   Document Released: 01/15/2005 Document Revised: 10/05/2014 Document Reviewed: 08/17/2012 Elsevier Interactive Patient Education [employer].

## 2016-01-29 NOTE — Progress Notes (Signed)
Referring Provider: Newman Niplayton, Monica, NP Primary Care Physician:  Kirstie PeriSHAH,ASHISH, MD  Primary GI: Dr. Jena Gaussourk     Chief Complaint  Patient presents with  . Follow-up    HPI:   Joseph Cortez is a 50 y.o. male presenting today with a history of ETOH cirrhosis, EGD in Sept 2016 without varices. He was last seen March 2017 and had continued to drink. Concern for hepatic encephalopathy. Some depression noted. Interested in possible inpatient rehab. I spoke with his mother, with his approval, after his last office visit. At that time, she told me that he had been admitted to [facility] with a GI bleed. EGD 01/06/16 by Dr. Alycia RossettiKoch with portal gastropathy, duodenitis, 2 columns of large distal esophageal varices, one with flat red spot, s/p band ligation. His Hgb at discharge January 07, 2016 was 13.1, platelets 95. He had to receive a platelet infusion on admission, as his platelets were 9. He needs a 4 week surveillance EGD now. He was prescribed Nadolol, but he is unable to afford this. His sister is present today. States he has not had any alcohol for 3 weeks. Has occasional RLQ discomfort. Denies confusion. No lower extremity edema. No overt signs of GI bleeding. Multiple questions asked by sister and patient regarding the nature of liver disease, prognosis, care in future.    Past Medical History  Diagnosis Date  . Cirrhosis (HCC)   . Anxiety   . Depression   . Neuropathy (HCC)   . Neuromuscular disorder (HCC)     Neuropathy    Past Surgical History  Procedure Laterality Date  . Cholecystectomy    . Esophagogastroduodenoscopy (egd) with propofol N/A 06/27/2015    RUE:AVWURMR:mild erosive reflux esophagitis, portal gastropathy, duodenal bulbar ulcer, negative H.pylori. Surveillance due yearly according to ASGE current guidelines regarding ETOH cirrhosis and ongoing ETOH abuse  . Biopsy  06/27/2015    Procedure: BIOPSY (Gastric);  Surgeon: Corbin Adeobert M Rourk, MD;  Location: [facility];  Service: Endoscopy;;  .  Esophagogastroduodenoscopy  January 06, 2016    [facility]: duodenitis in bullb, portal gastropathy, 2 columns of large distal esophageal varices, one with flat red spot, s/p band ligation. Needs 4 week surveillance EGD     Current Outpatient Prescriptions  Medication Sig Dispense Refill  . clonazePAM (KLONOPIN) 0.5 MG tablet Take 0.5 mg by mouth daily.    . folic acid (FOLVITE) 1 MG tablet Take 1 mg by mouth.    . thiamine 100 MG tablet Take 100 mg by mouth.    . lactulose (CHRONULAC) 10 GM/15ML solution Take 30 mLs (20 g total) by mouth 2 (two) times daily. 1892 mL 5  . pantoprazole (PROTONIX) 40 MG tablet Take 1 tablet (40 mg total) by mouth 2 (two) times daily before a meal. 60 tablet 3  . propranolol (INDERAL) 20 MG tablet Take 1 tablet (20 mg total) by mouth 2 (two) times daily. 60 tablet 3   No current facility-administered medications for this visit.    Allergies as of 01/29/2016  . (No Known Allergies)    Family History  Problem Relation Age of Onset  . Colon cancer Maternal Uncle     Social History   Social History  . Marital Status: Divorced    Spouse Name: N/A  . Number of Children: N/A  . Years of Education: N/A   Social History Main Topics  . Smoking status: Current Every Day Smoker -- 0.50 packs/day    Types: Cigarettes  . Smokeless  tobacco: None  . Alcohol Use: 0.0 oz/week    0 Standard drinks or equivalent per week     Comment: Continued ETOH use  . Drug Use: No     Comment: cocaine historically, several years ago  . Sexual Activity: Not Asked   Other Topics Concern  . None   Social History Narrative    Review of Systems: Gen: see HPI  CV: Denies chest pain, palpitations, syncope, peripheral edema, and claudication. Resp: Denies dyspnea at rest, cough, wheezing, coughing up blood, and pleurisy. GI: see HPI  Derm: Denies rash, itching, dry skin Psych: Denies depression, anxiety, memory loss, confusion. No homicidal or suicidal ideation.    Heme: see HPI   Physical Exam: BP 123/81 mmHg  Pulse 74  Temp(Src) 97.9 F (36.6 C) (Oral)  Ht  (1.727 m)  Wt 235 lb 3.2 oz (106.686 kg)  BMI 35.77 kg/m2 General:   Alert and oriented. No distress noted. Pleasant and cooperative.  Head:  Normocephalic and atraumatic. Eyes:  Conjuctiva clear without scleral icterus. Mouth:  Oral mucosa pink and moist. Good dentition. No lesions. Heart:  S1, S2 present without murmurs, rubs, or gallops. Regular rate and rhythm. Abdomen:  +BS, soft, obese, liver grossly enlarged and easily palpable  Msk:  Symmetrical without gross deformities. Normal posture. Extremities:  Without edema. Neurologic:  Alert and  oriented x4;  grossly normal neurologically. Psych:  Alert and cooperative. Normal mood and affect.  CT January 05, 2016 [facility] 1. Evidence for cirrhosis of liver with portal hypertension and venous collaterals. 2. Indistinct pancreatic head and stranding in the retroperitoneum adjacent to the celiac axis and SMA. Correlate for possible pancreatitis. The entire pancreas enhances, so no current evidence to suggest necrosis. Differential diagnosis includes duodenitis along the medial aspect of the descending duodenum.. 3. Prominent perirectal collateral veins. Collapsed distal sigmoid colon so wall thickness not well assessed. 4. Additional findings as above

## 2016-01-29 NOTE — Assessment & Plan Note (Signed)
50 year old male with ETOH cirrhosis, [facility] inpatient [facility] with varice[facility]. EGD 01/06/16 by Dr. Alycia RossettiKoch with portal gastropathy, duodenitis, 2 columns of large distal esophageal varices, s/p band ligation. He was advised to take Nadolol, which is too expensive, as he does not have insurance. 4 week surveillance EGD now due for follow-up of varices and further banding as appropriate. He has abstained from ETOH for the past 3 weeks, which is encouraging. We discussed complete ETOH cessation from here forward. Will order CBC, CMP, INR now to calculate MELD. If he is able to abstain from ETOH going forward, we may be able to refer for transplant evaluation.   Proceed with upper endoscopy in the near future with Dr. Jena Gaussourk. The risks, benefits, and alternatives have been discussed in detail with patient. They have stated understanding and desire to proceed.  PROPOFOL due to history of ETOH abuse Increase lactulose to 30 ml po BID with goal of 3 soft BMs daily; not on Xifaxan, as he does not have insurance. Will reach out to patient assistance for this. Trial of Inderal 20 mg po BID to see if he tolerates this: although he is on the pathway for secondary variceal prophylaxis by way of endoscopic ligation, some studies have showed that EVL with beta-blocker therapy could decrease rebleeding rates Protonix po BID, sent to pharmacy CBC, CMP, INR today

## 2016-01-30 ENCOUNTER — Encounter (HOSPITAL_COMMUNITY): Payer: Self-pay | Admitting: *Deleted

## 2016-01-30 ENCOUNTER — Ambulatory Visit (HOSPITAL_COMMUNITY): Payer: Self-pay | Admitting: Anesthesiology

## 2016-01-30 ENCOUNTER — Encounter (HOSPITAL_COMMUNITY)
Admission: RE | Disposition: A | Discharge status: Home / Self Care | Payer: Self-pay | Source: Ambulatory Visit | Attending: Internal Medicine

## 2016-01-30 ENCOUNTER — Ambulatory Visit (HOSPITAL_COMMUNITY)
Admission: RE | Admit: 2016-01-30 | Discharge: 2016-01-30 | Disposition: A | Discharge status: Home / Self Care | Payer: Self-pay | Source: Ambulatory Visit | Attending: Internal Medicine | Admitting: Internal Medicine

## 2016-01-30 DIAGNOSIS — F418 Other specified anxiety disorders: Secondary | ICD-10-CM | POA: Insufficient documentation

## 2016-01-30 DIAGNOSIS — K3189 Other diseases of stomach and duodenum: Secondary | ICD-10-CM | POA: Insufficient documentation

## 2016-01-30 DIAGNOSIS — F17210 Nicotine dependence, cigarettes, uncomplicated: Secondary | ICD-10-CM | POA: Insufficient documentation

## 2016-01-30 DIAGNOSIS — K766 Portal hypertension: Secondary | ICD-10-CM | POA: Insufficient documentation

## 2016-01-30 DIAGNOSIS — Z79899 Other long term (current) drug therapy: Secondary | ICD-10-CM | POA: Insufficient documentation

## 2016-01-30 DIAGNOSIS — K7030 Alcoholic cirrhosis of liver without ascites: Secondary | ICD-10-CM | POA: Insufficient documentation

## 2016-01-30 DIAGNOSIS — I8500 Esophageal varices without bleeding: Secondary | ICD-10-CM | POA: Insufficient documentation

## 2016-01-30 HISTORY — PX: ESOPHAGOGASTRODUODENOSCOPY (EGD) WITH PROPOFOL: SHX5813

## 2016-01-30 HISTORY — PX: ESOPHAGEAL BANDING: SHX5518

## 2016-01-30 SURGERY — ESOPHAGOGASTRODUODENOSCOPY (EGD) WITH PROPOFOL
Anesthesia: Monitor Anesthesia Care

## 2016-01-30 MED ORDER — PROPOFOL 500 MG/50ML IV EMUL
INTRAVENOUS | Status: DC | PRN
  Administered 2016-01-30: 09:00:00 via INTRAVENOUS
  Administered 2016-01-30: 150 ug/kg/min via INTRAVENOUS

## 2016-01-30 MED ORDER — ONDANSETRON HCL 4 MG/2ML IJ SOLN: 4.0000 mg | Freq: Once | INTRAMUSCULAR | Status: DC | PRN

## 2016-01-30 MED ORDER — MIDAZOLAM HCL 2 MG/2ML IJ SOLN
1.0000 mg | INTRAMUSCULAR | Status: DC | PRN
  Administered 2016-01-30: 2 mg via INTRAVENOUS
  Filled 2016-01-30: qty 2

## 2016-01-30 MED ORDER — FENTANYL CITRATE (PF) 100 MCG/2ML IJ SOLN
INTRAMUSCULAR | Status: AC
  Filled 2016-01-30: qty 2

## 2016-01-30 MED ORDER — LIDOCAINE VISCOUS 2 % MT SOLN
OROMUCOSAL | Status: AC
  Filled 2016-01-30: qty 15

## 2016-01-30 MED ORDER — FENTANYL CITRATE (PF) 100 MCG/2ML IJ SOLN
25.0000 ug | INTRAMUSCULAR | Status: DC | PRN
  Administered 2016-01-30 (×2): 25 ug via INTRAVENOUS

## 2016-01-30 MED ORDER — PROPOFOL 10 MG/ML IV BOLUS
INTRAVENOUS | Status: AC
  Filled 2016-01-30: qty 40

## 2016-01-30 MED ORDER — LACTATED RINGERS IV SOLN
INTRAVENOUS | Status: DC
  Administered 2016-01-30: 1000 mL via INTRAVENOUS

## 2016-01-30 MED ORDER — LIDOCAINE VISCOUS 2 % MT SOLN
3.0000 mL | Freq: Two times a day (BID) | OROMUCOSAL | Status: AC | PRN
  Administered 2016-01-30 (×2): 3 mL via OROMUCOSAL

## 2016-01-30 MED ORDER — FENTANYL CITRATE (PF) 100 MCG/2ML IJ SOLN
25.0000 ug | INTRAMUSCULAR | Status: AC
  Administered 2016-01-30 (×2): 25 ug via INTRAVENOUS

## 2016-01-30 MED ORDER — ONDANSETRON HCL 4 MG/2ML IJ SOLN
INTRAMUSCULAR | Status: AC
  Filled 2016-01-30: qty 2

## 2016-01-30 NOTE — Discharge Instructions (Signed)
EGD Discharge instructions Please read the instructions outlined below and refer to this sheet in the next few weeks. These discharge instructions provide you with general information on caring for yourself after you leave the hospital. Your doctor may also give you specific instructions. While your treatment has been planned according to the most current medical practices available, unavoidable complications occasionally occur. If you have any problems or questions after discharge, please call your doctor. ACTIVITY  You may resume your regular activity but move at a slower pace for the next 24 hours.   Take frequent rest periods for the next 24 hours.   Walking will help expel (get rid of) the air and reduce the bloated feeling in your abdomen.   No driving for 24 hours (because of the anesthesia (medicine) used during the test).   You may shower.   Do not sign any important legal documents or operate any machinery for 24 hours (because of the anesthesia used during the test).  NUTRITION  Drink plenty of fluids.   You may resume your normal diet.   Begin with a light meal and progress to your normal diet.   Avoid alcoholic beverages for 24 hours or as instructed by your caregiver.  MEDICATIONS  You may resume your normal medications unless your caregiver tells you otherwise.  WHAT YOU CAN EXPECT TODAY  You may experience abdominal discomfort such as a feeling of fullness or gas pains.  FOLLOW-UP  Your doctor will discuss the results of your test with you.  SEEK IMMEDIATE MEDICAL ATTENTION IF ANY OF THE FOLLOWING OCCUR:  Excessive nausea (feeling sick to your stomach) and/or vomiting.   Severe abdominal pain and distention (swelling).   Trouble swallowing.   Temperature over 101 F (37.8 C).   Rectal bleeding or vomiting of blood.   Office visit with us in 3 months  Recommend repeat EGD in 1 year

## 2016-01-30 NOTE — H&P (View-Only) (Signed)
Patient agreed to me contacting his mother. I spoke with Rosalene BillingsLinda Norris, his mom. She stated [facility] with [facility]em[facility]. Appears they did an upper endoscopy. CT scan showed cirrhosis, possible pancreatitis, prominent perirectal collateral veins.    Darl PikesSusan: can we get the procedure notes from the EGD [facility]?

## 2016-01-30 NOTE — Interval H&P Note (Signed)
History and Physical Interval Note:  01/30/2016 8:47 AM  Joseph Cortez  has presented today for surgery, with the diagnosis of cirrhosis  The various methods of treatment have been discussed with the patient and family. After consideration of risks, benefits and other options for treatment, the patient has consented to  Procedure(s) with comments: ESOPHAGOGASTRODUODENOSCOPY (EGD) WITH PROPOFOL (N/A) - 0930 ESOPHAGEAL BANDING (N/A) as a surgical intervention .  The patient's history has been reviewed, patient examined, no change in status, stable for surgery.  I have reviewed the patient's chart and labs.  Questions were answered to the patient's satisfaction.     Joseph Cortez  No change. EGD with possible esophageal band ligation per plan.The risks, benefits, limitations, alternatives and imponderables have been reviewed with the patient. Potential for esophageal dilation, biopsy, etc. have also been reviewed.  Questions have been answered. All parties agreeable.

## 2016-01-30 NOTE — Anesthesia Preprocedure Evaluation (Deleted)
Anesthesia Evaluation    Airway       Dental   Pulmonary Current Smoker,          Cardiovascular     Neuro/Psych    GI/Hepatic   Endo/Other    Renal/GU      Musculoskeletal   Abdominal   Peds  Hematology   Anesthesia Other Findings   Reproductive/Obstetrics                           Anesthesia Physical Anesthesia Plan Anesthesia Quick Evaluation  

## 2016-01-30 NOTE — Anesthesia Preprocedure Evaluation (Signed)
Anesthesia Evaluation  Patient identified by MRN, date of birth, ID band Patient awake    Reviewed: Allergy & Precautions, NPO status , Patient's Chart, lab work & pertinent test results  Airway Mallampati: II  TM Distance: >3 FB     Dental  (+) Teeth Intact, Dental Advisory Given   Pulmonary Current Smoker,    breath sounds clear to auscultation       Cardiovascular negative cardio ROS   Rhythm:Regular Rate:Normal     Neuro/Psych PSYCHIATRIC DISORDERS Anxiety Depression  Neuromuscular disease    GI/Hepatic (+) Cirrhosis   Esophageal Varices  substance abuse  alcohol use,   Endo/Other    Renal/GU      Musculoskeletal   Abdominal   Peds  Hematology   Anesthesia Other Findings   Reproductive/Obstetrics                             Anesthesia Physical Anesthesia Plan  ASA: III  Anesthesia Plan: MAC   Post-op Pain Management:    Induction: Intravenous  Airway Management Planned:   Additional Equipment:   Intra-op Plan:   Post-operative Plan:   Informed Consent: I have reviewed the patients History and Physical, chart, labs and discussed the procedure including the risks, benefits and alternatives for the proposed anesthesia with the patient or authorized representative who has indicated his/her understanding and acceptance.     Plan Discussed with:   Anesthesia Plan Comments:         Anesthesia Quick Evaluation

## 2016-01-30 NOTE — Transfer of Care (Signed)
Immediate Anesthesia Transfer of Care Note  Patient: Cristopher EstimableMichael Staubs  Procedure(s) Performed: Procedure(s) with comments: ESOPHAGOGASTRODUODENOSCOPY (EGD) WITH PROPOFOL (N/A) - 0930 ESOPHAGEAL BANDING (N/A)  Patient Location: PACU  Anesthesia Type:MAC  Level of Consciousness: awake and alert   Airway & Oxygen Therapy: Patient Spontanous Breathing and Patient connected to face mask oxygen  Post-op Assessment: Report given to RN  Post vital signs: Reviewed and stable  Last Vitals:  Filed Vitals:   01/30/16 0900 01/30/16 0905  BP: 121/77 120/74  Temp:    Resp: 15 14    Last Pain:  Filed Vitals:   01/30/16 0906  PainSc: 8       Patients Stated Pain Goal: 9 (01/30/16 0915)  Complications: No apparent anesthesia complications

## 2016-01-30 NOTE — Op Note (Signed)
[facility] Patient Name: Joseph Cortez Procedure Date: 01/30/2016 9:05 AM MRN: [id number] Date of Birth: 06-17-1966 Attending MD: Gennette Pac , MD CSN: [id number] Age: 50 Admit Type: Outpatient Procedure:                Upper GI endoscopy with EBL Indications:              Esophageal varices Providers:                Gennette Pac, MD, Nena Polio, RN, Burke Keels, Technician Referring MD:              Medicines:                Monitored Anesthesia Care Complications:            No immediate complications. Estimated Blood Loss:     Estimated blood loss: none. Procedure:                Pre-Anesthesia Assessment:                           - Prior to the procedure, a History and Physical                            was performed, and patient medications and                            allergies were reviewed. The patient's tolerance of                            previous anesthesia was also reviewed. The risks                            and benefits of the procedure and the sedation                            options and risks were discussed with the patient.                            All questions were answered, and informed consent                            was obtained. ASA Grade Assessment: III - A patient                            with severe systemic disease. After reviewing the                            risks and benefits, the patient was deemed in                            satisfactory condition to undergo the procedure.  After obtaining informed consent, the endoscope was                            passed under direct vision. Throughout the                            procedure, the patient's blood pressure, pulse, and                            oxygen saturations were monitored continuously. The                            [id number] ([id number]) scope was introduced through the                             mouth, and advanced to the second part of duodenum.                            The upper GI endoscopy was accomplished without                            difficulty. The patient tolerated the procedure                            well. Scope In: 9:18:58 AM Scope Out: 9:31:00 AM Total Procedure Duration: 0 hours 12 minutes 2 seconds  Findings:      Grade II varices were found in the lower third of the esophagus. scar       present from recent band.      Portal hypertensive gastropathy was found in the entire examined stomach.      The second portion of the duodenum was normal.      The exam was otherwise without abnormality. Five bands were successfully       placed with complete eradication, resulting in deflation of varices.       There was no bleeding during the maneuver. Estimated blood loss: none. Impression:               - Grade II esophageal varices. Completely                            eradicated. Banded.                           - Portal hypertensive gastropathy.                           - Normal second portion of the duodenum.                           - The examination was otherwise normal.                           - No specimens collected. Moderate Sedation:      Moderate (conscious) sedation was administered by the endoscopy nurse       and supervised by the endoscopist. The following parameters  were       monitored: oxygen saturation, heart rate, blood pressure, respiratory       rate, EKG, adequacy of pulmonary ventilation, and response to care.       Total physician intraservice time was 15 minutes. Recommendation:           - Patient has a contact number available for                            emergencies. The signs and symptoms of potential                            delayed complications were discussed with the                            patient. Return to normal activities tomorrow.                            Written discharge instructions were provided to the                             patient.                           - Advance diet as tolerated.                           - Continue present medications.                           - Return to GI clinic in 3 months. Repeat                            surveillance EGD in 1 year Procedure Code(s):        --- Professional ---                           [id number], Esophagogastroduodenoscopy, flexible,                            transoral; with band ligation of esophageal/gastric                            varices                           99152, Moderate sedation services provided by the                            same physician or other qualified health care                            professional performing the diagnostic or                            therapeutic service that the sedation supports,  requiring the presence of an independent trained                            observer to assist in the monitoring of the                            patient's level of consciousness and physiological                            status; initial 15 minutes of intraservice time,                            patient age 94 years or older Diagnosis Code(s):        --- Professional ---                           I85.00, Esophageal varices without bleeding                           K76.6, Portal hypertension                           K31.89, Other diseases of stomach and duodenum CPT copyright 2016 American Medical Association. All rights reserved. The codes documented in this report are preliminary and upon coder review may  be revised to meet current compliance requirements. Gerrit Friendsobert M. Rourk, MD Gennette Pacobert Kindred Rourk, MD 01/30/2016 9:42:45 AM This report has been signed electronically. Number of Addenda: 0

## 2016-01-30 NOTE — Progress Notes (Signed)
cc'ed to pcp °

## 2016-01-30 NOTE — Anesthesia Postprocedure Evaluation (Signed)
Anesthesia Post Note  Patient: Joseph Cortez  Procedure(s) Performed: Procedure(s) (LRB): ESOPHAGOGASTRODUODENOSCOPY (EGD) WITH PROPOFOL (N/A) ESOPHAGEAL BANDING (N/A)  Patient location during evaluation: PACU Anesthesia Type: MAC Level of consciousness: awake and alert Pain management: pain level controlled Vital Signs Assessment: post-procedure vital signs reviewed and stable Respiratory status: spontaneous breathing Cardiovascular status: blood pressure returned to baseline Postop Assessment: no signs of nausea or vomiting Anesthetic complications: no    Last Vitals:  Filed Vitals:   01/30/16 0905 01/30/16 0945  BP: 120/74 146/102  Pulse:  74  Temp:  36.4 C  Resp: 14 14    Last Pain:  Filed Vitals:   01/30/16 0953  PainSc: 0-No pain                 James Senn

## 2016-01-31 LAB — COMPLETE METABOLIC PANEL WITH GFR

## 2016-02-03 ENCOUNTER — Telehealth: Payer: Self-pay | Admitting: Internal Medicine

## 2016-02-03 ENCOUNTER — Encounter (HOSPITAL_COMMUNITY): Payer: Self-pay | Admitting: Internal Medicine

## 2016-02-03 NOTE — Telephone Encounter (Signed)
Spoke with the pt, he said he doesn't have the pain all the time. It comes and goes. It hurts in his esophagus when he eats certain things (like a hamburger) and sometimes when he breathes. He said he has had this done before and he doesn't remember it taking this long to get over it.   He also is concerned about his lactulose. He said he normally has 1-2 bms a day but he stopped taking the milk thistle last week after Tobi Bastosnna told him to and yesterday he didn't have a bm at all and today he has had 2 hard stools.   1)He wants to know if there is anything he can take to help the pain in his esophagus and 2) what he should do about the lactulose?

## 2016-02-03 NOTE — Telephone Encounter (Signed)
PATIENT STATES SOMETIMES WHEN HE BREATHES HIS STERNUM AREA HURTS, DOES SAME SOMETIMES AFTER HE EATS.  WANTS TO KNOW IF THAT'S NORMAL,  ALSO WANTS TO SPEAK ABOUT LACTOLOSE TO GO TO THE BATHROOM.  [phone]

## 2016-02-03 NOTE — Telephone Encounter (Signed)
Although milk thistle has been felt to help with fibrosis, studies are mixed regarding efficacy. In this scenario, I do not want him taking anything over the counter that is not prescribed, simply because it would be easy to get a hold of the "wrong" thing.  As for lactulose: I had increased this to 30 ml BID. He may increase this to 30 ml TID or even 45 ml BID to TID. The goal is 3 soft bowel movements a day. I am very proud of him for realizing this is an issue and asking us about it.   As for the sternal discomfort after eating: he may be having atypical GERD symptoms, but if he has associated shortness of breath, diaphoresis, radiating pain, etc., needs to seek emergency attention. Sounds that some of it is GI related. He is on Protonix BID. We can trial Dexilant and have him fill out patient assistance forms. He would need to come get samples. He does not have insurance coverage.   I am not sure why he has the discomfort with breathing. Would go to PCP about that.

## 2016-02-04 ENCOUNTER — Other Ambulatory Visit: Payer: Self-pay | Admitting: Gastroenterology

## 2016-02-04 LAB — COMPREHENSIVE METABOLIC PANEL
ALBUMIN: 4.1 g/dL (ref 3.6–5.1)
ALT: 22 U/L (ref 9–46)
AST: 41 U/L — ABNORMAL HIGH (ref 10–40)
Alkaline Phosphatase: 99 U/L (ref 40–115)
BUN: 7 mg/dL (ref 7–25)
CHLORIDE: 103 mmol/L (ref 98–110)
CO2: 25 mmol/L (ref 20–31)
CREATININE: 0.7 mg/dL (ref 0.60–1.35)
Calcium: 9.4 mg/dL (ref 8.6–10.3)
Glucose, Bld: 88 mg/dL (ref 65–99)
POTASSIUM: 4.6 mmol/L (ref 3.5–5.3)
SODIUM: 138 mmol/L (ref 135–146)
TOTAL PROTEIN: 7.6 g/dL (ref 6.1–8.1)
Total Bilirubin: 1.2 mg/dL (ref 0.2–1.2)

## 2016-02-04 NOTE — Telephone Encounter (Signed)
Pt came by the office and is aware of recommendations. I have given him pt assistance forms for dexilant and xifaxan (see ov note) and went over instructions for lactulose. I typed a letter with all of these instructions and have given it to the pt.  He got a call from the lab, he had to go back to day and have the cmet re-drawn because they didn't get enough blood the first time.

## 2016-02-11 NOTE — Progress Notes (Signed)
Quick Note:  Unable to calculate MELD as CMP was cancelled. I believe he will be completing this again. INR has improved. Hgb normal. ______

## 2016-02-11 NOTE — Progress Notes (Signed)
Quick Note:  LFTs look so much better! I can tell he has stopped drinking. Only AST is mildly elevated at 41. GREAT JOB! ______

## 2016-02-12 ENCOUNTER — Telehealth: Payer: Self-pay | Admitting: Internal Medicine

## 2016-02-12 NOTE — Telephone Encounter (Signed)
Pt called this afternoon to say that the medicine he was put on he took for 5 days and didn't seem to help, so he taking protonix again. He had several other questions regarding his heart rate being low and asking if we would refer him to a neurologist regarding his feet. I told him that sounded like something his PCP would have to do, but I would let the nurse be aware that he had called. [phone]

## 2016-02-18 NOTE — Telephone Encounter (Signed)
Tried to call pt- NA 

## 2016-02-20 NOTE — Telephone Encounter (Signed)
Decrease inderal to 20 mg once daily. He may not tolerate beta blocker therapy. If he has any further dizziness, hypotension, needs to call us. Monitor for overt GI bleeding. Sounds like side effect from inderal.

## 2016-02-20 NOTE — Telephone Encounter (Signed)
I spoke with the Joseph Cortez, he said his pulse was staying around 72-76 but his blood pressure is low. He is weak and had a couple of episodes of dizziness. He wants to know if there is anything he should do?     He also asked if we knew where he could get his prescriptions cheaper since he doesn't have rx insurance. I advised him that I would send him some of the rx discount cards that we have in the office. I have put 3 different discount cards in the mail to the Joseph Cortez.

## 2016-02-21 NOTE — Telephone Encounter (Signed)
Pt is aware.  

## 2016-03-02 ENCOUNTER — Telehealth: Payer: Self-pay

## 2016-03-02 NOTE — Telephone Encounter (Signed)
He should be taking 30 ml BID. Go ahead and increase it to 3 times per day, starting tomorrow. For today: take an additional 30 ml right now and repeat again in 2 hours. If no results, wait another hour and take another 30 ml. Call tomorrow with an update.

## 2016-03-02 NOTE — Telephone Encounter (Signed)
Pt is aware of recommendations

## 2016-03-02 NOTE — Telephone Encounter (Signed)
Pt called- he is taking lactulose as prescribed and he has not had a bm in 2 days. He wants to know if he can use a laxative or stool softener?

## 2016-03-12 ENCOUNTER — Ambulatory Visit (INDEPENDENT_AMBULATORY_CARE_PROVIDER_SITE_OTHER): Payer: Self-pay | Admitting: Gastroenterology

## 2016-03-12 ENCOUNTER — Other Ambulatory Visit: Payer: Self-pay

## 2016-03-12 ENCOUNTER — Other Ambulatory Visit (HOSPITAL_COMMUNITY)
Admission: RE | Admit: 2016-03-12 | Discharge: 2016-03-12 | Disposition: A | Discharge status: Home / Self Care | Payer: Self-pay | Source: Ambulatory Visit | Attending: Gastroenterology | Admitting: Gastroenterology

## 2016-03-12 ENCOUNTER — Encounter: Payer: Self-pay | Admitting: Gastroenterology

## 2016-03-12 VITALS — BP 113/71 | HR 84 | Temp 97.3°F | Ht 68.0 in | Wt 253.8 lb

## 2016-03-12 DIAGNOSIS — F1010 Alcohol abuse, uncomplicated: Secondary | ICD-10-CM

## 2016-03-12 DIAGNOSIS — K7030 Alcoholic cirrhosis of liver without ascites: Secondary | ICD-10-CM

## 2016-03-12 DIAGNOSIS — F603 Borderline personality disorder: Secondary | ICD-10-CM

## 2016-03-12 LAB — COMPREHENSIVE METABOLIC PANEL
ALT: 21 U/L (ref 17–63)
AST: 46 U/L — AB (ref 15–41)
Albumin: 3.7 g/dL (ref 3.5–5.0)
Alkaline Phosphatase: 88 U/L (ref 38–126)
Anion gap: 5 (ref 5–15)
CHLORIDE: 106 mmol/L (ref 101–111)
CO2: 26 mmol/L (ref 22–32)
CREATININE: 0.66 mg/dL (ref 0.61–1.24)
Calcium: 8.6 mg/dL — ABNORMAL LOW (ref 8.9–10.3)
GFR calc Af Amer: >60 mL/min (ref >60)
Glucose, Bld: 119 mg/dL — ABNORMAL HIGH (ref 65–99)
Potassium: 3.8 mmol/L (ref 3.5–5.1)
Sodium: 137 mmol/L (ref 135–145)
Total Bilirubin: 1.1 mg/dL (ref 0.3–1.2)
Total Protein: 7.2 g/dL (ref 6.5–8.1)

## 2016-03-12 LAB — PROTIME-INR
INR: 1.33 (ref 0.00–1.49)
PROTHROMBIN TIME: 16.6 s — AB (ref 11.6–15.2)

## 2016-03-12 MED ORDER — FUROSEMIDE 20 MG PO TABS: 20.0000 mg | ORAL_TABLET | Freq: Every day | ORAL | Status: DC

## 2016-03-12 MED ORDER — SPIRONOLACTONE 50 MG PO TABS: 50.0000 mg | ORAL_TABLET | Freq: Every day | ORAL | Status: DC

## 2016-03-12 NOTE — Progress Notes (Signed)
cc'ed to pcp °

## 2016-03-12 NOTE — Assessment & Plan Note (Addendum)
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 Sister and daughter present today. Recent EGD up-to-date with Grade 2 varices s/p banding. Remains on propanolol once daily as he was unable to tolerate BID dosing: next EGD in 1 year. Obvious lower extremity edema but non-tense ascites. I spent at least an hour with patient and his family members discussing poor outcome if persistent drinking. No rehab facilities available without paying out of pocket. He may go to [place] and stay for 4-5 days without having to pay, but this is only a short-term "fix" and I doubt will be beneficial in the long run. He is also non-compliant with a low sodium diet. Multiple questions answered today.   1. CMP, INR stat now  2. Start lasix 20 mg and aldactone 50 mg daily. Recheck BMP in 1 week. May need to increase therapy. Will avoid paracentesis as of now as he does not have tense ascites, although I anticipate this may be happening in the future.  3. Lactulose as directed for 3 soft BMs daily 4. Xifaxan paperwork provided previously: I have asked him to complete this 5. Strict adherence to 2 gram sodium diet  6. Referral to psychiatry here locally for close evaluation. Likely will need weekly visits 7. Return in 4 weeks

## 2016-03-12 NOTE — Progress Notes (Signed)
Quick Note:  MELD 10. Renal function good. Starting lasix and aldactone today. Let's repeat BMP in 1 week. We may need to increase dosage. ______

## 2016-03-12 NOTE — Patient Instructions (Addendum)
Please go to the hospital (go in to admissions) and tell them you are there for blood work. I have given you the orders.   Start taking lasix 20 mg and aldactone 50 mg each morning.   The rehab facilities around here require out of pocket payment if no insurance, unfortunately.   Continue propanolol once each morning for now.   Follow a low sodium diet, not exceeding 2 grams daily. I have attached a sheet here.   Take lactulose with a goal of 3 soft bowel movements daily.   We will see you in 4 weeks. In the meantime, I am referring you to a  psychiatrist here.     Low-Sodium Eating Plan Sodium raises blood pressure and causes water to be held in the body. Getting less sodium from food will help lower your blood pressure, reduce any swelling, and protect your heart, liver, and kidneys. We get sodium by adding salt (sodium chloride) to food. Most of our sodium comes from canned, boxed, and frozen foods. Restaurant foods, fast foods, and pizza are also very high in sodium. Even if you take medicine to lower your blood pressure or to reduce fluid in your body, getting less sodium from your food is important. WHAT IS MY PLAN? Most people should limit their sodium intake to 2,300 mg a day. Your health care provider recommends that you limit your sodium intake to  2 grams  a day.  WHAT DO I NEED TO KNOW ABOUT THIS EATING PLAN? For the low-sodium eating plan, you will follow these general guidelines:  Choose foods with a % Daily Value for sodium of less than 5% (as listed on the food label).   Use salt-free seasonings or herbs instead of table salt or sea salt.   Check with your health care provider or pharmacist before using salt substitutes.   Eat fresh foods.  Eat more vegetables and fruits.  Limit canned vegetables. If you do use them, rinse them well to decrease the sodium.   Limit cheese to 1 oz (28 g) per day.   Eat lower-sodium products, often labeled as "lower sodium" or  "no salt added."  Avoid foods that contain monosodium glutamate (MSG). MSG is sometimes added to [place] food and some canned foods.  Check food labels (Nutrition Facts labels) on foods to learn how much sodium is in one serving.  Eat more home-cooked food and less restaurant, buffet, and fast food.  When eating at a restaurant, ask that your food be prepared with less salt, or no salt if possible.  HOW DO I READ FOOD LABELS FOR SODIUM INFORMATION? The Nutrition Facts label lists the amount of sodium in one serving of the food. If you eat more than one serving, you must multiply the listed amount of sodium by the number of servings. Food labels may also identify foods as:  Sodium free--Less than 5 mg in a serving.  Very low sodium--35 mg or less in a serving.  Low sodium--140 mg or less in a serving.  Light in sodium--50% less sodium in a serving. For example, if a food that usually has 300 mg of sodium is changed to become light in sodium, it will have 150 mg of sodium.  Reduced sodium--25% less sodium in a serving. For example, if a food that usually has 400 mg of sodium is changed to reduced sodium, it will have 300 mg of sodium. WHAT FOODS CAN I EAT? Grains Low-sodium cereals, including oats, puffed wheat and rice,  and shredded wheat cereals. Low-sodium crackers. Unsalted rice and pasta. Lower-sodium bread.  Vegetables Frozen or fresh vegetables. Low-sodium or reduced-sodium canned vegetables. Low-sodium or reduced-sodium tomato sauce and paste. Low-sodium or reduced-sodium tomato and vegetable juices.  Fruits Fresh, frozen, and canned fruit. Fruit juice.  Meat and Other Protein Products Low-sodium canned tuna and salmon. Fresh or frozen meat, poultry, seafood, and fish. Lamb. Unsalted nuts. Dried beans, peas, and lentils without added salt. Unsalted canned beans. Homemade soups without salt. Eggs.  Dairy Milk. Soy milk. Ricotta cheese. Low-sodium or reduced-sodium  cheeses. Yogurt.  Condiments Fresh and dried herbs and spices. Salt-free seasonings. Onion and garlic powders. Low-sodium varieties of mustard and ketchup. Fresh or refrigerated horseradish. Lemon juice.  Fats and Oils Reduced-sodium salad dressings. Unsalted butter.  Other Unsalted popcorn and pretzels.  The items listed above may not be a complete list of recommended foods or beverages. Contact your dietitian for more options. WHAT FOODS ARE NOT RECOMMENDED? Grains Instant hot cereals. Bread stuffing, pancake, and biscuit mixes. Croutons. Seasoned rice or pasta mixes. Noodle soup cups. Boxed or frozen macaroni and cheese. Self-rising flour. Regular salted crackers. Vegetables Regular canned vegetables. Regular canned tomato sauce and paste. Regular tomato and vegetable juices. Frozen vegetables in sauces. Salted [place] fries. Olives. Rosita Fire. Relishes. Sauerkraut. Salsa. Meat and Other Protein Products Salted, canned, smoked, spiced, or pickled meats, seafood, or fish. Bacon, ham, sausage, hot dogs, corned beef, chipped beef, and packaged luncheon meats. Salt pork. Jerky. Pickled herring. Anchovies, regular canned tuna, and sardines. Salted nuts. Dairy Processed cheese and cheese spreads. Cheese curds. Blue cheese and cottage cheese. Buttermilk.  Condiments Onion and garlic salt, seasoned salt, table salt, and sea salt. Canned and packaged gravies. Worcestershire sauce. Tartar sauce. Barbecue sauce. Teriyaki sauce. Soy sauce, including reduced sodium. Steak sauce. Fish sauce. Oyster sauce. Cocktail sauce. Horseradish that you find on the shelf. Regular ketchup and mustard. Meat flavorings and tenderizers. Bouillon cubes. Hot sauce. Tabasco sauce. Marinades. Taco seasonings. Relishes. Fats and Oils Regular salad dressings. Salted butter. Margarine. Ghee. Bacon fat.  Other Potato and tortilla chips. Corn chips and puffs. Salted popcorn and pretzels. Canned or dried soups. Pizza.  Frozen entrees and pot pies.  The items listed above may not be a complete list of foods and beverages to avoid. Contact your dietitian for more information.   This information is not intended to replace advice given to you by your health care provider. Make sure you discuss any questions you have with your health care provider.   Document Released: 03/06/2002 Document Revised: 10/05/2014 Document Reviewed: 07/19/2013 Elsevier Interactive Patient Education [employer]

## 2016-03-12 NOTE — Progress Notes (Signed)
Referring Provider: Newman Nip, NP Primary Care Physician:  Kirstie Peri, MD  Primary GI: Dr. Jena Gauss   Chief Complaint  Patient presents with  . Follow-up    HPI:   Joseph Cortez is a 50 y.o. male presenting today with a history of ETOH cirhosis, GI bleed secondary to varices in April 2017 at [facility] s/p banding, with recent surveillance noting Grade 2 varices s/p banding with complete deflation of varices. Portal gastropathy. Inderal 20 mg BID.   Confused at times. Sister present. Having issues with short-term memory. Sometimes thinks things are 2-3 days ago when it's only been a day.   Sister present, Joseph Cortez, and daughter, who is 68, named Joseph Cortez. 2 or 3/5ths of wine a day. Has a psychiatrist with [facility]. Received news on Monday that was hard to handle and resumed drinking.   Lower extremity edema noted, pitting edema. Abdomen feels more swollen but not tense. No overt GI bleeding or abdominal pain. Taking propanolol once a day.   Past Medical History  Diagnosis Date  . Cirrhosis (HCC)   . Anxiety   . Depression   . Neuropathy (HCC)   . Neuromuscular disorder (HCC)     Neuropathy    Past Surgical History  Procedure Laterality Date  . Cholecystectomy    . Esophagogastroduodenoscopy (egd) with propofol N/A 06/27/2015    [facility]:AOZH erosive reflux esophagitis, portal gastropathy, duodenal bulbar ulcer, negative H.pylori. Surveillance due yearly according to ASGE current guidelines regarding ETOH cirrhosis and ongoing ETOH abuse  . Biopsy  06/27/2015    Procedure: BIOPSY (Gastric);  Surgeon: Corbin Ade, MD;  Location: [facility] ORS;  Service: Endoscopy;;  . Esophagogastroduodenoscopy  January 06, 2016    Dr. Alycia Rossetti at [facility]: duodenitis in bullb, portal gastropathy, 2 columns of large distal esophageal varices, one with flat red spot, s/p band ligation. Needs 4 week surveillance EGD   . Esophagogastroduodenoscopy (egd) with propofol N/A 01/30/2016   Dr. Jena Gauss: Grade 2 varices s/p banding with complete deflation of varices, portal gastropathy, surveillance in May 2018  . Esophageal banding N/A 01/30/2016    Procedure: ESOPHAGEAL BANDING;  Surgeon: Corbin Ade, MD;  Location: [facility] ENDO SUITE;  Service: Endoscopy;  Laterality: N/A;    Current Outpatient Prescriptions  Medication Sig Dispense Refill  . clonazePAM (KLONOPIN) 0.5 MG tablet Take 0.5 mg by mouth daily.    Marland Kitchen lactulose (CHRONULAC) 10 GM/15ML solution Take 30 mLs (20 g total) by mouth 2 (two) times daily. 1892 mL 5  . pantoprazole (PROTONIX) 40 MG tablet Take 1 tablet (40 mg total) by mouth 2 (two) times daily before a meal. 60 tablet 3  . propranolol (INDERAL) 20 MG tablet Take 1 tablet (20 mg total) by mouth 2 (two) times daily. 60 tablet 3  . thiamine 100 MG tablet Take 100 mg by mouth.    . folic acid (FOLVITE) 1 MG tablet Take 1 mg by mouth. Reported on 03/12/2016     No current facility-administered medications for this visit.    Allergies as of 03/12/2016  . (No Known Allergies)    Family History  Problem Relation Age of Onset  . Colon cancer Maternal Uncle     Social History   Social History  . Marital Status: Divorced    Spouse Name: N/A  . Number of Children: N/A  . Years of Education: N/A   Social History Main Topics  . Smoking status: Current Every Day Smoker -- 0.50 packs/day    Types: Cigarettes  .  Smokeless tobacco: None  . Alcohol Use: 0.0 oz/week    0 Standard drinks or equivalent per week     Comment: Continued ETOH use  . Drug Use: No     Comment: cocaine historically, several years ago  . Sexual Activity: Not Asked   Other Topics Concern  . None   Social History Narrative    Review of Systems: As mentioned in HPI   Physical Exam: BP 113/71 mmHg  Pulse 84  Temp(Src) 97.3 F (36.3 C) (Oral)  Ht 5\' 8"  (1.727 m)  Wt 253 lb 12.8 oz (115.123 kg)  BMI 38.60 kg/m2 General:   Alert and oriented. No distress noted. Pleasant and  cooperative.  Head:  Normocephalic and atraumatic. Eyes:  Conjuctiva clear without scleral icterus. Mouth:  Oral mucosa pink and moist. Good dentition. No lesions. Abdomen:  +BS, distended but soft, no rebound or guarding, non-tense ascites  Msk:  Symmetrical without gross deformities. Normal posture. Extremities:  2+ pitting lower extremity edema, ankle and pedal edema  Neurologic:  Alert and  oriented x4 Psych:  Alert and cooperative. Normal mood and affect.

## 2016-03-13 MED ORDER — FOLIC ACID 1 MG PO TABS: 1.0000 mg | ORAL_TABLET | Freq: Every day | ORAL | Status: DC

## 2016-03-16 ENCOUNTER — Other Ambulatory Visit: Payer: Self-pay | Admitting: Gastroenterology

## 2016-03-16 ENCOUNTER — Other Ambulatory Visit: Payer: Self-pay

## 2016-03-16 DIAGNOSIS — K7030 Alcoholic cirrhosis of liver without ascites: Secondary | ICD-10-CM

## 2016-03-20 ENCOUNTER — Telehealth: Payer: Self-pay | Admitting: Internal Medicine

## 2016-03-20 ENCOUNTER — Other Ambulatory Visit (HOSPITAL_COMMUNITY)
Admission: RE | Admit: 2016-03-20 | Discharge: 2016-03-20 | Disposition: A | Discharge status: Home / Self Care | Payer: Self-pay | Source: Ambulatory Visit | Attending: Gastroenterology | Admitting: Gastroenterology

## 2016-03-20 DIAGNOSIS — K7030 Alcoholic cirrhosis of liver without ascites: Secondary | ICD-10-CM | POA: Insufficient documentation

## 2016-03-20 LAB — BASIC METABOLIC PANEL
ANION GAP: 6 (ref 5–15)
BUN: 8 mg/dL (ref 6–20)
CHLORIDE: 102 mmol/L (ref 101–111)
CO2: 26 mmol/L (ref 22–32)
Calcium: 8.8 mg/dL — ABNORMAL LOW (ref 8.9–10.3)
Creatinine, Ser: 0.78 mg/dL (ref 0.61–1.24)
Glucose, Bld: 135 mg/dL — ABNORMAL HIGH (ref 65–99)
POTASSIUM: 4.4 mmol/L (ref 3.5–5.1)
SODIUM: 134 mmol/L — AB (ref 135–145)

## 2016-03-20 NOTE — Telephone Encounter (Signed)
Pt and wife stopped by front window dropping off papers for [facility] and was asking for a lab order to take with him today to get his labs done. I printed off order and gave to the wife and then she was asking about a referral that was going to be made for him to see someone in mental health. I told her once referral was made that someone would be in touch with them

## 2016-03-23 ENCOUNTER — Telehealth (HOSPITAL_COMMUNITY): Payer: Self-pay | Admitting: *Deleted

## 2016-03-23 NOTE — Telephone Encounter (Signed)
Office received ref from [facility] for pt to sch new pt appt. Per provider, she recommend pt to do ETOH rehab first before moving forward. Called ref office and spoke with Ginger and informed her of this and she verbalized understanding and stated oh okay.

## 2016-03-23 NOTE — Telephone Encounter (Signed)
Ginger, have you done referral?

## 2016-03-23 NOTE — Telephone Encounter (Signed)
Yes will follow up with them today

## 2016-03-23 NOTE — Telephone Encounter (Signed)
Behavioral Health called and said that they would like for him to go to ETOH rehab first before they would see him.

## 2016-03-25 NOTE — Progress Notes (Signed)
Quick Note:  Continue lasix 20 mg and aldactone 50 mg daily. His renal function is doing well with this, and his sodium is only marginally lower than normal at 134. No concerning features. Will follow-up in 4 weeks. ______

## 2016-03-25 NOTE — Telephone Encounter (Signed)
Routing to Ginger.  

## 2016-03-25 NOTE — Telephone Encounter (Signed)
This is so unfortunate, because he does not have insurance. Is there any place that will take him for rehab WITHOUT insurance? We need to let them know this.

## 2016-03-27 NOTE — Telephone Encounter (Signed)
Spoke with Wes at [facility] and he is going to reach out to the patient for appointment.

## 2016-04-01 ENCOUNTER — Telehealth: Payer: Self-pay | Admitting: Gastroenterology

## 2016-04-01 NOTE — Telephone Encounter (Signed)
I received a note on my desk that appears to be handwritten. I am unsure who wrote this, but I assume it was from one of Maveryk's family members, most likely his sister, who accompanies him to most of his appointments now  Olegario Messier([name]).   It states that he is requesting to be referred to a neurologist within the [facility]. Attached to this note is documentation from prior neurology appt [facility] where he was diagnosed with peripheral neuropathy. Last evaluated Sept 2015. Prescribed duloxetine 30 mg daily.   Please refer him to Neurology within the [facility] if possible. Does anyone take [facility] Assistance? Thanks!  Nira RetortAnna W. Sams, ANP-BC [facility]

## 2016-04-02 ENCOUNTER — Other Ambulatory Visit: Payer: Self-pay

## 2016-04-02 DIAGNOSIS — G609 Hereditary and idiopathic neuropathy, unspecified: Secondary | ICD-10-CM

## 2016-04-02 NOTE — Telephone Encounter (Signed)
Referral has been made.

## 2016-04-21 ENCOUNTER — Ambulatory Visit: Payer: Self-pay | Admitting: Gastroenterology

## 2016-05-01 ENCOUNTER — Ambulatory Visit: Payer: Self-pay | Admitting: Gastroenterology

## 2016-05-20 ENCOUNTER — Encounter: Payer: Self-pay | Admitting: Gastroenterology

## 2016-05-20 ENCOUNTER — Ambulatory Visit (INDEPENDENT_AMBULATORY_CARE_PROVIDER_SITE_OTHER): Payer: Self-pay | Admitting: Gastroenterology

## 2016-05-20 VITALS — BP 104/65 | HR 84 | Temp 97.9°F | Ht 67.0 in | Wt 243.8 lb

## 2016-05-20 DIAGNOSIS — K7030 Alcoholic cirrhosis of liver without ascites: Secondary | ICD-10-CM

## 2016-05-20 MED ORDER — AZITHROMYCIN 250 MG PO TABS: ORAL_TABLET | ORAL | 0 refills | Status: DC

## 2016-05-20 NOTE — Progress Notes (Signed)
Referring Provider: Kirstie PeriShah, Ashish, MD Primary Care Physician:  Kirstie PeriSHAH,ASHISH, MD  Primary GI: Dr. Jena Gaussourk   Chief Complaint  Patient presents with  . Follow-up    HPI:   Joseph Cortez is a 50 y.o. male presenting today with a history of  ETOH cirhosis, GI bleed secondary to varices in April 2017 at [facility] s/p banding, with recent surveillance noting Grade 2 varices s/p banding with complete deflation of varices. Portal gastropathy.  Joseph Cortez, his sister, accompanies him to his appointments routinely these days. Encephalopathic at times. Had resumed drinking when seen in June 2017. Unable to tolerate BID dosing of propanolol, so taking once daily. EGD for surveillance in 1 year. Difficult in obtaining rehab facilities without paying out of pocket.   Have attempted Xifaxan paperwork: still needs to complete filling out.   Referral to psychiatry: they wanted him to complete ETOH rehab first. [place]. Started drinking when he came back out. [employer] has a Veterinary surgeoncounselor that is free.   Lasix 20 mg and aldactone 50 mg daily.   [facility]: no HCC.   Lot of congestion for almost 2 weeks. Runny nose. No sinus pressure. No fever. Was coughing up white phlegm but now more green.   Protonix BID. Continues to drink alcohol. Joseph Cortez present with patient. She feels frustrated and not sure how to help him. Joseph Cortez, the office manager, has found [facility] [place], the only rehab facility that could possibly take patient for free for ETOH detox. # [phone].    Past Medical History:  Diagnosis Date  . Anxiety   . Cirrhosis (HCC)   . Depression   . Neuromuscular disorder (HCC)    Neuropathy  . Neuropathy [facility](HCC)     Past Surgical History:  Procedure Laterality Date  . BIOPSY  06/27/2015   Procedure: BIOPSY (Gastric);  Surgeon: Corbin Adeobert M Rourk, MD;  Location: [facility] ORS;  Service: Endoscopy;;  . CHOLECYSTECTOMY    . ESOPHAGEAL BANDING N/A 01/30/2016   Procedure: ESOPHAGEAL BANDING;  Surgeon: Corbin Adeobert M Rourk, MD;  Location: [facility] ENDO SUITE;  Service: Endoscopy;  Laterality: N/A;  . ESOPHAGOGASTRODUODENOSCOPY  January 06, 2016   [facility]och [facility]: duodenitis in bullb, portal gastropathy, 2 columns of large distal esophageal varices, one with flat red spot, s/p band ligation. Needs 4 week surveillance EGD   . ESOPHAGOGASTRODUODENOSCOPY (EGD) WITH PROPOFOL N/A 06/27/2015   UJW:JXBJRMR:mild erosive reflux esophagitis, portal gastropathy, duodenal bulbar ulcer, negative H.pylori. Surveillance due yearly according to ASGE current guidelines regarding ETOH cirrhosis and ongoing ETOH abuse  . ESOPHAGOGASTRODUODENOSCOPY (EGD) WITH PROPOFOL N/A 01/30/2016   Dr. Jena Gaussourk: Grade 2 varices s/p banding with complete deflation of varices, portal gastropathy, surveillance in May 2018    Current Outpatient Prescriptions  Medication Sig Dispense Refill  . clonazePAM (KLONOPIN) 0.5 MG tablet Take 0.5 mg by mouth daily.    . folic acid (FOLVITE) 1 MG tablet Take 1 tablet (1 mg total) by mouth daily. Reported on 03/12/2016 30 tablet 5  . furosemide (LASIX) 20 MG tablet Take 1 tablet (20 mg total) by mouth daily. 90 tablet 3  . lactulose (CHRONULAC) 10 GM/15ML solution Take 30 mLs (20 g total) by mouth 2 (two) times daily. 1892 mL 5  . pantoprazole (PROTONIX) 40 MG tablet Take 1 tablet (40 mg total) by mouth 2 (two) times daily before a meal. 60 tablet 3  . propranolol (INDERAL) 20 MG tablet Take 1 tablet (20 mg  total) by mouth 2 (two) times daily. 60 tablet 3  . spironolactone (ALDACTONE) 50 MG tablet Take 1 tablet (50 mg total) by mouth daily. 90 tablet 3  . thiamine 100 MG tablet Take 100 mg by mouth.     No current facility-administered medications for this visit.     Allergies as of 05/20/2016  . (No Known Allergies)    Family History  Problem Relation Age of Onset  . Colon cancer Maternal Uncle     Social History   Social History  . Marital status: Divorced     Spouse name: N/A  . Number of children: N/A  . Years of education: N/A   Social History Main Topics  . Smoking status: Current Every Day Smoker    Packs/day: 0.50    Types: Cigarettes  . Smokeless tobacco: None  . Alcohol use 0.0 oz/week     Comment: Continued ETOH use  . Drug use: No     Comment: cocaine historically, several years ago  . Sexual activity: Not Asked   Other Topics Concern  . None   Social History Narrative  . None    Review of Systems: As mentioned in HPI   Physical Exam: BP 104/65   Pulse 84   Temp 97.9 F (36.6 C) (Oral)   Ht 5\' 7"  (1.702 m)   Wt 243 lb 12.8 oz (110.6 kg)   BMI 38.18 kg/m  General:   Alert and oriented. Some slurred speech, consistent with recent ETOH intake. No evidence of encephalopathy. No asterixis.  Head:  Normocephalic and atraumatic. Abdomen:  +BS, soft, round and obese. Non-tense mild ascites.  Msk:  Symmetrical without gross deformities. Normal posture. Extremities:  Mild trace edema bilateral lower extremities  Neurologic:  Alert and  oriented x4;  grossly normal neurologically. Negative asterixis  Psych:  Alert and cooperative. Normal mood and affect.  Lab Results  Component Value Date   ALT 21 03/12/2016   AST 46 (H) 03/12/2016   ALKPHOS 88 03/12/2016   BILITOT 1.1 03/12/2016   Lab Results  Component Value Date   CREATININE 0.78 03/20/2016   BUN 8 03/20/2016   NA 134 (L) 03/20/2016   K 4.4 03/20/2016   CL 102 03/20/2016   CO2 26 03/20/2016   Lab Results  Component Value Date   INR 1.33 03/12/2016   INR 1.36 01/29/2016   INR 1.50 (H) 12/26/2015

## 2016-05-20 NOTE — Patient Instructions (Signed)
We will try to get you in to the rehab facility.   We will see you in 2 months.  I have sent in azithromycin to take. Take 2 tablets today then 1 tablet daily for an additional 5 days. Please see your primary care doctor if you have any other issues with your upper respiratory problems.   Continue lasix 20 mg each morning and aldactone 50 mg each morning.

## 2016-05-29 NOTE — Assessment & Plan Note (Signed)
50 year old male with ETOH cirrhosis who continues to drink despite recent recommendations. Joseph Cortez, his sister, is present today. Despite our exhaustive efforts, patient continues to drink. I have personally tried to refer him for counseling, but he is unable to see psychiatry until he has gone through detox, and he is unable to enter a detox facility without insurance. [place] [facility], Joseph Cortez, [phone]) that is free, that our office manager has found. The patient and Joseph Cortez have been given this number. A bed was not available at time of the appointment today. IN ORDER TO BE ACCEPTED, HIS ALCOHOL LEVEL WILL NEED TO BE UNDER 200. When a bed is available, I have asked them to call Joseph Cortez, and we can order an ethanol level and then have this faxed to the facility.   In the interim:  1. Continue lasix 20 mg and aldactone 50 mg daily.  2. Continue lactulose. Complete Xifaxan paperwork as already directed 3. 2gram sodium diet 4. Counselor through [employer] (no cost), in an attempt to address psychosocial issues. Options are limited for emotional support due to lack of insurance and continued ETOH use. Discussed with patient how he is sabotaging his own care. Essentially our hands are tied, and I had a frank discussion about his continual decline and eventual death. He stated understanding, and Joseph Cortez was present during entire discussion. 5. HCC screening Oct 2017 6. 2 month follow-up 7. Continue propanolol  8. Zpack for upper respiratory infection: if any worsening, see PCP.

## 2016-06-02 NOTE — Progress Notes (Signed)
cc'ed to pcp °

## 2016-06-05 ENCOUNTER — Telehealth: Payer: Self-pay | Admitting: Gastroenterology

## 2016-06-05 DIAGNOSIS — K7030 Alcoholic cirrhosis of liver without ascites: Secondary | ICD-10-CM

## 2016-06-05 NOTE — Telephone Encounter (Addendum)
  Late entry: received a call from Dr. Thurston PoundsLaura Denunzio, psychiatrist working with Cristopher EstimableMichael Tokarski (personal contact #  of Dr. Steffanie Dunnenunzio [phone]) regarding trial of Naltrexone with close monitoring. Discussed at length with her, as he has continued to drink despite recommendations and caution is advised with the use of this drug with hepatic impairment. At this point, without alcohol cessation, he will eventually die. We are at a crossroads with his plan of care, as discussed with the psychiatrist. She will be rechecking LFTs in 2 weeks and following him closely. I will be seeing him Oct 24th.

## 2016-07-13 ENCOUNTER — Telehealth: Payer: Self-pay | Admitting: Gastroenterology

## 2016-07-13 NOTE — Telephone Encounter (Signed)
Spoke with Olegario MessierKathy, patient's sister. This medication was started one week ago, approximately week of Oct 9th. See phone note dated 10/16.

## 2016-07-13 NOTE — Telephone Encounter (Signed)
Received call from [place], patient's sister. Patient appears jaundiced, urine is dark. PATIENT IS REFUSING TO GO TO THE ED. Just left psychiatrist office. Psychiatrist office ALSO recommended ED evaluation.  I spoke with sister and patient. I ALSO advised ED evaluation. I discussed at length liver diseased and multi-organ involvement. Also discussed decompensation. Patient is not willing to go to the ED; he is only willing to come to an appointment in the office. He understands the risks and benefits of NOT seeking urgent evaluation. As of note, he has not had a drink in 5 days.   I have an opening tomorrow at 1130. I have ordered a stat CBC, CMP, and INR to be done first thing in the morning to be reviewed at time of office visit.

## 2016-07-14 ENCOUNTER — Inpatient Hospital Stay (HOSPITAL_COMMUNITY)
Admission: AD | Admit: 2016-07-14 | Discharge: 2016-07-16 | DRG: 433 | Disposition: A | Discharge status: Home / Self Care | Payer: Self-pay | Source: Ambulatory Visit | Attending: Internal Medicine | Admitting: Internal Medicine

## 2016-07-14 ENCOUNTER — Telehealth: Payer: Self-pay

## 2016-07-14 ENCOUNTER — Inpatient Hospital Stay (HOSPITAL_COMMUNITY): Payer: Self-pay

## 2016-07-14 ENCOUNTER — Other Ambulatory Visit (HOSPITAL_COMMUNITY)
Admission: RE | Admit: 2016-07-14 | Discharge: 2016-07-14 | Disposition: A | Discharge status: Home / Self Care | Payer: Medicaid - Out of State | Source: Ambulatory Visit | Attending: Gastroenterology | Admitting: Gastroenterology

## 2016-07-14 ENCOUNTER — Ambulatory Visit (INDEPENDENT_AMBULATORY_CARE_PROVIDER_SITE_OTHER): Payer: Self-pay | Admitting: Gastroenterology

## 2016-07-14 ENCOUNTER — Encounter (HOSPITAL_COMMUNITY): Payer: Self-pay | Admitting: Family Medicine

## 2016-07-14 VITALS — BP 98/65 | HR 66 | Temp 97.8°F | Ht 68.0 in | Wt 232.6 lb

## 2016-07-14 DIAGNOSIS — R945 Abnormal results of liver function studies: Secondary | ICD-10-CM

## 2016-07-14 DIAGNOSIS — K7460 Unspecified cirrhosis of liver: Secondary | ICD-10-CM | POA: Diagnosis present

## 2016-07-14 DIAGNOSIS — G629 Polyneuropathy, unspecified: Secondary | ICD-10-CM | POA: Diagnosis present

## 2016-07-14 DIAGNOSIS — E876 Hypokalemia: Secondary | ICD-10-CM

## 2016-07-14 DIAGNOSIS — N179 Acute kidney failure, unspecified: Secondary | ICD-10-CM | POA: Diagnosis present

## 2016-07-14 DIAGNOSIS — K7010 Alcoholic hepatitis without ascites: Secondary | ICD-10-CM

## 2016-07-14 DIAGNOSIS — D684 Acquired coagulation factor deficiency: Secondary | ICD-10-CM | POA: Diagnosis present

## 2016-07-14 DIAGNOSIS — D696 Thrombocytopenia, unspecified: Secondary | ICD-10-CM | POA: Diagnosis present

## 2016-07-14 DIAGNOSIS — E669 Obesity, unspecified: Secondary | ICD-10-CM | POA: Diagnosis present

## 2016-07-14 DIAGNOSIS — K7030 Alcoholic cirrhosis of liver without ascites: Secondary | ICD-10-CM | POA: Insufficient documentation

## 2016-07-14 DIAGNOSIS — K7031 Alcoholic cirrhosis of liver with ascites: Secondary | ICD-10-CM | POA: Diagnosis present

## 2016-07-14 DIAGNOSIS — I8510 Secondary esophageal varices without bleeding: Secondary | ICD-10-CM

## 2016-07-14 DIAGNOSIS — K7011 Alcoholic hepatitis with ascites: Principal | ICD-10-CM | POA: Diagnosis present

## 2016-07-14 DIAGNOSIS — Z23 Encounter for immunization: Secondary | ICD-10-CM

## 2016-07-14 DIAGNOSIS — D6959 Other secondary thrombocytopenia: Secondary | ICD-10-CM | POA: Diagnosis present

## 2016-07-14 DIAGNOSIS — E860 Dehydration: Secondary | ICD-10-CM | POA: Diagnosis present

## 2016-07-14 DIAGNOSIS — F1010 Alcohol abuse, uncomplicated: Secondary | ICD-10-CM | POA: Diagnosis present

## 2016-07-14 DIAGNOSIS — F17210 Nicotine dependence, cigarettes, uncomplicated: Secondary | ICD-10-CM | POA: Diagnosis present

## 2016-07-14 DIAGNOSIS — F419 Anxiety disorder, unspecified: Secondary | ICD-10-CM | POA: Diagnosis present

## 2016-07-14 DIAGNOSIS — D72829 Elevated white blood cell count, unspecified: Secondary | ICD-10-CM | POA: Diagnosis present

## 2016-07-14 DIAGNOSIS — I8500 Esophageal varices without bleeding: Secondary | ICD-10-CM | POA: Diagnosis present

## 2016-07-14 DIAGNOSIS — E871 Hypo-osmolality and hyponatremia: Secondary | ICD-10-CM

## 2016-07-14 DIAGNOSIS — D689 Coagulation defect, unspecified: Secondary | ICD-10-CM | POA: Diagnosis present

## 2016-07-14 DIAGNOSIS — K3189 Other diseases of stomach and duodenum: Secondary | ICD-10-CM | POA: Diagnosis present

## 2016-07-14 DIAGNOSIS — F1020 Alcohol dependence, uncomplicated: Secondary | ICD-10-CM | POA: Diagnosis present

## 2016-07-14 DIAGNOSIS — K766 Portal hypertension: Secondary | ICD-10-CM | POA: Diagnosis present

## 2016-07-14 DIAGNOSIS — Z6835 Body mass index (BMI) 35.0-35.9, adult: Secondary | ICD-10-CM

## 2016-07-14 DIAGNOSIS — R7989 Other specified abnormal findings of blood chemistry: Secondary | ICD-10-CM

## 2016-07-14 HISTORY — DX: Hypokalemia: E87.6

## 2016-07-14 HISTORY — DX: Hypo-osmolality and hyponatremia: E87.1

## 2016-07-14 HISTORY — DX: Acute kidney failure, unspecified: N17.9

## 2016-07-14 LAB — CBC
HCT: 39.2 % (ref 39.0–52.0)
HEMOGLOBIN: 13.8 g/dL (ref 13.0–17.0)
MCH: 33.7 pg (ref 26.0–34.0)
MCHC: 35.2 g/dL (ref 30.0–36.0)
MCV: 95.6 fL (ref 78.0–100.0)
PLATELETS: 144 10*3/uL — AB (ref 150–400)
RBC: 4.1 MIL/uL — ABNORMAL LOW (ref 4.22–5.81)
RDW: 23.3 % — AB (ref 11.5–15.5)
WBC: 10.1 10*3/uL (ref 4.0–10.5)

## 2016-07-14 LAB — COMPREHENSIVE METABOLIC PANEL
ALT: 84 U/L — AB (ref 17–63)
AST: 181 U/L — ABNORMAL HIGH (ref 15–41)
Albumin: 2.7 g/dL — ABNORMAL LOW (ref 3.5–5.0)
Alkaline Phosphatase: 164 U/L — ABNORMAL HIGH (ref 38–126)
Anion gap: 10 (ref 5–15)
BUN: 18 mg/dL (ref 6–20)
CALCIUM: 8.8 mg/dL — AB (ref 8.9–10.3)
CHLORIDE: 91 mmol/L — AB (ref 101–111)
CO2: 27 mmol/L (ref 22–32)
CREATININE: 1.27 mg/dL — AB (ref 0.61–1.24)
Glucose, Bld: 138 mg/dL — ABNORMAL HIGH (ref 65–99)
Potassium: 3.3 mmol/L — ABNORMAL LOW (ref 3.5–5.1)
Sodium: 128 mmol/L — ABNORMAL LOW (ref 135–145)
Total Bilirubin: 38.9 mg/dL (ref 0.3–1.2)
Total Protein: 7.7 g/dL (ref 6.5–8.1)

## 2016-07-14 LAB — PROTIME-INR
INR: 1.66
PROTHROMBIN TIME: 19.8 s — AB (ref 11.4–15.2)

## 2016-07-14 MED ORDER — POTASSIUM CHLORIDE 10 MEQ/100ML IV SOLN
10.0000 meq | Freq: Once | INTRAVENOUS | Status: AC
  Administered 2016-07-14: 10 meq via INTRAVENOUS
  Filled 2016-07-14: qty 100

## 2016-07-14 MED ORDER — ADULT MULTIVITAMIN W/MINERALS CH
1.0000 | ORAL_TABLET | Freq: Every day | ORAL | Status: DC
  Administered 2016-07-15 – 2016-07-16 (×2): 1 via ORAL
  Filled 2016-07-14 (×2): qty 1

## 2016-07-14 MED ORDER — MAGNESIUM SULFATE IN D5W 1-5 GM/100ML-% IV SOLN
1.0000 g | Freq: Once | INTRAVENOUS | Status: AC
  Administered 2016-07-14: 1 g via INTRAVENOUS
  Filled 2016-07-14: qty 100

## 2016-07-14 MED ORDER — HEPARIN SODIUM (PORCINE) 5000 UNIT/ML IJ SOLN
5000.0000 [IU] | Freq: Three times a day (TID) | INTRAMUSCULAR | Status: DC
  Filled 2016-07-14 (×2): qty 1

## 2016-07-14 MED ORDER — ACETAMINOPHEN 325 MG PO TABS: 650.0000 mg | ORAL_TABLET | Freq: Four times a day (QID) | ORAL | Status: DC | PRN

## 2016-07-14 MED ORDER — FOLIC ACID 1 MG PO TABS
1.0000 mg | ORAL_TABLET | Freq: Every day | ORAL | Status: DC
  Administered 2016-07-15 – 2016-07-16 (×2): 1 mg via ORAL
  Filled 2016-07-14 (×2): qty 1

## 2016-07-14 MED ORDER — ONDANSETRON HCL 4 MG/2ML IJ SOLN: 4.0000 mg | Freq: Four times a day (QID) | INTRAMUSCULAR | Status: DC | PRN

## 2016-07-14 MED ORDER — HYDROCODONE-ACETAMINOPHEN 5-325 MG PO TABS
1.0000 | ORAL_TABLET | ORAL | Status: DC | PRN
  Administered 2016-07-14 – 2016-07-16 (×3): 2 via ORAL
  Filled 2016-07-14 (×3): qty 2

## 2016-07-14 MED ORDER — LACTULOSE 10 GM/15ML PO SOLN
20.0000 g | Freq: Two times a day (BID) | ORAL | Status: DC
  Administered 2016-07-14 – 2016-07-16 (×4): 20 g via ORAL
  Filled 2016-07-14 (×4): qty 30

## 2016-07-14 MED ORDER — VITAMIN B-1 100 MG PO TABS
100.0000 mg | ORAL_TABLET | Freq: Every day | ORAL | Status: DC
  Administered 2016-07-15 – 2016-07-16 (×2): 100 mg via ORAL
  Filled 2016-07-14 (×2): qty 1

## 2016-07-14 MED ORDER — LORAZEPAM 2 MG/ML IJ SOLN
0.0000 mg | Freq: Four times a day (QID) | INTRAMUSCULAR | Status: DC
Start: 2016-07-14 — End: 2016-07-16
  Administered 2016-07-15: 1 mg via INTRAVENOUS
  Filled 2016-07-14: qty 1

## 2016-07-14 MED ORDER — PREDNISOLONE SODIUM PHOSPHATE 15 MG/5ML PO SOLN
40.0000 mg | Freq: Every day | ORAL | Status: DC
  Administered 2016-07-14 – 2016-07-16 (×3): 40 mg via ORAL
  Filled 2016-07-14 (×5): qty 15

## 2016-07-14 MED ORDER — ONDANSETRON HCL 4 MG PO TABS: 4.0000 mg | ORAL_TABLET | Freq: Four times a day (QID) | ORAL | Status: DC | PRN

## 2016-07-14 MED ORDER — GABAPENTIN 300 MG PO CAPS
300.0000 mg | ORAL_CAPSULE | Freq: Three times a day (TID) | ORAL | Status: DC
  Administered 2016-07-14 – 2016-07-16 (×5): 300 mg via ORAL
  Filled 2016-07-14 (×5): qty 1

## 2016-07-14 MED ORDER — LORAZEPAM 1 MG PO TABS
1.0000 mg | ORAL_TABLET | Freq: Four times a day (QID) | ORAL | Status: DC | PRN
  Administered 2016-07-15 – 2016-07-16 (×2): 1 mg via ORAL
  Filled 2016-07-14 (×2): qty 1

## 2016-07-14 MED ORDER — PROPRANOLOL HCL 20 MG PO TABS
20.0000 mg | ORAL_TABLET | Freq: Every day | ORAL | Status: DC
  Administered 2016-07-16: 20 mg via ORAL
  Filled 2016-07-14 (×2): qty 1

## 2016-07-14 MED ORDER — NALTREXONE HCL 50 MG PO TABS
50.0000 mg | ORAL_TABLET | Freq: Every day | ORAL | Status: DC
  Filled 2016-07-14: qty 1

## 2016-07-14 MED ORDER — CLONAZEPAM 0.5 MG PO TABS
0.5000 mg | ORAL_TABLET | Freq: Every day | ORAL | Status: DC
  Administered 2016-07-15 – 2016-07-16 (×2): 0.5 mg via ORAL
  Filled 2016-07-14 (×2): qty 1

## 2016-07-14 MED ORDER — LORAZEPAM 2 MG/ML IJ SOLN: 1.0000 mg | Freq: Four times a day (QID) | INTRAMUSCULAR | Status: DC | PRN

## 2016-07-14 MED ORDER — POTASSIUM CHLORIDE CRYS ER 20 MEQ PO TBCR
20.0000 meq | EXTENDED_RELEASE_TABLET | Freq: Once | ORAL | Status: AC
  Administered 2016-07-14: 20 meq via ORAL
  Filled 2016-07-14: qty 1

## 2016-07-14 MED ORDER — LORAZEPAM 2 MG/ML IJ SOLN: 0.0000 mg | Freq: Two times a day (BID) | INTRAMUSCULAR | Status: DC

## 2016-07-14 MED ORDER — INFLUENZA VAC SPLIT QUAD 0.5 ML IM SUSY
0.5000 mL | PREFILLED_SYRINGE | INTRAMUSCULAR | Status: AC
  Administered 2016-07-15: 0.5 mL via INTRAMUSCULAR
  Filled 2016-07-14: qty 0.5

## 2016-07-14 MED ORDER — PANTOPRAZOLE SODIUM 40 MG PO TBEC
40.0000 mg | DELAYED_RELEASE_TABLET | Freq: Two times a day (BID) | ORAL | Status: DC
Start: 2016-07-14 — End: 2016-07-16
  Administered 2016-07-14 – 2016-07-16 (×4): 40 mg via ORAL
  Filled 2016-07-14 (×4): qty 1

## 2016-07-14 MED ORDER — SODIUM CHLORIDE 0.9 % IV SOLN
INTRAVENOUS | Status: AC
  Administered 2016-07-14: 19:00:00 via INTRAVENOUS

## 2016-07-14 MED ORDER — ACETAMINOPHEN 650 MG RE SUPP: 650.0000 mg | Freq: Four times a day (QID) | RECTAL | Status: DC | PRN

## 2016-07-14 NOTE — Treatment Plan (Addendum)
Date of Admit Request: 07/14/16 at 1:10pm  Called by GI. Request for direct admission.  50yo alcoholic with cirrhosis presented to GI clinic with elevated Cr, decreased Na. Previously drank 4 bottles of wine daily. Now drinking one bottle daily. Last intake of ETOH reportedly 6 days prior. GI to continue to follow.  Accepted to Med-surg.

## 2016-07-14 NOTE — H&P (Signed)
History and Physical    Joseph Cortez:[id number] DOB: 01-Oct-1965 DOA: 07/14/2016  PCP: Joseph Peri, MD   Patient coming from: Home, by way of GI clinic   Chief Complaint: Jaundice, abnormal labs   HPI: Joseph Cortez is a 50 y.o. male with medical history significant for anxiety and alcohol abuse with cirrhosis who presents as a direct admission from his GI clinic for evaluation and management of acute alcoholic hepatitis with acute kidney injury. Patient has a long history of alcohol abuse and has been under the care of gastroenterology for management of cirrhosis. He has varices that bled in April of this year and were banded at that time at [facility]. He underwent surveillance EGD in May 2017 with grade II varices that were banded and completely eradicated. There is no apparent history of SBP, and he is said to have tested negative for viral hepatitis. Patient has continued to drink until 07/08/2016. He reports that he has now quit drinking. He endorses history of mild withdrawal symptoms which have included tremors, itching, and mild anxiety. He describes experiencing these symptoms the first 2 days that he abstain from drinking, but has not experienced that in the last couple days. He denies any history of seizure or DTs. He denies any recent fevers or chills. He endorses mild lower abdominal pain and mild occasional nausea, but denies vomiting. Patient was evaluated with basic blood work earlier today and noted to have a bilirubin of 38.9, up from 1.1 in June of this year. He was also noted to have acute kidney injury with serum creatinine 1.27, up from 0.78 in June. He was encouraged to seek evaluation in the emergency department, but refused. Gastroenterologist was able to fit him in for same-day appointment and has arranged a direct admission.  Patient is interviewed and examined on the medical-surgical unit at [facility] where he is afebrile, his vital signs are  stable, and he is in no apparent respiratory distress. He will be admitted for ongoing evaluation and management of acute alcoholic hepatitis with acute kidney injury.  Review of Systems:  All other systems reviewed and apart from HPI, are negative.  Past Medical History:  Diagnosis Date  . Anxiety   . Cirrhosis (HCC)   . Depression   . Neuromuscular disorder (HCC)    Neuropathy  . Neuropathy [facility])     Past Surgical History:  Procedure Laterality Date  . BIOPSY  06/27/2015   Procedure: BIOPSY (Gastric);  Surgeon: Corbin Ade, MD;  Location: [facility] ORS;  Service: Endoscopy;;  . CHOLECYSTECTOMY    . ESOPHAGEAL BANDING N/A 01/30/2016   Procedure: ESOPHAGEAL BANDING;  Surgeon: Corbin Ade, MD;  Location: [facility] ENDO SUITE;  Service: Endoscopy;  Laterality: N/A;  . ESOPHAGOGASTRODUODENOSCOPY  January 06, 2016   [facility]. Alycia [facility]setti [facility]: duodenitis in bullb, portal gastropathy, 2 columns of large distal esophageal varices, one with flat red spot, s/p band ligation. Needs 4 week surveillance EGD   . ESOPHAGOGASTRODUODENOSCOPY (EGD) WITH PROPOFOL N/A 06/27/2015   GMW:NUUV erosive reflux esophagitis, portal gastropathy, duodenal bulbar ulcer, negative H.pylori. Surveillance due yearly according to ASGE current guidelines regarding ETOH cirrhosis and ongoing ETOH abuse  . ESOPHAGOGASTRODUODENOSCOPY (EGD) WITH PROPOFOL N/A 01/30/2016   Dr. Jena Gauss: Grade 2 varices s/p banding with complete deflation of varices, portal gastropathy, surveillance in May 2018     reports that he has been smoking Cigarettes.  He has been smoking about 0.50 packs per day. He has never used smokeless tobacco.  He reports that he drinks alcohol. He reports that he does not use drugs.  No Known Allergies  Family History  Problem Relation Age of Onset  . Colon cancer Maternal Uncle      Prior to Admission medications   Medication Sig Start Date End Date Taking? Authorizing Provider  clonazePAM (KLONOPIN) 0.5 MG tablet Take 0.5 mg  by mouth daily.    Historical Provider, MD  folic acid (FOLVITE) 1 MG tablet Take 1 tablet (1 mg total) by mouth daily. Reported on 03/12/2016 03/13/16   Gelene Mink, NP  furosemide (LASIX) 20 MG tablet Take 1 tablet (20 mg total) by mouth daily. 03/12/16   Gelene Mink, NP  gabapentin (NEURONTIN) 300 MG capsule Take 300 mg by mouth 3 (three) times daily.    Historical Provider, MD  lactulose (CHRONULAC) 10 GM/15ML solution Take 30 mLs (20 g total) by mouth 2 (two) times daily. 01/29/16   Gelene Mink, NP  naltrexone (DEPADE) 50 MG tablet Take by mouth daily.    Historical Provider, MD  pantoprazole (PROTONIX) 40 MG tablet Take 1 tablet (40 mg total) by mouth 2 (two) times daily before a meal. 01/29/16   Gelene Mink, NP  propranolol (INDERAL) 20 MG tablet Take 20 mg by mouth daily.    Historical Provider, MD  spironolactone (ALDACTONE) 50 MG tablet Take 1 tablet (50 mg total) by mouth daily. 03/12/16   Gelene Mink, NP  thiamine 100 MG tablet Take 100 mg by mouth. 01/08/16   Historical Provider, MD    Physical Exam: There were no vitals filed for this visit.    Constitutional: NAD, calm, comfortable, jaundiced Eyes: PERTLA, lids normal, scleral icterus ENMT: Mucous membranes are moist. Posterior pharynx clear of any exudate or lesions.   Neck: normal, supple, no masses, no thyromegaly Respiratory: clear to auscultation bilaterally, no wheezing, no crackles. Normal respiratory effort.  Cardiovascular: S1 & S2 heard, regular rate and rhythm, soft systolic murmur at RSB. No carotid bruits. No significant JVD. Abdomen: Mild distension, no tenderness, no masses palpated. Bowel sounds normal.  Musculoskeletal: no clubbing / cyanosis. No joint deformity upper and lower extremities. Normal muscle tone.  Skin: no significant rashes, lesions, ulcers. Warm, dry, well-perfused. Jaundiced Neurologic: CN 2-12 grossly intact. Sensation intact, DTR normal. Strength 5/5 in all 4 limbs.  Psychiatric: Normal  judgment and insight. Alert and oriented x 3. Normal mood and affect.     Labs on Admission: I have personally reviewed following labs and imaging studies  CBC:  Recent Labs Lab 07/14/16 1110  WBC 10.1  HGB 13.8  HCT 39.2  MCV 95.6  PLT 144*   Basic Metabolic Panel:  Recent Labs Lab 07/14/16 1110  NA 128*  K 3.3*  CL 91*  CO2 27  GLUCOSE 138*  BUN 18  CREATININE 1.27*  CALCIUM 8.8*   GFR: Estimated Creatinine Clearance: 83.7 mL/min (by C-G formula based on SCr of 1.27 mg/dL (H)). Liver Function Tests:  Recent Labs Lab 07/14/16 1110  AST 181*  ALT 84*  ALKPHOS 164*  BILITOT 38.9*  PROT 7.7  ALBUMIN 2.7*   No results for input(s): LIPASE, AMYLASE in the last 168 hours. No results for input(s): AMMONIA in the last 168 hours. Coagulation Profile:  Recent Labs Lab 07/14/16 1110  INR 1.66   Cardiac Enzymes: No results for input(s): CKTOTAL, CKMB, CKMBINDEX, TROPONINI in the last 168 hours. BNP (last 3 results) No results for input(s): PROBNP in the last 8760  hours. HbA1C: No results for input(s): HGBA1C in the last 72 hours. CBG: No results for input(s): GLUCAP in the last 168 hours. Lipid Profile: No results for input(s): CHOL, HDL, LDLCALC, TRIG, CHOLHDL, LDLDIRECT in the last 72 hours. Thyroid Function Tests: No results for input(s): TSH, T4TOTAL, FREET4, T3FREE, THYROIDAB in the last 72 hours. Anemia Panel: No results for input(s): VITAMINB12, FOLATE, FERRITIN, TIBC, IRON, RETICCTPCT in the last 72 hours. Urine analysis: No results found for: COLORURINE, APPEARANCEUR, LABSPEC, PHURINE, GLUCOSEU, HGBUR, BILIRUBINUR, KETONESUR, PROTEINUR, UROBILINOGEN, NITRITE, LEUKOCYTESUR Sepsis Labs: @LABRCNTIP (procalcitonin:4,lacticidven:4) )No results found for this or any previous visit (from the past 240 hour(s)).   Radiological Exams on Admission: No results found.  EKG: Not performed, will obtain as appropriate.   Assessment/Plan  1. Alcoholic  cirrhosis with varices; acute alcoholic hepatitis with ascites  - Pt with known alcoholic cirrhosis followed by GI; grade II varices banded in May and completely deflated - Managed with propanalol, lactulose, Lasix, Aldactone, vitamins; Lasix and Aldactone held at admission while pt is hydrated  - Presents with t bili 39 (1.1 in June), ASA 181 (46 in June), ALT 84 (21 in June), SCr 1.27 (0.66 in June), INR 1.66 (1.33 in June)  - There is no fever or leukocytosis; no suggestion of acute infection  - Complete abd US performed, official read pending; per discussion with tech, there is mild ascites, no focal liver lesion, normal-appearing kidneys - Maddrey discriminant function score on admission is 65; hep C negative in Sept 2016, viral hep panel neg per GI - Plan for IVF hydration, correct electrolyte derangements, prednisolone per GI, infection surveillance, acid-suppression,  repeat chem panel in am   2. Acute kidney injury  - SCr 1.27 on admission, up from 0.78 in June - Likely a prerenal azotemia secondary to intravascular volume depletion  - Hydrating with NS, holding diuretics, abd US done with read pending; repeat chem panel in am   3. Hyponatremia; hypokalemia  - Serum sodium 128 on admission in setting of dehydration and cirrhosis with effective arterial volume depletion; hydrating with NS  - Serum potassium 3.3 on admission; replaced with 10 mEq IV and 20 mEq oral potassium; add mag level to am chem panel; 1g magnesium given IV empirically  - Repeat chem panel with mag level in am    4. Coagulopathy; thrombocytopenia  - INR 1.66 and platelets 144,000 on admission with no s/s of bleeding  - Secondary to cirrhosis and alcohol abuse; pt has stopped drinking alcohol, will monitor    5. 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 Anxiety - Stable, continue daily Klonopin  - Monitoring with CIWA and prn Ativan as above   DVT prophylaxis: sq heparin  Code Status: Full  Family Communication: Discussed with patient Disposition Plan: Admit to med-surg Consults called: Gastroenterology Admission status: Inpatient    Briscoe Deutscherimothy S Opyd, MD [facility] Pager [phone]  If 7PM-7AM, please contact night-coverage [email or web address] Password [email or web address]  07/14/2016, 4:11 PM

## 2016-07-14 NOTE — Progress Notes (Signed)
cc'ed to pcp °

## 2016-07-14 NOTE — Telephone Encounter (Signed)
[facility] [place] [facility], pt's total bilirubin is 38.9.  Lewie LoronAnna Boone, NP has been informed and pt is still here at the office visit.

## 2016-07-14 NOTE — Patient Instructions (Signed)
Please go to admissions. They will have a bed available for you on the third floor.  We will see you while you are in the hospital.  I will also see you when you get out of the hospital for a follow-up appointment, which we will arrange later.   I'm proud of you that you have stopped drinking several days ago!

## 2016-07-14 NOTE — Progress Notes (Addendum)
Referring Provider: Kirstie Cortez, Ashish, MD Primary Care Physician:  Joseph Cortez,ASHISH, MD  Primary GI: Dr. Jena Gaussourk   Chief Complaint  Patient presents with  . Follow-up    HPI:   Joseph Cortez is a 50 y.o. male presenting today with a history of ETOH cirhosis, GI bleed secondary to varices in April 2017 at [facility] s/p banding, with recent surveillance noting Grade 2 varices s/p banding with complete deflation of varices. Portal gastropathy. History of hepatic encephalopathy. Unable to tolerate BID dosing of propanolol. EGD due for surveillance in 1 year. Has continued to drink alcohol despite our long discussions. Our office has attempted placement at facilities including [facility] [place], the only rehab facility that would take patient for free. He is followed by Dr. Thurston PoundsLaura Cortez, psychiatrist in [place]. He was started on Naltrexone about 3 days ago in the hopes of helping with ETOH cessation. He was to see me Oct 24th for a visit, where LFTs would be checked again. I received a call yesterday from his sister, Joseph MessierKathy, that he was jaundiced. I recommended ED evaluation, but patient refused. He was also recommended ED evaluation by the psychiatrist office, where he had been yesterday as well. Patient has continued to drink, with last drink 6 days ago. Went from 3-4 bottles of wine to just one bottle. Last Wednesday went from wine to beer and had 4 bottles of beer.  He was agreeable to stat labs this morning and to be seen in the office, but he was not agreeable to ED evaluation.   States he noticed yellowing of his skin over a week ago. Urine is dark. Feels foggy-headed. No abdominal pain. No fever/chills. Had diarrhea last week and took Imodium. Held lactulose dosing during this time. Labs completed prior to be seeing today with bilirubin 38.9. Cr 1.27, up from baseline. AST 181, ALT 84, Na 128, potasium 3.3. INR 1.66. Platelets 144. No leukocytosis. Discriminant function 60. Last imaging studies in  April 2017 and [facility]. Has been on Lasix 20 mg and Aldactone 50 mg as outpatient and tolerated this well historically. He has only been able to take propanolol once daily due to issues with hypotension. Today, his BP is 98/68 and he feels "foggy". Complains of muscle cramping. States he took one tylenol pill several days ago but that was it. He rarely takes any tylenol because he is afraid to take this. Denies any other OTC agents other than what is listed in his medication list.    Past Medical History:  Diagnosis Date  . Anxiety   . Cirrhosis (HCC)   . Depression   . Neuromuscular disorder (HCC)    Neuropathy  . Neuropathy [facility](HCC)     Past Surgical History:  Procedure Laterality Date  . BIOPSY  06/27/2015   Procedure: BIOPSY (Gastric);  Surgeon: Corbin Adeobert M Rourk, MD;  Location: [facility] ORS;  Service: Endoscopy;;  . CHOLECYSTECTOMY    . ESOPHAGEAL BANDING N/A 01/30/2016   Procedure: ESOPHAGEAL BANDING;  Surgeon: Corbin Adeobert M Rourk, MD;  Location: [facility] ENDO SUITE;  Service: Endoscopy;  Laterality: N/A;  . ESOPHAGOGASTRODUODENOSCOPY  January 06, 2016   [facility]: duodenitis in bullb, portal gastropathy, 2 columns of large distal esophageal varices, one with flat red spot, s/p band ligation. Needs 4 week surveillance EGD   . ESOPHAGOGASTRODUODENOSCOPY (EGD) WITH PROPOFOL N/A 06/27/2015   XBJ:YNWGRMR:mild erosive reflux esophagitis, portal gastropathy, duodenal bulbar ulcer, negative H.pylori. Surveillance due yearly according to  ASGE current guidelines regarding ETOH cirrhosis and ongoing ETOH abuse  . ESOPHAGOGASTRODUODENOSCOPY (EGD) WITH PROPOFOL N/A 01/30/2016   Dr. Jena Cortez: Grade 2 varices s/p banding with complete deflation of varices, portal gastropathy, surveillance in May 2018    Current Outpatient Prescriptions  Medication Sig Dispense Refill  . clonazePAM (KLONOPIN) 0.5 MG tablet Take 0.5 mg by mouth daily.    . folic acid (FOLVITE) 1 MG tablet Take 1 tablet (1 mg  total) by mouth daily. Reported on 03/12/2016 30 tablet 5  . furosemide (LASIX) 20 MG tablet Take 1 tablet (20 mg total) by mouth daily. 90 tablet 3  . gabapentin (NEURONTIN) 300 MG capsule Take 300 mg by mouth 3 (three) times daily.    Marland Kitchen lactulose (CHRONULAC) 10 GM/15ML solution Take 30 mLs (20 g total) by mouth 2 (two) times daily. 1892 mL 5  . naltrexone (DEPADE) 50 MG tablet Take by mouth daily.    . pantoprazole (PROTONIX) 40 MG tablet Take 1 tablet (40 mg total) by mouth 2 (two) times daily before a meal. 60 tablet 3  . propranolol (INDERAL) 20 MG tablet Take 20 mg by mouth daily.    Marland Kitchen spironolactone (ALDACTONE) 50 MG tablet Take 1 tablet (50 mg total) by mouth daily. 90 tablet 3  . thiamine 100 MG tablet Take 100 mg by mouth.     No current facility-administered medications for this visit.     Allergies as of 07/14/2016  . (No Known Allergies)    Family History  Problem Relation Age of Onset  . Colon cancer Maternal Uncle     Social History   Social History  . Marital status: Divorced    Spouse name: N/A  . Number of children: N/A  . Years of education: N/A   Social History Main Topics  . Smoking status: Current Every Day Smoker    Packs/day: 0.50    Types: Cigarettes  . Smokeless tobacco: Not on file  . Alcohol use 0.0 oz/week     Comment: Continued ETOH use  . Drug use: No     Comment: cocaine historically, several years ago  . Sexual activity: Not on file   Other Topics Concern  . Not on file   Social History Narrative  . No narrative on file    Review of Systems: As mentioned in HPI   Physical Exam: BP 98/65   Pulse 66   Temp 97.8 F (36.6 C) (Oral)   Ht 5\' 8"  (1.727 m)   Wt 232 lb 9.6 oz (105.5 kg)   BMI 35.37 kg/m  General:   Alert and oriented. Significantly jaundiced.  Head:  Normocephalic and atraumatic. Eyes:  Notable scleral icterus  Heart:  S1, S2 present without murmurs Lungs: clear to auscultation bilaterally  Abdomen:  +BS,  distended but soft, no evidence of tense ascites, hepatomegaly noted, non-tender. Large AP diameter. No rebound or guarding Msk:  Symmetrical without gross deformities. Normal posture. Extremities:  Without edema. Neurologic:  Alert and  oriented x4 Psych:  Alert and cooperative. Normal mood and affect.   Lab Results  Component Value Date   WBC 10.1 07/14/2016   HGB 13.8 07/14/2016   HCT 39.2 07/14/2016   MCV 95.6 07/14/2016   PLT 144 (L) 07/14/2016   Lab Results  Component Value Date   INR 1.66 07/14/2016   INR 1.33 03/12/2016   INR 1.36 01/29/2016   Lab Results  Component Value Date   ALT 84 (H) 07/14/2016   AST 181 (  H) 07/14/2016   ALKPHOS 164 (H) 07/14/2016   BILITOT 38.9 (HH) 07/14/2016   Lab Results  Component Value Date   CREATININE 1.27 (H) 07/14/2016   BUN 18 07/14/2016   NA 128 (L) 07/14/2016   K 3.3 (L) 07/14/2016   CL 91 (L) 07/14/2016   CO2 27 07/14/2016   Assessment/Plan:  50 year old male presenting with jaundice, onset approximately a week ago, in the presence of notable significant ETOH intake; he states he has not had a drink in at least 6 days, however. He has been followed closely by psychiatry, Dr. Thurston Cortez, and advised to seek ED care yesterday evening due to jaundice after psychiatry appointment. I also advised ED evaluation when he contacted our office with reports of new-onset jaundice. He is well-known to me from  prior visits as an outpatient and persistent alcohol abuse in setting of known ETOH cirrhosis. He was not agreeable to ED evaluation but did agree to an urgent office visit.   Labs today revealed bilirubin 38, AST disproportionately elevated with ALT and increased from baseline, INR 1.66, no leukocytosis, bump in Cr to 1.27 from baseline, mild electrolyte abnormalities. He feels fatigued and "foggy-headed". BP is 98/65 in clinic but he is not tachycardic. This is likely multifactorial, and he has had difficulty tolerating non-selective  beta-blocker historically. He may need to come off this entirely. Clinically, he does not appear toxic or acutely ill, but I am concerned with deterioration of his status if he is managed as an outpatient. I feel he would do well with a brief observation status for fluid resuscitation, electrolyte correction, repeat labs in am and ensure his renal function is improving after hydration. His discriminant function is 60; no signs/symptoms of co-existing infection that would be a contraindication to prednisolone therapy. He has a negative Hep C antibody on file from Sept 2016. Will need prednisolone therapy and US abdomen to exclude any occult etiology. Unable to find Hep B serology in chart. Does not appear to be a viral hepatitis presentation. Will check Hep B surface antigen for our records while inpatient and start prednisolone therapy now. Hold Naltrexone. Discussed with Dr. Jena Cortez.   Contacted Dr. Rhona Leavens regarding hospital admission. Agreed to accept patient for hospital admission. We will follow  while inpatient. I will go ahead and order an US abdomen and prednisolone therapy once patient is officially admitted to unit 300.   Gelene Mink, ANP-BC [facility]

## 2016-07-14 NOTE — Assessment & Plan Note (Addendum)
50 year old male presenting with jaundice, onset approximately a week ago, in the presence of notable significant ETOH intake; he states he has not had a drink in at least 6 days, however. He has been followed  closely by psychiatry, Dr. Thurston PoundsLaura Denunzio, and advised to seek ED care yesterday evening due to jaundice. I also advised ED evaluation when he contacted our office with reports of new-onset jaundice. He is well-known to me from  prior visits as an outpatient and persistent alcohol abuse in setting of known ETOH cirrhosis. He was not agreeable to ED evaluation but did agree to an urgent office visit.   Labs today revealed bilirubin 38, AST disproportionately elevated with ALT and increased from baseline, INR 1.66, no leukocytosis, bump in Cr to 1.27 from baseline, mild electrolyte abnormalities. He feels fatigued and "foggy-headed". BP is 98/65 in clinic but he is not tachycardic. This is likely multifactorial, and he has had difficulty tolerating non-selective beta-blocker historically. He may need to come off this entirely. Clinically, he does not appear toxic or acutely ill, but I am concerned with deterioration of his status if he is managed as an outpatient. I feel he would do well with a brief observation status for fluid resuscitation, electrolyte correction, repeat labs in am and ensure his renal function is improving after hydration. His discriminant function is 60; no signs/symptoms of co-existing infection that would be a contraindication to prednisolone therapy. He has a negative Hep C antibody on file from Sept 2016. Will need prednisolone therapy and US abdomen to exclude any occult etiology. Unable to find Hep B serology in chart. Does not appear to be a viral hepatitis presentation. Will check Hep B surface antigen for our records while inpatient and start prednisolone therapy now. Hold Naltrexone. Discussed with Dr. Jena Gaussourk.   Contacted Dr. Rhona Leavenshiu regarding hospital admission. Agreed to accept  patient for hospital admission. We will follow patient while inpatient.

## 2016-07-15 DIAGNOSIS — F419 Anxiety disorder, unspecified: Secondary | ICD-10-CM

## 2016-07-15 DIAGNOSIS — K7011 Alcoholic hepatitis with ascites: Principal | ICD-10-CM

## 2016-07-15 DIAGNOSIS — F1010 Alcohol abuse, uncomplicated: Secondary | ICD-10-CM

## 2016-07-15 DIAGNOSIS — K7031 Alcoholic cirrhosis of liver with ascites: Secondary | ICD-10-CM

## 2016-07-15 DIAGNOSIS — N179 Acute kidney failure, unspecified: Secondary | ICD-10-CM

## 2016-07-15 LAB — BASIC METABOLIC PANEL
Anion gap: 12 (ref 5–15)
BUN: 19 mg/dL (ref 6–20)
CALCIUM: 8.3 mg/dL — AB (ref 8.9–10.3)
CHLORIDE: 95 mmol/L — AB (ref 101–111)
CO2: 21 mmol/L — AB (ref 22–32)
CREATININE: 1.16 mg/dL (ref 0.61–1.24)
GFR calc non Af Amer: >60 mL/min (ref >60)
Glucose, Bld: 201 mg/dL — ABNORMAL HIGH (ref 65–99)
Potassium: 3.3 mmol/L — ABNORMAL LOW (ref 3.5–5.1)
SODIUM: 128 mmol/L — AB (ref 135–145)

## 2016-07-15 LAB — HEPATIC FUNCTION PANEL
ALBUMIN: 2.5 g/dL — AB (ref 3.5–5.0)
ALK PHOS: 156 U/L — AB (ref 38–126)
ALT: 74 U/L — ABNORMAL HIGH (ref 17–63)
AST: 152 U/L — ABNORMAL HIGH (ref 15–41)
BILIRUBIN DIRECT: 19.4 mg/dL — AB (ref 0.1–0.5)
BILIRUBIN TOTAL: 33.1 mg/dL — AB (ref 0.3–1.2)
Indirect Bilirubin: 13.7 mg/dL — ABNORMAL HIGH (ref 0.3–0.9)
Total Protein: 7.2 g/dL (ref 6.5–8.1)

## 2016-07-15 LAB — CBC
HCT: 36.5 % — ABNORMAL LOW (ref 39.0–52.0)
Hemoglobin: 13.1 g/dL (ref 13.0–17.0)
MCH: 33.2 pg (ref 26.0–34.0)
MCHC: 35.9 g/dL (ref 30.0–36.0)
MCV: 92.6 fL (ref 78.0–100.0)
PLATELETS: 123 10*3/uL — AB (ref 150–400)
RBC: 3.94 MIL/uL — ABNORMAL LOW (ref 4.22–5.81)
RDW: 24.2 % — AB (ref 11.5–15.5)
WBC: 6.8 10*3/uL (ref 4.0–10.5)

## 2016-07-15 LAB — MAGNESIUM: MAGNESIUM: 2.4 mg/dL (ref 1.7–2.4)

## 2016-07-15 LAB — HEPATITIS B SURFACE ANTIGEN: HEP B S AG: NEGATIVE

## 2016-07-15 NOTE — Progress Notes (Signed)
CRITICAL VALUE ALERT  Critical value received:  Tot. Bilirubin 33.1  Date of notification:  10/18  Time of notification: 0640  Critical value read back:yes   Nurse who received alert:  K. Maisie Fushomas RN  MD notified (1st page):  Dr. Conley RollsLE  Time of first page: 0645  MD notified (2nd page):  Time of second page:  Responding MD: Dr. Conley RollsLe  Time MD responded:  [phone]

## 2016-07-15 NOTE — Clinical Social Work Note (Signed)
Clinical Social Work Assessment  Patient Details  Name: Joseph Cortez MRN: [id number] Date of Birth: December 06, 1965  Date of referral:  07/15/16               Reason for consult:  Other (Comment Required) (Alcohol abuse)                Permission sought to share information with:    Permission granted to share information::     Name::        Agency::     Relationship::     Contact Information:     Housing/Transportation Living arrangements for the past 2 months:  Single Family Home Source of Information:  Patient Patient Interpreter Needed:  None Criminal Activity/Legal Involvement Pertinent to Current Situation/Hospitalization:  No - Comment as needed Significant Relationships:  Adult Children, Dependent Children, Siblings, Parents Lives with:  Self Do you feel safe going back to the place where you live?  Yes Need for family participation in patient care:  Yes (Comment)  Care giving concerns: None identified  Office managerocial Worker assessment / plan:  Patient stated that he has been sober for 7 days now. He stated that he has been aware of his issues with alcohol for about 23 years now. Patinet completed the SBIRT with CSW. Patient has been to treatment in three locations this year: [place] in June for 4-5 days to get "dried out," [facility] (either for behavioral or alcohol abuse)  and the [facility] [place] on the sixth floor for alcohol abuse. Patent stated that he goes to [facility] for psychiatric treatment and that they have SA services that he can become involved in.  CSW stressed the importance of after treatment/[facility] and/or AA meetings to assist in maintaining sobriety. Patient stated that he was aware of two AA meeting locations and that he would go to one of them.  He also stated that he goes back to [facility] in two weeks and that he would then get more information about SA treatment. CSW stressed the importance of seeking  treatment immediately upon discharge to maintain sobriety.  CSW offered to assist with appointment scheduling but patient declined and said that he would take care of it.  CSW provided alcohol resources for inpatient/outpatient treatment and alcohol abuse fact sheet.  CSW signing off.    Employment status:  Unemployed Health and safety inspectornsurance information:  Self Pay (Medicaid Pending) PT Recommendations:  Not assessed at this time Information / Referral to community resources:  Support Groups, SBIRT, Residential Substance Abuse Treatment Options, Outpatient Substance Abuse Treatment Options  Patient/Family's Response to care:  Patient states that he will seek his own treatment.   Patient/Family's Understanding of and Emotional Response to Diagnosis, Current Treatment, and Prognosis:  Patient understands his diagnosis, treatment and prognosis.   Emotional Assessment Appearance:  Appears stated age Attitude/Demeanor/Rapport:   (Coooperative/pleasant) Affect (typically observed):  Accepting Orientation:  Oriented to Self, Oriented to Place, Oriented to  Time, Oriented to Situation Alcohol / Substance use:  Alcohol Use, Tobacco Use (Smokes .5 pack of cigarettes per day) Psych involvement (Current and /or in the community):  No (Comment)  Discharge Needs  Concerns to be addressed:  Substance Abuse Concerns Readmission within the last 30 days:  No Current discharge risk:  Substance Abuse Barriers to Discharge:  No Barriers Identified   Annice NeedySettle, Clemence Lengyel D, LCSW 07/15/2016, 9:21 AM

## 2016-07-15 NOTE — Progress Notes (Signed)
Inpatient Diabetes Program Recommendations  AACE/ADA: New Consensus Statement on Inpatient Glycemic Control (2015)  Target Ranges:  Prepandial:   less than 140 mg/dL      Peak postprandial:   less than 180 mg/dL (1-2 hours)      Critically ill patients:  140 - 180 mg/dL   No results found for: GLUCAP, HGBA1C  Review of Glycemic Control  Diabetes history: None Current orders for Inpatient glycemic control: None  Inpatient Diabetes Program Recommendations:   Elevated lab glucose 201 mg/dl this am at [id number] am. Consider A1c to assess glucose levels over the past 2-3 months. Also consider CBGs and Novolog Sensitive Correction TID.  Thanks,  Christena DeemShannon Kenyah Luba RN, MSN, [facility] Inpatient Diabetes Coordinator Team Pager [phone] (8a-5p)

## 2016-07-15 NOTE — Progress Notes (Signed)
PROGRESS NOTE    Joseph Cortez  ZOX:[id number] DOB: 14-Oct-1965 DOA: 07/14/2016 PCP: Kirstie Peri, MD    Brief Narrative:  52 yom with a PMHx of anxiety, alcohol abuse with cirrhosis, and neuropathy presents from his GI clinic with jaundice and acute alcoholic hepatitis. He has varices that bled in 4/17 which were banded at [facility].  He underwent an EGD 4/17 with grade II varices that were banded and eradicated. While in the ED, he was noted to be hypokalemic and hyponatremic. He was noted to have and  elevated T. bilirubin of 38.9, Cr 1.27, alkaline phospate 164, and elevated LFTs. Abdominal US shows a liver is prominent with a nodular contour in diffuse increased echogenicity consistent with known cirrhosis. GI was consulted and he has been admitted for further evaluation of alcoholic cirrhosis with varices.   Assessment & Plan:   Principal Problem:   Alcoholic hepatitis with ascites Active Problems:   Hepatic cirrhosis (HCC)   Portal hypertensive gastropathy   Esophageal varices without bleeding (HCC)   AKI (acute kidney injury) (HCC)   Hyponatremia   Hypokalemia   Alcohol abuse   Anxiety   Coagulopathy (HCC)   Thrombocytopenia (HCC)   Elevated LFTs  1. Alcoholic cirrhosis with varices, alcoholic hepatitis with ascites. He is followed by gastroenterology for known alcoholic cirrhosis. He has grade II varices in May in which he was completely deflated. Abdominal US shows a liver is prominent with a nodular contour in diffuse increased echogenicity. He has been started on prednisolone. Hepatitis viral abs negative. Bilirubin is mildly better today. Continue to follow. GI following.  2. AKI in the setting of volume depletion. His creatinine was 1.27 on admission. His baseline creatinine is 0.78. I suspect this is azotemia secondary to volume depletion. Will hold nephrotoxic drugs and continue IV fluids. Continue to hold diuretics. Renal function improving.  3. Hypovolemic  hyponatremia.  He is being hydrated with IV fluids.  4. Hypokalemia. Check mg. Replete.  5. Coagulopathy secondary to liver disease. He has an INR of 1.66. His platelets were 144,000 on admission with no signs of bleeding. This is secondary to cirrhosis with alcohol abuse. He states he has stopped drinking alcohol, will monitor.  6. Alcohol abuse.  He states his last drink was 6 days ago. He has been started on CIWA protocol with PRN ativan. No signs of withdrawal at present 7. Anxiety. Stable, will continue Klonopin.   DVT prophylaxis: Heparin  Code Status: Full  Family Communication: Discussed with patient Disposition Plan: Discharge home once improved.    Consultants:   Gastroenterology   Procedures:   None   Antimicrobials:   None    Subjective: Feeling better today. Occasional abdominal pain in suprapubic and RUQ area  Objective: Vitals:   07/14/16 1611 07/14/16 2211 07/14/16 2230 07/15/16 0411  BP: 97/64 107/60  100/67  Pulse: 68 60  61  Resp: 16 20  20   Temp: 98 F (36.7 C) 98.3 F (36.8 C)  97.5 F (36.4 C)  TempSrc: Oral Oral  Oral  SpO2: 97% 96% 97% 98%  Weight: 104.2 kg (229 lb 11.2 oz)   104.6 kg (230 lb 9.6 oz)  Height: 5\' 8"  (1.727 m)       Intake/Output Summary (Last 24 hours) at 07/15/16 0734 Last data filed at 07/14/16 2100  Gross per 24 hour  Intake              240 ml  Output  400 ml  Net             -160 ml   Filed Weights   07/14/16 1611 07/15/16 0411  Weight: 104.2 kg (229 lb 11.2 oz) 104.6 kg (230 lb 9.6 oz)    Examination:  General exam: Appears calm and comfortable  Respiratory system: Clear to auscultation. Respiratory effort normal. Cardiovascular system: S1 & S2 heard, RRR. No JVD, murmurs, rubs, gallops or clicks. No pedal edema. Gastrointestinal system: Abdomen is nondistended, soft and nontender. No organomegaly or masses felt. Normal bowel sounds heard. Central nervous system: Alert and oriented. No focal  neurological deficits. Extremities: Symmetric 5 x 5 power. Skin: jaundiced Psychiatry: Judgement and insight appear normal. Mood & affect appropriate.     Data Reviewed: I have personally reviewed following labs and imaging studies  CBC:  Recent Labs Lab 07/14/16 1110 07/15/16 0447  WBC 10.1 6.8  HGB 13.8 13.1  HCT 39.2 36.5*  MCV 95.6 92.6  PLT 144* 123*   Basic Metabolic Panel:  Recent Labs Lab 07/14/16 1110 07/15/16 0447  NA 128* 128*  K 3.3* 3.3*  CL 91* 95*  CO2 27 21*  GLUCOSE 138* 201*  BUN 18 19  CREATININE 1.27* 1.16  CALCIUM 8.8* 8.3*  MG  --  2.4   GFR: Estimated Creatinine Clearance: 91.3 mL/min (by C-G formula based on SCr of 1.16 mg/dL). Liver Function Tests:  Recent Labs Lab 07/14/16 1110 07/15/16 0447  AST 181* 152*  ALT 84* 74*  ALKPHOS 164* 156*  BILITOT 38.9* 33.1*  PROT 7.7 7.2  ALBUMIN 2.7* 2.5*   No results for input(s): LIPASE, AMYLASE in the last 168 hours. No results for input(s): AMMONIA in the last 168 hours. Coagulation Profile:  Recent Labs Lab 07/14/16 1110  INR 1.66   Cardiac Enzymes: No results for input(s): CKTOTAL, CKMB, CKMBINDEX, TROPONINI in the last 168 hours. BNP (last 3 results) No results for input(s): PROBNP in the last 8760 hours. HbA1C: No results for input(s): HGBA1C in the last 72 hours. CBG: No results for input(s): GLUCAP in the last 168 hours. Lipid Profile: No results for input(s): CHOL, HDL, LDLCALC, TRIG, CHOLHDL, LDLDIRECT in the last 72 hours. Thyroid Function Tests: No results for input(s): TSH, T4TOTAL, FREET4, T3FREE, THYROIDAB in the last 72 hours. Anemia Panel: No results for input(s): VITAMINB12, FOLATE, FERRITIN, TIBC, IRON, RETICCTPCT in the last 72 hours. Sepsis Labs: No results for input(s): PROCALCITON, LATICACIDVEN in the last 168 hours.  No results found for this or any previous visit (from the past 240 hour(s)).       Radiology Studies: [place] Abdomen Complete  Result  Date: 07/14/2016 CLINICAL DATA:  Jaundice and cirrhosis with elevated liver enzymes. EXAM: ABDOMEN ULTRASOUND COMPLETE COMPARISON:  None. FINDINGS: Gallbladder: Surgically absent. Common bile duct: Diameter: 5 mm. No demonstrable intrahepatic, common hepatic, or common bile duct dilatation. Liver: No focal lesion identified. The liver is nodular in contour with diffuse increased echogenicity. Liver measures approximately 18 cm in length. IVC: No abnormality visualized. Pancreas: Visualized portion unremarkable. Much of the pancreas is obscured by gas. Spleen: Size and appearance within normal limits. There is a small splenule adjacent to the spleen. Right Kidney: Length: 14.4 cm. Echogenicity within normal limits. No mass or hydronephrosis visualized. Left Kidney: Length: 13.1 cm. Echogenicity within normal limits. No mass or hydronephrosis visualized. Abdominal aorta: Most of the aorta is obscured by gas. Small portions of the proximal aorta which are visualized appear unremarkable. Other findings: There is mild  ascites in the upper abdomen region. IMPRESSION: Gallbladder absent. Liver is prominent with a nodular contour in diffuse increased echogenicity consistent with known cirrhosis. While no focal liver lesions are evident, it must be cautioned that the sensitivity of ultrasound for focal liver lesions is diminished significantly in this circumstance. Much of the pancreas is obscured by gas. Nearly all of the aorta is obscured by gas. There is mild ascites evident in the upper abdomen. Electronically Signed   By: Bretta Bang III M.D.   On: 07/14/2016 16:35        Scheduled Meds: . clonazePAM  0.5 mg Oral Daily  . folic acid  1 mg Oral Daily  . gabapentin  300 mg Oral TID  . heparin  5,000 Units Subcutaneous Q8H  . Influenza vac split quadrivalent PF  0.5 mL Intramuscular Tomorrow-1000  . lactulose  20 g Oral BID  . LORazepam  0-4 mg Intravenous Q6H   Followed by  . [START ON 07/16/2016]  LORazepam  0-4 mg Intravenous Q12H  . multivitamin with minerals  1 tablet Oral Daily  . pantoprazole  40 mg Oral BID AC  . prednisoLONE  40 mg Oral QAC breakfast  . propranolol  20 mg Oral Daily  . thiamine  100 mg Oral Daily   Continuous Infusions:    LOS: 1 day    Time spent: 25 minutes     Erick Blinks, MD [facility] If 7PM-7AM, please contact night-coverage [email or web address] Password [facility] 07/15/2016, 7:34 AM

## 2016-07-15 NOTE — Progress Notes (Signed)
Subjective:  Patient without complaints.   Objective: Vital signs in last 24 hours: Temp:  [97.5 F (36.4 C)-98.3 F (36.8 C)] 97.5 F (36.4 C) (10/18 0411) Pulse Rate:  [60-68] 61 (10/18 0411) Resp:  [16-20] 20 (10/18 0411) BP: (97-107)/(60-67) 100/67 (10/18 0411) SpO2:  [96 %-98 %] 98 % (10/18 0411) Weight:  [229 lb 11.2 oz (104.2 kg)-232 lb 9.6 oz (105.5 kg)] 230 lb 9.6 oz (104.6 kg) (10/18 0411) Last BM Date: 07/13/16 General:   Alert,  jaundiced pleasant and cooperative in NAD Head:  Normocephalic and atraumatic. Eyes:  Scleral icterus.  Abdomen:  Soft, distended, nontender. Normal bowel sounds, without guarding, and without rebound.   Extremities:  Without clubbing, deformity or edema. Neurologic:  Alert and  oriented x4;  grossly normal neurologically.No asterixis. Skin:  Intact without significant lesions or rashes. Psych:  Alert and cooperative. Normal mood and affect.  Intake/Output from previous day: 10/17 0701 - 10/18 0700 In: 240 [P.O.:240] Out: 400 [Urine:400] Intake/Output this shift: No intake/output data recorded.  Lab Results: CBC  Recent Labs  07/14/16 1110 07/15/16 0447  WBC 10.1 6.8  HGB 13.8 13.1  HCT 39.2 36.5*  MCV 95.6 92.6  PLT 144* 123*   BMET  Recent Labs  07/14/16 1110 07/15/16 0447  NA 128* 128*  K 3.3* 3.3*  CL 91* 95*  CO2 27 21*  GLUCOSE 138* 201*  BUN 18 19  CREATININE 1.27* 1.16  CALCIUM 8.8* 8.3*   LFTs  Recent Labs  07/14/16 1110 07/15/16 0447  BILITOT 38.9* 33.1*  BILIDIR  --  19.4*  IBILI  --  13.7*  ALKPHOS 164* 156*  AST 181* 152*  ALT 84* 74*  PROT 7.7 7.2  ALBUMIN 2.7* 2.5*   No results for input(s): LIPASE in the last 72 hours. PT/INR  Recent Labs  07/14/16 1110  LABPROT 19.8*  INR 1.66      Imaging Studies: US Abdomen Complete  Result Date: 07/14/2016 CLINICAL DATA:  Jaundice and cirrhosis with elevated liver enzymes. EXAM: ABDOMEN ULTRASOUND COMPLETE COMPARISON:  None. FINDINGS:  Gallbladder: Surgically absent. Common bile duct: Diameter: 5 mm. No demonstrable intrahepatic, common hepatic, or common bile duct dilatation. Liver: No focal lesion identified. The liver is nodular in contour with diffuse increased echogenicity. Liver measures approximately 18 cm in length. IVC: No abnormality visualized. Pancreas: Visualized portion unremarkable. Much of the pancreas is obscured by gas. Spleen: Size and appearance within normal limits. There is a small splenule adjacent to the spleen. Right Kidney: Length: 14.4 cm. Echogenicity within normal limits. No mass or hydronephrosis visualized. Left Kidney: Length: 13.1 cm. Echogenicity within normal limits. No mass or hydronephrosis visualized. Abdominal aorta: Most of the aorta is obscured by gas. Small portions of the proximal aorta which are visualized appear unremarkable. Other findings: There is mild ascites in the upper abdomen region. IMPRESSION: Gallbladder absent. Liver is prominent with a nodular contour in diffuse increased echogenicity consistent with known cirrhosis. While no focal liver lesions are evident, it must be cautioned that the sensitivity of ultrasound for focal liver lesions is diminished significantly in this circumstance. Much of the pancreas is obscured by gas. Nearly all of the aorta is obscured by gas. There is mild ascites evident in the upper abdomen. Electronically Signed   By: Bretta Bang III M.D.   On: 07/14/2016 16:35  [2 weeks]   Assessment: 50 year old male with known cirrhosis with varices presenting with new jaundice, onset approximately a week ago, in the presence of  notable significant ETOH intake; he states he has not had a drink in at least 6 days, however. Mild withdrawal symptoms first couple of days. Foggy feeling improved.   Labs indicated etoh hepatitis with DF of >60. Electrolyte abnormalities and bump in creatinine. Abd u/s with mild ascites and cirrhosis. No evidence of portal vein  thrombosis. No hepatoma. HCV ab and hep B surf ag both negative. Started on prednisolone yesterday.    He has had some mild improvement in his LFTs, Creatinine.  Plan: 1. Continue prednisolone 40mg  daily for four weeks, then taper over two weeks (total 6 weeks treatment).  2. Continue to follow LFTs for improvement.  3. Encouraged complete etoh cessation. Patient is interested but not sure how much assistance he wants. He may be interested in outpatient resources that would be available. Social work consult pending.  Leanna BattlesLeslie S. Dixon BoosLewis, PA-C [facility] [phone] 10/18/20179:49 AM     LOS: 1 day

## 2016-07-16 ENCOUNTER — Telehealth: Payer: Self-pay | Admitting: Gastroenterology

## 2016-07-16 DIAGNOSIS — R7989 Other specified abnormal findings of blood chemistry: Secondary | ICD-10-CM

## 2016-07-16 DIAGNOSIS — K7460 Unspecified cirrhosis of liver: Secondary | ICD-10-CM

## 2016-07-16 DIAGNOSIS — D689 Coagulation defect, unspecified: Secondary | ICD-10-CM

## 2016-07-16 DIAGNOSIS — E871 Hypo-osmolality and hyponatremia: Secondary | ICD-10-CM

## 2016-07-16 LAB — COMPREHENSIVE METABOLIC PANEL
ALBUMIN: 2.5 g/dL — AB (ref 3.5–5.0)
ALK PHOS: 167 U/L — AB (ref 38–126)
ALT: 77 U/L — AB (ref 17–63)
AST: 138 U/L — AB (ref 15–41)
Anion gap: 10 (ref 5–15)
BILIRUBIN TOTAL: 29.6 mg/dL — AB (ref 0.3–1.2)
BUN: 19 mg/dL (ref 6–20)
CO2: 24 mmol/L (ref 22–32)
CREATININE: 0.9 mg/dL (ref 0.61–1.24)
Calcium: 8.7 mg/dL — ABNORMAL LOW (ref 8.9–10.3)
Chloride: 97 mmol/L — ABNORMAL LOW (ref 101–111)
GFR calc Af Amer: >60 mL/min (ref >60)
GFR calc non Af Amer: >60 mL/min (ref >60)
GLUCOSE: 110 mg/dL — AB (ref 65–99)
POTASSIUM: 3.2 mmol/L — AB (ref 3.5–5.1)
Sodium: 131 mmol/L — ABNORMAL LOW (ref 135–145)
TOTAL PROTEIN: 7.1 g/dL (ref 6.5–8.1)

## 2016-07-16 LAB — CBC
HEMATOCRIT: 37.6 % — AB (ref 39.0–52.0)
Hemoglobin: 13.5 g/dL (ref 13.0–17.0)
MCH: 34.2 pg — ABNORMAL HIGH (ref 26.0–34.0)
MCHC: 35.9 g/dL (ref 30.0–36.0)
MCV: 95.2 fL (ref 78.0–100.0)
Platelets: 133 10*3/uL — ABNORMAL LOW (ref 150–400)
RBC: 3.95 MIL/uL — ABNORMAL LOW (ref 4.22–5.81)
RDW: 23.3 % — AB (ref 11.5–15.5)
WBC: 10.6 10*3/uL — ABNORMAL HIGH (ref 4.0–10.5)

## 2016-07-16 MED ORDER — NICOTINE 21 MG/24HR TD PT24
21.0000 mg | MEDICATED_PATCH | Freq: Every day | TRANSDERMAL | Status: DC
  Administered 2016-07-16: 21 mg via TRANSDERMAL
  Filled 2016-07-16: qty 1

## 2016-07-16 MED ORDER — POTASSIUM CHLORIDE CRYS ER 20 MEQ PO TBCR: 20.0000 meq | EXTENDED_RELEASE_TABLET | Freq: Every day | ORAL | 0 refills | Status: DC

## 2016-07-16 MED ORDER — PREDNISOLONE SODIUM PHOSPHATE 15 MG/5ML PO SOLN: 40.0000 mg | Freq: Every day | ORAL | 0 refills | Status: DC

## 2016-07-16 MED ORDER — POTASSIUM CHLORIDE CRYS ER 20 MEQ PO TBCR
40.0000 meq | EXTENDED_RELEASE_TABLET | ORAL | Status: AC
  Administered 2016-07-16 (×2): 40 meq via ORAL
  Filled 2016-07-16 (×2): qty 2

## 2016-07-16 MED ORDER — PREDNISOLONE SODIUM PHOSPHATE 10 MG PO TBDP: ORAL_TABLET | ORAL | 0 refills | Status: DC

## 2016-07-16 NOTE — Progress Notes (Signed)
Subjective:  Tolerating diet. No abdominal pain. BM normal. No melena, brbpr  Objective: Vital signs in last 24 hours: Temp:  [96.8 F (36 C)-97.8 F (36.6 C)] 97.6 F (36.4 C) (10/19 16100652) Pulse Rate:  [63-72] 63 (10/19 0652) Resp:  [18-20] 20 (10/19 [id number]) BP: (109-132)/(70-79) 121/72 (10/19 0652) SpO2:  [96 %-100 %] 100 % (10/19 54090652) Weight:  [233 lb 1.6 oz (105.7 kg)] 233 lb 1.6 oz (105.7 kg) (10/19 [id number]) Last BM Date: 07/16/16 General:   Alert,  jaundiced, pleasant and cooperative in NAD Head:  Normocephalic and atraumatic. Eyes:  Scleral icterus.  Abdomen:  Soft, obese, nontender. Normal bowel sounds, without guarding, and without rebound.   Extremities:  Without clubbing, deformity or edema. Neurologic:  Alert and  oriented x4;  grossly normal neurologically. Skin:  Intact without significant lesions or rashes. Psych:  Alert and cooperative. Normal mood and affect.  Intake/Output from previous day: 10/18 0701 - 10/19 0700 In: 240 [P.O.:240] Out: 700 [Urine:700] Intake/Output this shift: No intake/output data recorded.  Lab Results: CBC  Recent Labs  07/14/16 1110 07/15/16 0447 07/16/16 0705  WBC 10.1 6.8 10.6*  HGB 13.8 13.1 13.5  HCT 39.2 36.5* 37.6*  MCV 95.6 92.6 95.2  PLT 144* 123* 133*   BMET  Recent Labs  07/14/16 1110 07/15/16 0447 07/16/16 0705  NA 128* 128* 131*  K 3.3* 3.3* 3.2*  CL 91* 95* 97*  CO2 27 21* 24  GLUCOSE 138* 201* 110*  BUN 18 19 19   CREATININE 1.27* 1.16 0.90  CALCIUM 8.8* 8.3* 8.7*   LFTs  Recent Labs  07/14/16 1110 07/15/16 0447 07/16/16 0705  BILITOT 38.9* 33.1* 29.6*  BILIDIR  --  19.4*  --   IBILI  --  13.7*  --   ALKPHOS 164* 156* 167*  AST 181* 152* 138*  ALT 84* 74* 77*  PROT 7.7 7.2 7.1  ALBUMIN 2.7* 2.5* 2.5*   No results for input(s): LIPASE in the last 72 hours. PT/INR  Recent Labs  07/14/16 1110  LABPROT 19.8*  INR 1.66      Imaging Studies: [place] Abdomen Complete  Result Date:  07/14/2016 CLINICAL DATA:  Jaundice and cirrhosis with elevated liver enzymes. EXAM: ABDOMEN ULTRASOUND COMPLETE COMPARISON:  None. FINDINGS: Gallbladder: Surgically absent. Common bile duct: Diameter: 5 mm. No demonstrable intrahepatic, common hepatic, or common bile duct dilatation. Liver: No focal lesion identified. The liver is nodular in contour with diffuse increased echogenicity. Liver measures approximately 18 cm in length. IVC: No abnormality visualized. Pancreas: Visualized portion unremarkable. Much of the pancreas is obscured by gas. Spleen: Size and appearance within normal limits. There is a small splenule adjacent to the spleen. Right Kidney: Length: 14.4 cm. Echogenicity within normal limits. No mass or hydronephrosis visualized. Left Kidney: Length: 13.1 cm. Echogenicity within normal limits. No mass or hydronephrosis visualized. Abdominal aorta: Most of the aorta is obscured by gas. Small portions of the proximal aorta which are visualized appear unremarkable. Other findings: There is mild ascites in the upper abdomen region. IMPRESSION: Gallbladder absent. Liver is prominent with a nodular contour in diffuse increased echogenicity consistent with known cirrhosis. While no focal liver lesions are evident, it must be cautioned that the sensitivity of ultrasound for focal liver lesions is diminished significantly in this circumstance. Much of the pancreas is obscured by gas. Nearly all of the aorta is obscured by gas. There is mild ascites evident in the upper abdomen. Electronically Signed   By: Bretta BangWilliam  Woodruff III M.D.  On: 07/14/2016 16:35  [2 weeks]   Assessment: 50 year old male with known cirrhosis with varices presenting with new jaundice, onset approximately a week ago prior to admission, in the presence of notable significant ETOH intake; he states he has not had a drink in at least 6 days prior to admission. Mild withdrawal symptoms first couple of days. Foggy feeling improved.    Labs indicated etoh hepatitis with DF of >60. Electrolyte abnormalities and bump in creatinine. Abd u/s with mild ascites and cirrhosis. No evidence of portal vein thrombosis. No hepatoma. HCV ab and hep B surf ag both negative. This is day 3 of prednisolone. LFTs have improved, bilirubin falling. Creatinine normal, remains with hypokalemia. Noted to have mild leukocytosis today without any indication of infection. He has minimal ascites on ultrasound. Likely related to prednisolone.  Plan: 1. Appreciate assistance from [employer]. Patient motivated to abstain from alcohol. Resources in place. 2. Prednisolone 40 mg daily for 4 weeks, then taper over 2 weeks (total 6 weeks treatment). Patient aware. 3. Continue to monitor LFTs for ongoing improvement, frequency can be decreased trend downward. Would recommend checking LFTs 1 week after discharge. 4. Management of electrolyte issues per attending. From a GI standpoint patient is stable for discharge. 5. Will follow peripherally.  Leanna Battles. Dixon Boos [facility] [phone] 10/19/20179:21 AM      LOS: 2 days

## 2016-07-16 NOTE — Addendum Note (Signed)
Addended by: Myra RudeLAWSON, Patrisia Faeth H on: 07/16/2016 11:42 AM   Modules accepted: Orders

## 2016-07-16 NOTE — Progress Notes (Signed)
CRITICAL VALUE ALERT  Critical value received:  Total Bilirubin 29.6  Date of notification:  07/16/2016  Time of notification: 0800  Critical value read back:YES  Nurse who received alert:  Johnsie CancelLisa Sonam Wandel  MD notified (1st page):  [phone]  Time of first page:  0805

## 2016-07-16 NOTE — Care Management Note (Signed)
Case Management Note  Patient Details  Name: Joseph EstimableMichael Kauer MRN: [id number] Date of Birth: 11-28-1965  Subjective/Objective:                  Pt admitted with ETOH hepatitis. He lives alone and is ind with ALD's. He has parents who provide support. Pt pays OOP for meds PTA. Pt has PCP and GI care PTA and will f/u after DC. Pt will be able to afford Rx OOP after DC. Pt discharging home today with self care.  Action/Plan: No CM needs.   Expected Discharge Date:  07/16/16               Expected Discharge Plan:  Home/Self Care  In-House Referral:  NA  Discharge planning Services  NA  Post Acute Care Choice:  NA Choice offered to:  NA  Status of Service:  Completed, signed off   Malcolm MetroChildress, Rielynn Trulson Demske, RN 07/16/2016, 12:17 PM

## 2016-07-16 NOTE — Progress Notes (Signed)
Received critical lab value. Patient's Total Bilirubin level 29.6 MD notified via E-Page.

## 2016-07-16 NOTE — Telephone Encounter (Signed)
Orders mailed to the pt.

## 2016-07-16 NOTE — Telephone Encounter (Signed)
Patient needs CMET, PT/INR in one week. Please arrange.

## 2016-07-16 NOTE — Discharge Summary (Addendum)
Physician Discharge Summary  Joseph Cortez EAV:[id number] DOB: May 11, 1966 DOA: 07/14/2016  PCP: Kirstie Peri, MD  Admit date: 07/14/2016 Discharge date: 07/16/2016  Admitted From: home Disposition:  home  Recommendations for Outpatient Follow-up:  1. Follow up with PCP in 1-2 weeks 2. Repeat LFTs in one week 3. Follow up with gastroenterology in 1-2 weeks  Home Health: Equipment/Devices:  Discharge Condition: stable CODE STATUS: full Diet recommendation: Heart Healthy   Brief/Interim Summary: 50 year old male with history of alcohol abuse and associated cirrhosis, history of esophageal varices was directly admitted from the GI clinic for worsening jaundice. Symptoms have been present approximately one week prior to admission. He was found have a bilirubin of 38.9. Mild AKI associated with dehydration. Discriminate function on admission was noted to be greater than 60. Abdominal ultrasound showed mild ascites and cirrhosis, no obstructive findings. Recent viral hepatitis panel was found to be negative. He was seen by gastroenterology and was started on prednisone therapy. Bilirubin is slowly trending down. Patient is feeling better. Patient is planned to receive 4 weeks of prednisone therapy with a 2 week taper to follow. He will follow up with gastroenterology in the next week for repeat LFTs. The patient is feeling better and requests discharge home. He was seen by social work for alcohol abuse and is motivated to stop drinking. He received resources to help him stop drinking. Patient is otherwise stable for discharge.  Discharge Diagnoses:  Principal Problem:   Alcoholic hepatitis with ascites Active Problems:   Hepatic cirrhosis (HCC)   Portal hypertensive gastropathy   Esophageal varices without bleeding (HCC)   AKI (acute kidney injury) (HCC)   Hyponatremia   Hypokalemia   Alcohol abuse   Anxiety   Coagulopathy (HCC)   Thrombocytopenia (HCC)   Elevated LFTs    Discharge  Instructions  Discharge Instructions    Diet - low sodium heart healthy    Complete by:  As directed    Increase activity slowly    Complete by:  As directed        Medication List    TAKE these medications   clonazePAM 0.5 MG tablet Commonly known as:  KLONOPIN Take 0.5 mg by mouth at bedtime.   folic acid 1 MG tablet Commonly known as:  FOLVITE Take 1 tablet (1 mg total) by mouth daily. Reported on 03/12/2016   furosemide 20 MG tablet Commonly known as:  LASIX Take 1 tablet (20 mg total) by mouth daily.   gabapentin 300 MG capsule Commonly known as:  NEURONTIN Take 600 mg by mouth 2 (two) times daily.   lactulose 10 GM/15ML solution Commonly known as:  CHRONULAC Take 30 mLs (20 g total) by mouth 2 (two) times daily. What changed:  how much to take   multivitamin with minerals Tabs tablet Take 1 tablet by mouth daily.   naltrexone 50 MG tablet Commonly known as:  DEPADE Take 50 mg by mouth daily.   pantoprazole 40 MG tablet Commonly known as:  PROTONIX Take 1 tablet (40 mg total) by mouth 2 (two) times daily before a meal.   potassium chloride SA 20 MEQ tablet Commonly known as:  K-DUR,KLOR-CON Take 1 tablet (20 mEq total) by mouth daily.   prednisoLONE 10 MG disintegrating tablet Commonly known as:  ORAPRED ODT Take 40mg  po daily for 4 weeks then decrease by 10mg  every 4 days until complete   propranolol 20 MG tablet Commonly known as:  INDERAL Take 20 mg by mouth daily.   spironolactone 50 MG  tablet Commonly known as:  ALDACTONE Take 1 tablet (50 mg total) by mouth daily.   thiamine 100 MG tablet Take 100 mg by mouth daily.      Follow-up Information    Eula Listenobert Rourk, MD .   Specialty:  Gastroenterology Why:  they will call you for blood testing in 1 week Contact information: [address] [place] [address] [phone]          Allergies  Allergen Reactions  . Sertraline Other (See Comments)    Suicidal ideations     Consultations:  gastroenterology   Procedures/Studies: [place] Abdomen Complete  Result Date: 07/14/2016 CLINICAL DATA:  Jaundice and cirrhosis with elevated liver enzymes. EXAM: ABDOMEN ULTRASOUND COMPLETE COMPARISON:  None. FINDINGS: Gallbladder: Surgically absent. Common bile duct: Diameter: 5 mm. No demonstrable intrahepatic, common hepatic, or common bile duct dilatation. Liver: No focal lesion identified. The liver is nodular in contour with diffuse increased echogenicity. Liver measures approximately 18 cm in length. IVC: No abnormality visualized. Pancreas: Visualized portion unremarkable. Much of the pancreas is obscured by gas. Spleen: Size and appearance within normal limits. There is a small splenule adjacent to the spleen. Right Kidney: Length: 14.4 cm. Echogenicity within normal limits. No mass or hydronephrosis visualized. Left Kidney: Length: 13.1 cm. Echogenicity within normal limits. No mass or hydronephrosis visualized. Abdominal aorta: Most of the aorta is obscured by gas. Small portions of the proximal aorta which are visualized appear unremarkable. Other findings: There is mild ascites in the upper abdomen region. IMPRESSION: Gallbladder absent. Liver is prominent with a nodular contour in diffuse increased echogenicity consistent with known cirrhosis. While no focal liver lesions are evident, it must be cautioned that the sensitivity of ultrasound for focal liver lesions is diminished significantly in this circumstance. Much of the pancreas is obscured by gas. Nearly all of the aorta is obscured by gas. There is mild ascites evident in the upper abdomen. Electronically Signed   By: Bretta BangWilliam  Woodruff III M.D.   On: 07/14/2016 16:35      Subjective:  Feeling better. No abdominal pain  Discharge Exam: Vitals:   07/16/16 0011 07/16/16 0652  BP: 132/78 121/72  Pulse: 63 63  Resp: 20 20  Temp: 97.8 F (36.6 C) 97.6 F (36.4 C)   Vitals:   07/15/16 1211 07/15/16 1811  07/16/16 0011 07/16/16 0652  BP: 109/73 120/70 132/78 121/72  Pulse: 69 65 63 63  Resp: 18 20 20 20   Temp: (!) 96.8 F (36 C) 97.5 F (36.4 C) 97.8 F (36.6 C) 97.6 F (36.4 C)  TempSrc: Oral Oral Oral Oral  SpO2: 98% 96% 99% 100%  Weight:    105.7 kg (233 lb 1.6 oz)  Height:        General: Pt is alert, awake, not in acute distress, deeply jaundiced Cardiovascular: RRR, S1/S2 +, no rubs, no gallops Respiratory: CTA bilaterally, no wheezing, no rhonchi Abdominal: Soft, NT, distended, bowel sounds + Extremities: no edema, no cyanosis    The results of significant diagnostics from this hospitalization (including imaging, microbiology, ancillary and laboratory) are listed below for reference.     Microbiology: No results found for this or any previous visit (from the past 240 hour(s)).   Labs: BNP (last 3 results) No results for input(s): BNP in the last 8760 hours. Basic Metabolic Panel:  Recent Labs Lab 07/14/16 1110 07/15/16 0447 07/16/16 0705  NA 128* 128* 131*  K 3.3* 3.3* 3.2*  CL 91* 95* 97*  CO2 27 21* 24  GLUCOSE 138* 201* 110*  BUN 18 19 19   CREATININE 1.27* 1.16 0.90  CALCIUM 8.8* 8.3* 8.7*  MG  --  2.4  --    Liver Function Tests:  Recent Labs Lab 07/14/16 1110 07/15/16 0447 07/16/16 0705  AST 181* 152* 138*  ALT 84* 74* 77*  ALKPHOS 164* 156* 167*  BILITOT 38.9* 33.1* 29.6*  PROT 7.7 7.2 7.1  ALBUMIN 2.7* 2.5* 2.5*   No results for input(s): LIPASE, AMYLASE in the last 168 hours. No results for input(s): AMMONIA in the last 168 hours. CBC:  Recent Labs Lab 07/14/16 1110 07/15/16 0447 07/16/16 0705  WBC 10.1 6.8 10.6*  HGB 13.8 13.1 13.5  HCT 39.2 36.5* 37.6*  MCV 95.6 92.6 95.2  PLT 144* 123* 133*   Cardiac Enzymes: No results for input(s): CKTOTAL, CKMB, CKMBINDEX, TROPONINI in the last 168 hours. BNP: Invalid input(s): POCBNP CBG: No results for input(s): GLUCAP in the last 168 hours. D-Dimer No results for input(s):  DDIMER in the last 72 hours. Hgb A1c No results for input(s): HGBA1C in the last 72 hours. Lipid Profile No results for input(s): CHOL, HDL, LDLCALC, TRIG, CHOLHDL, LDLDIRECT in the last 72 hours. Thyroid function studies No results for input(s): TSH, T4TOTAL, T3FREE, THYROIDAB in the last 72 hours.  Invalid input(s): FREET3 Anemia work up No results for input(s): VITAMINB12, FOLATE, FERRITIN, TIBC, IRON, RETICCTPCT in the last 72 hours. Urinalysis No results found for: COLORURINE, APPEARANCEUR, LABSPEC, PHURINE, GLUCOSEU, HGBUR, BILIRUBINUR, KETONESUR, PROTEINUR, UROBILINOGEN, NITRITE, LEUKOCYTESUR Sepsis Labs Invalid input(s): PROCALCITONIN,  WBC,  LACTICIDVEN Microbiology No results found for this or any previous visit (from the past 240 hour(s)).   Time coordinating discharge: Over 30 minutes  SIGNED:   Erick Blinks, MD  [facility] 07/16/2016, 11:32 AM Pager   If 7PM-7AM, please contact night-coverage [email or web address] Password [email or web address]

## 2016-07-16 NOTE — Progress Notes (Signed)
Pt's IV catheter removed and intact. Pt's IV site clean dry and intact. Discharge instructions including medications and follow up appointments were reviewed and discussed with patient. Pt verbalized understanding of discharge instructions including medications and follow up appointments. All questions were answered and no further questions at this time. Pt in stable condition and in no acute distress at time of discharge. Pt will be escorted by nurse tech.  

## 2016-07-21 ENCOUNTER — Ambulatory Visit: Payer: Self-pay | Admitting: Gastroenterology

## 2016-07-22 ENCOUNTER — Telehealth: Payer: Self-pay | Admitting: Gastroenterology

## 2016-07-22 NOTE — Telephone Encounter (Signed)
Patient needs appt with me in 2 weeks for hospital follow-up, if not already arranged. Thanks!

## 2016-07-23 NOTE — Telephone Encounter (Signed)
I had to use an urgent visit appointment for 08/04/16 and sister is aware of date and time

## 2016-07-25 ENCOUNTER — Encounter (HOSPITAL_COMMUNITY): Payer: Self-pay

## 2016-07-25 ENCOUNTER — Emergency Department (HOSPITAL_COMMUNITY): Payer: Self-pay

## 2016-07-25 ENCOUNTER — Emergency Department (HOSPITAL_COMMUNITY)
Admission: EM | Admit: 2016-07-25 | Discharge: 2016-07-25 | Disposition: A | Discharge status: Home / Self Care | Payer: Self-pay | Attending: Emergency Medicine | Admitting: Emergency Medicine

## 2016-07-25 DIAGNOSIS — Z79899 Other long term (current) drug therapy: Secondary | ICD-10-CM | POA: Insufficient documentation

## 2016-07-25 DIAGNOSIS — R0602 Shortness of breath: Secondary | ICD-10-CM | POA: Insufficient documentation

## 2016-07-25 DIAGNOSIS — R1011 Right upper quadrant pain: Secondary | ICD-10-CM | POA: Insufficient documentation

## 2016-07-25 DIAGNOSIS — F17210 Nicotine dependence, cigarettes, uncomplicated: Secondary | ICD-10-CM | POA: Insufficient documentation

## 2016-07-25 LAB — URINALYSIS, ROUTINE W REFLEX MICROSCOPIC
Glucose, UA: 100 mg/dL — AB
Hgb urine dipstick: NEGATIVE
Ketones, ur: NEGATIVE mg/dL
LEUKOCYTES UA: NEGATIVE
Nitrite: NEGATIVE
PROTEIN: NEGATIVE mg/dL
SPECIFIC GRAVITY, URINE: 1.015 (ref 1.005–1.030)
pH: 6 (ref 5.0–8.0)

## 2016-07-25 LAB — CBC WITH DIFFERENTIAL/PLATELET
BASOS ABS: 0 10*3/uL (ref 0.0–0.1)
BASOS PCT: 0 %
EOS ABS: 0.2 10*3/uL (ref 0.0–0.7)
Eosinophils Relative: 1 %
HCT: 37.9 % — ABNORMAL LOW (ref 39.0–52.0)
Hemoglobin: 13.3 g/dL (ref 13.0–17.0)
LYMPHS PCT: 13 %
Lymphs Abs: 2.1 10*3/uL (ref 0.7–4.0)
MCH: 34.9 pg — ABNORMAL HIGH (ref 26.0–34.0)
MCHC: 35.1 g/dL (ref 30.0–36.0)
MCV: 99.5 fL (ref 78.0–100.0)
MONO ABS: 0.8 10*3/uL (ref 0.1–1.0)
Monocytes Relative: 5 %
NEUTROS PCT: 81 %
Neutro Abs: 13.1 10*3/uL — ABNORMAL HIGH (ref 1.7–7.7)
PLATELETS: 87 10*3/uL — AB (ref 150–400)
RBC: 3.81 MIL/uL — ABNORMAL LOW (ref 4.22–5.81)
RDW: 17.9 % — AB (ref 11.5–15.5)
SMEAR REVIEW: DECREASED
WBC: 16.2 10*3/uL — AB (ref 4.0–10.5)

## 2016-07-25 LAB — LIPASE, BLOOD: LIPASE: 38 U/L (ref 11–51)

## 2016-07-25 LAB — BRAIN NATRIURETIC PEPTIDE: B Natriuretic Peptide: 62 pg/mL (ref 0.0–100.0)

## 2016-07-25 LAB — PROTIME-INR
INR: 1.38
PROTHROMBIN TIME: 17.1 s — AB (ref 11.4–15.2)

## 2016-07-25 LAB — COMPREHENSIVE METABOLIC PANEL
ALT: 105 U/L — ABNORMAL HIGH (ref 17–63)
AST: 147 U/L — AB (ref 15–41)
Albumin: 3 g/dL — ABNORMAL LOW (ref 3.5–5.0)
Alkaline Phosphatase: 165 U/L — ABNORMAL HIGH (ref 38–126)
Anion gap: 10 (ref 5–15)
BUN: 14 mg/dL (ref 6–20)
CHLORIDE: 98 mmol/L — AB (ref 101–111)
CO2: 22 mmol/L (ref 22–32)
Calcium: 8.5 mg/dL — ABNORMAL LOW (ref 8.9–10.3)
Creatinine, Ser: 1.07 mg/dL (ref 0.61–1.24)
Glucose, Bld: 107 mg/dL — ABNORMAL HIGH (ref 65–99)
POTASSIUM: 3.1 mmol/L — AB (ref 3.5–5.1)
SODIUM: 130 mmol/L — AB (ref 135–145)
Total Bilirubin: 11.1 mg/dL — ABNORMAL HIGH (ref 0.3–1.2)
Total Protein: 7.5 g/dL (ref 6.5–8.1)

## 2016-07-25 LAB — MAGNESIUM: MAGNESIUM: 1.8 mg/dL (ref 1.7–2.4)

## 2016-07-25 LAB — APTT: aPTT: 35 seconds (ref 24–36)

## 2016-07-25 LAB — AMMONIA: AMMONIA: 31 umol/L (ref 9–35)

## 2016-07-25 MED ORDER — MORPHINE SULFATE (PF) 4 MG/ML IV SOLN
4.0000 mg | Freq: Once | INTRAVENOUS | Status: AC
Start: 2016-07-25 — End: 2016-07-25
  Administered 2016-07-25: 4 mg via INTRAVENOUS
  Filled 2016-07-25: qty 1

## 2016-07-25 MED ORDER — PREDNISONE 20 MG PO TABS
40.0000 mg | ORAL_TABLET | Freq: Once | ORAL | Status: AC
  Administered 2016-07-25: 40 mg via ORAL
  Filled 2016-07-25: qty 2

## 2016-07-25 MED ORDER — PREDNISONE 20 MG PO TABS: 40.0000 mg | ORAL_TABLET | Freq: Every day | ORAL | 0 refills | Status: DC

## 2016-07-25 MED ORDER — ONDANSETRON HCL 4 MG/2ML IJ SOLN
INTRAMUSCULAR | Status: AC
  Administered 2016-07-25: 4 mg
  Filled 2016-07-25: qty 2

## 2016-07-25 MED ORDER — POTASSIUM CHLORIDE CRYS ER 20 MEQ PO TBCR
40.0000 meq | EXTENDED_RELEASE_TABLET | Freq: Once | ORAL | Status: AC
  Administered 2016-07-25: 40 meq via ORAL
  Filled 2016-07-25: qty 2

## 2016-07-25 NOTE — ED Triage Notes (Signed)
Patient has abdominal pain and swelling with history of cirrhosis.  Abdomen has been swelling since Wednesday, but has became tight over the past couple of days per sister.  I have also been having some bleeding from nose and rectum, trouble breathing, and a decrease in urinary output.

## 2016-07-25 NOTE — ED Provider Notes (Signed)
[facility] DEPT Provider Note   CSN: [id number] Arrival date & time: 07/25/16  1933     History   Chief Complaint Chief Complaint  Patient presents with  . Abdominal Pain    HPI Joseph Cortez is a 50 y.o. male.  HPI    Joseph Cortez is a 50 y.o. male, with a history of alcoholic cirrhosis and hepatitis, presenting to the ED with upper abdominal pain for the last three days. Pain is worse in the RUQ, burning in nature, rated 10/10, radiating to the left lower back. Accompanied by some abdominal distension, decreased urination, exertional shortness of breath, and some increased peripheral edema. Denies fever/chills, N/V/D, chest pain, or any other complaints. Patient is accompanied by his sister at the bedside. Sister states that the patient's level of jaundice is consistent with his previous jaundice. Pt admits to previous regular alcohol use, but denies alcohol use for the last three weeks. States before this he would drink multiple fifths of liquor a day or the wine or beer equivalents.   Patient was admitted from October 17-19 for acute alcoholic hepatitis with acute kidney injury. Patient is followed by Dr. Jena Gauss.    Past Medical History:  Diagnosis Date  . Anxiety   . Cirrhosis (HCC)   . Depression   . Neuromuscular disorder (HCC)    Neuropathy  . Neuropathy [facility])     Patient Active Problem List   Diagnosis Date Noted  . Alcoholic hepatitis with ascites 07/14/2016  . AKI (acute kidney injury) (HCC) 07/14/2016  . Hyponatremia 07/14/2016  . Hypokalemia 07/14/2016  . Alcohol abuse 07/14/2016  . Anxiety 07/14/2016  . Coagulopathy (HCC) 07/14/2016  . Thrombocytopenia (HCC) 07/14/2016  . Elevated LFTs   . Esophageal varices without bleeding (HCC)   . Portal hypertensive gastropathy   . Duodenal ulcer   . Reflux esophagitis   . Hepatic cirrhosis (HCC) 06/10/2015    Past Surgical History:  Procedure Laterality Date  . BIOPSY  06/27/2015   Procedure: BIOPSY  (Gastric);  Surgeon: Corbin Ade, MD;  Location: [facility] ORS;  Service: Endoscopy;;  . CHOLECYSTECTOMY    . ESOPHAGEAL BANDING N/A 01/30/2016   Procedure: ESOPHAGEAL BANDING;  Surgeon: Corbin Ade, MD;  Location: [facility] ENDO SUITE;  Service: Endoscopy;  Laterality: N/A;  . ESOPHAGOGASTRODUODENOSCOPY  January 06, 2016   Dr. Alycia Rossetti at [facility]: duodenitis in bullb, portal gastropathy, 2 columns of large distal esophageal varices, one with flat red spot, s/p band ligation. Needs 4 week surveillance EGD   . ESOPHAGOGASTRODUODENOSCOPY (EGD) WITH PROPOFOL N/A 06/27/2015   WUJ:WJXB erosive reflux esophagitis, portal gastropathy, duodenal bulbar ulcer, negative H.pylori. Surveillance due yearly according to ASGE current guidelines regarding ETOH cirrhosis and ongoing ETOH abuse  . ESOPHAGOGASTRODUODENOSCOPY (EGD) WITH PROPOFOL N/A 01/30/2016   Dr. Jena Gauss: Grade 2 varices s/p banding with complete deflation of varices, portal gastropathy, surveillance in May 2018       Home Medications    Prior to Admission medications   Medication Sig Start Date End Date Taking? Authorizing Provider  clonazePAM (KLONOPIN) 0.5 MG tablet Take 0.5 mg by mouth at bedtime.    Yes Historical Provider, MD  folic acid (FOLVITE) 1 MG tablet Take 1 tablet (1 mg total) by mouth daily. Reported on 03/12/2016 03/13/16  Yes Gelene Mink, NP  furosemide (LASIX) 20 MG tablet Take 1 tablet (20 mg total) by mouth daily. Patient taking differently: Take 20 mg by mouth once as needed for fluid.  03/12/16  Yes Lessie Dings  Lissa HoardBoone, NP  gabapentin (NEURONTIN) 300 MG capsule Take 300-600 mg by mouth 2 (two) times daily. 300mg  in the morning and 600mg  at bedtime   Yes Historical Provider, MD  lactulose (CHRONULAC) 10 GM/15ML solution Take 30 mLs (20 g total) by mouth 2 (two) times daily. Patient taking differently: Take 10 g by mouth 2 (two) times daily.  01/29/16  Yes Gelene MinkAnna W Boone, NP  loperamide (IMODIUM) 2 MG capsule Take 2 mg by mouth as needed for diarrhea or  loose stools.   Yes Historical Provider, MD  Multiple Vitamin (MULTIVITAMIN WITH MINERALS) TABS tablet Take 1 tablet by mouth daily.   Yes Historical Provider, MD  pantoprazole (PROTONIX) 40 MG tablet Take 1 tablet (40 mg total) by mouth 2 (two) times daily before a meal. 01/29/16  Yes Gelene MinkAnna W Boone, NP  potassium chloride SA (K-DUR,KLOR-CON) 20 MEQ tablet Take 1 tablet (20 mEq total) by mouth daily. 07/16/16  Yes Erick BlinksJehanzeb Memon, MD  thiamine 100 MG tablet Take 100 mg by mouth daily.  01/08/16  Yes Historical Provider, MD  naltrexone (DEPADE) 50 MG tablet Take 50 mg by mouth daily.     Historical Provider, MD  prednisoLONE (ORAPRED ODT) 10 MG disintegrating tablet Take 40mg  po daily for 4 weeks then decrease by 10mg  every 4 days until complete Patient not taking: Reported on 07/25/2016 07/16/16   Erick BlinksJehanzeb Memon, MD  predniSONE (DELTASONE) 20 MG tablet Take 2 tablets (40 mg total) by mouth daily with breakfast. 07/25/16 08/15/16  Brittinie Wherley C Iban Utz, PA-C  propranolol (INDERAL) 20 MG tablet Take 20 mg by mouth daily.    Historical Provider, MD  spironolactone (ALDACTONE) 50 MG tablet Take 1 tablet (50 mg total) by mouth daily. Patient not taking: Reported on 07/25/2016 03/12/16   Gelene MinkAnna W Boone, NP    Family History Family History  Problem Relation Age of Onset  . Colon cancer Maternal Uncle     Social History Social History  Substance Use Topics  . Smoking status: Current Every Day Smoker    Packs/day: 0.50    Types: Cigarettes  . Smokeless tobacco: Never Used  . Alcohol use No     Comment: 1/5th bottle wine every day; stopped drinking October 18th      Allergies   Sertraline   Review of Systems Review of Systems  Constitutional: Negative for chills, diaphoresis and fever.  Respiratory: Negative for cough.   Cardiovascular: Negative for chest pain.  Gastrointestinal: Positive for abdominal distention, abdominal pain and blood in stool. Negative for diarrhea, nausea and vomiting.  All other  systems reviewed and are negative.    Physical Exam Updated Vital Signs BP 133/85 (BP Location: Right Arm)   Pulse 86   Temp 98.2 F (36.8 C) (Oral)   Resp 20   Ht 5\' 8"  (1.727 m)   Wt 110.2 kg   SpO2 100%   BMI 36.95 kg/m   Physical Exam  Constitutional: He appears well-developed and well-nourished. No distress.  HENT:  Head: Normocephalic and atraumatic.  Eyes: Conjunctivae are normal. Scleral icterus is present.  Neck: Neck supple.  Cardiovascular: Normal rate, regular rhythm, normal heart sounds and intact distal pulses.   Pulmonary/Chest: Effort normal and breath sounds normal. No respiratory distress.  Abdominal: He exhibits distension. There is tenderness in the right upper quadrant. There is no guarding.  Abdomen is distended and tight, with evidence of ascites. Right upper quadrant tenderness appears to be mild and expressed by the patient only verbally.  Musculoskeletal: He  exhibits no edema or tenderness.  Lymphadenopathy:    He has no cervical adenopathy.  Neurological: He is alert.  Skin: Skin is warm and dry. He is not diaphoretic.  Jaundiced  Psychiatric: He has a normal mood and affect. His behavior is normal.  Nursing note and vitals reviewed.    ED Treatments / Results  Labs (all labs ordered are listed, but only abnormal results are displayed) Labs Reviewed  CBC WITH DIFFERENTIAL/PLATELET - Abnormal; Notable for the following:       Result Value   WBC 16.2 (*)    RBC 3.81 (*)    HCT 37.9 (*)    MCH 34.9 (*)    RDW 17.9 (*)    Platelets 87 (*)    Neutro Abs 13.1 (*)    All other components within normal limits  COMPREHENSIVE METABOLIC PANEL - Abnormal; Notable for the following:    Sodium 130 (*)    Potassium 3.1 (*)    Chloride 98 (*)    Glucose, Bld 107 (*)    Calcium 8.5 (*)    Albumin 3.0 (*)    AST 147 (*)    ALT 105 (*)    Alkaline Phosphatase 165 (*)    Total Bilirubin 11.1 (*)    All other components within normal limits    URINALYSIS, ROUTINE W REFLEX MICROSCOPIC (NOT AT [facility]) - Abnormal; Notable for the following:    Color, Urine AMBER (*)    Glucose, UA 100 (*)    Bilirubin Urine MODERATE (*)    All other components within normal limits  PROTIME-INR - Abnormal; Notable for the following:    Prothrombin Time 17.1 (*)    All other components within normal limits  LIPASE, BLOOD  AMMONIA  MAGNESIUM  APTT  BRAIN NATRIURETIC PEPTIDE   ALT  Date Value Ref Range Status  07/25/2016 105 (H) 17 - 63 U/L Final  07/16/2016 77 (H) 17 - 63 U/L Final  07/15/2016 74 (H) 17 - 63 U/L Final  07/14/2016 84 (H) 17 - 63 U/L Final    AST  Date Value Ref Range Status  07/25/2016 147 (H) 15 - 41 U/L Final  07/16/2016 138 (H) 15 - 41 U/L Final  07/15/2016 152 (H) 15 - 41 U/L Final  07/14/2016 181 (H) 15 - 41 U/L Final    EKG  EKG Interpretation  Date/Time:  Saturday July 25 2016 20:26:23 EDT Ventricular Rate:  89 PR Interval:    QRS Duration: 95 QT Interval:  374 QTC Calculation: 456 R Axis:   -32 Text Interpretation:  Sinus rhythm Left axis deviation Low voltage, precordial leads Baseline wander in lead(s) V1 No significant change since last tracing Confirmed by Ethelda Chick  MD, SAM [phone]) on 07/26/2016 3:02:08 PM       Radiology Dg Chest 2 View  Result Date: 07/25/2016 CLINICAL DATA:  Abdominal pain and swelling history of cirrhosis EXAM: CHEST  2 VIEW COMPARISON:  01/05/2016 FINDINGS: Tiny left-sided pleural effusion. Hazy basilar atelectasis on the left. No consolidation. Stable cardiomediastinal silhouette. Low lung volumes. No pneumothorax. Oval nodular opacity at the right lower lung zone could relate to a nipple shadow. IMPRESSION: Tiny left pleural effusion with mild left basilar atelectasis. Electronically Signed   By: Jasmine Pang M.D.   On: 07/25/2016 21:21    Procedures Procedures (including critical care time)  Medications Ordered in ED Medications  morphine 4 MG/ML injection 4 mg (4  mg Intravenous Given 07/25/16 2047)  ondansetron (ZOFRAN) 4  MG/2ML injection (4 mg  Given 07/25/16 2056)  potassium chloride SA (K-DUR,KLOR-CON) CR tablet 40 mEq (40 mEq Oral Given 07/25/16 2138)  predniSONE (DELTASONE) tablet 40 mg (40 mg Oral Given 07/25/16 2226)     Initial Impression / Assessment and Plan / ED Course  I have reviewed the triage vital signs and the nursing notes.  Pertinent labs & imaging results that were available during my care of the patient were reviewed by me and considered in my medical decision making (see chart for details).  Clinical Course    Patient presents with abdominal pain and distention accompanied by jaundice for the last 3 days. Patient is nontoxic appearing, afebrile, not tachycardic, not tachypneic, not hypotensive, maintains adequate SPO2 on room air, and is in no apparent distress. Patient has no signs of sepsis or other serious or life-threatening condition. The patient is recorded tachypnea is noted, however, patient was not tachypneic upon my multiple repeat evaluations. Patient does not show evidence of alcohol withdrawal at the present time. Patient tolerates oral hydration. Patient ambulates without distress, assistance, or significant change in SPO2. Patient was on prednisolone, which is likely the source of the patient's leukocytosis. All other labs are consistent with previous values. Patient requests a less expensive steroid than the prednisolone.   Findings and plan of care discussed with Raeford RazorStephen Kohut, MD. Dr. Juleen ChinaKohut personally evaluated and examined this patient.       Vitals:   07/25/16 2100 07/25/16 2130 07/25/16 2200 07/25/16 2230  BP: 130/83 125/86 130/89 142/75  Pulse: 81 84 82 89  Resp: 24 23 (!) 28 (!) 33  Temp:      TempSrc:      SpO2: 97% 96% 96% 95%  Weight:      Height:          Final Clinical Impressions(s) / ED Diagnoses   Final diagnoses:  Right upper quadrant abdominal pain    New  Prescriptions Discharge Medication List as of 07/25/2016 10:50 PM    START taking these medications   Details  predniSONE (DELTASONE) 20 MG tablet Take 2 tablets (40 mg total) by mouth daily with breakfast., Starting Sat 07/25/2016, Until Sat 08/15/2016, Print         Anselm PancoastShawn C Jadian Karman, PA-C 07/26/16 1813    Raeford RazorStephen Kohut, MD 07/27/16 [phone]

## 2016-07-25 NOTE — ED Notes (Signed)
Attempted x 1 with 22g to left forearm without success.  CN in to see about access

## 2016-07-25 NOTE — Discharge Instructions (Signed)
Your labs today showed either improvements or consistency with previous values. Discontinue taking the prednisolone. Begin taking the 40 mg of prednisone daily for the next 3 weeks. Follow up with the gastroenterologist as soon as possible. Return to the ED should symptoms worsen.

## 2016-07-27 ENCOUNTER — Telehealth: Payer: Self-pay

## 2016-07-27 NOTE — Telephone Encounter (Signed)
May resume lasix 20 mg and aldactone 50 mg each morning. I reviewed his last BUN/Cr. It has returned to normal. Need to recheck BMP at office visit on the 7th. If ANY worsening shortness of breath, let me know.   I note that he was seen in the ED over the weekend, and they changed his prednisolone to Prednisone. He really needs to stay on prednisolone as this is better metabolized and preferred for ETOH hepatitis, not prednisone (prednisone has to be metabolized to prednisolone, which is done in the liver, and this can be  impaired during ETOH hepatitis.

## 2016-07-27 NOTE — Telephone Encounter (Signed)
Pt called- he said he has noticed a lot of swelling in feet, legs and ankles and in his abd. Some SOB. He has noticed it for at least a week or so. He is not taking any fluid pills at this time. He doesn't want a para unless he has too. He wants to know if he can take lasix and spironolactone for a few days to see if the fluid will go down.  Please advise.

## 2016-07-28 MED ORDER — PREDNISOLONE 15 MG/5ML PO SOLN: 40.0000 mg | Freq: Every day | ORAL | 0 refills | Status: DC

## 2016-07-28 NOTE — Telephone Encounter (Signed)
Patient's [facility] [place] [place] [address]

## 2016-07-28 NOTE — Telephone Encounter (Signed)
Patient's sister made aware of Anna's recommendations.  She stated he needs the Rx for Prednisolone sent to the pharmacy.  She prefers the liquid because it's affordable-patient is uninsured

## 2016-07-28 NOTE — Telephone Encounter (Signed)
I printed prescription. Please fax to pharmacy.

## 2016-07-28 NOTE — Telephone Encounter (Signed)
Unable to get fax number for that particular store. I have called this rx in and left it on their voicemail at the pharmacy.

## 2016-07-28 NOTE — Telephone Encounter (Signed)
Tried to call pt- NA- LMOM with instructions.  

## 2016-07-31 ENCOUNTER — Telehealth: Payer: Self-pay

## 2016-07-31 NOTE — Telephone Encounter (Signed)
Spoke with sister. Unknown if fever but he does have chills. Noting shortness of breath, difficult to take in deep breath. Abdomen distended. Told her I was concerned for worsening ascites, infection, etc. Unclear why he is having shoulder pain. As of note, small left-sided pleural effusion noted on recent CXR. Discussed this with her. Advised to go to ED. She stated she would talk with him but understood importance of seeking medical attention.

## 2016-07-31 NOTE — Telephone Encounter (Signed)
Pt's sister Gaye Pollack([name] Holley) called [phone]. Pt's BP is 144/100, pulse-120. Swelling in abd, face, ankles, feet, up to knees. She has put compression socks on him. Shortness of breath, having chills (hasn't checked temp). Having left shoulder pain (stated really bad). Took to ER last Saturday night for abd pain, short winded, and swelling. He is on Prednisolone now. Advised pt's sister I would forward message to Ab. Please call pt's sister back.

## 2016-08-01 ENCOUNTER — Encounter (HOSPITAL_COMMUNITY): Payer: Self-pay | Admitting: Emergency Medicine

## 2016-08-01 ENCOUNTER — Emergency Department (HOSPITAL_COMMUNITY): Payer: Medicaid - Out of State

## 2016-08-01 ENCOUNTER — Emergency Department (HOSPITAL_COMMUNITY)
Admission: EM | Admit: 2016-08-01 | Discharge: 2016-08-01 | Disposition: A | Discharge status: Home / Self Care | Payer: Medicaid - Out of State | Attending: Emergency Medicine | Admitting: Emergency Medicine

## 2016-08-01 DIAGNOSIS — R609 Edema, unspecified: Secondary | ICD-10-CM

## 2016-08-01 DIAGNOSIS — H1589 Other disorders of sclera: Secondary | ICD-10-CM | POA: Insufficient documentation

## 2016-08-01 DIAGNOSIS — R600 Localized edema: Secondary | ICD-10-CM

## 2016-08-01 DIAGNOSIS — Z79899 Other long term (current) drug therapy: Secondary | ICD-10-CM | POA: Insufficient documentation

## 2016-08-01 DIAGNOSIS — M25512 Pain in left shoulder: Secondary | ICD-10-CM | POA: Insufficient documentation

## 2016-08-01 DIAGNOSIS — F17210 Nicotine dependence, cigarettes, uncomplicated: Secondary | ICD-10-CM | POA: Insufficient documentation

## 2016-08-01 LAB — COMPREHENSIVE METABOLIC PANEL
ALBUMIN: 2.7 g/dL — AB (ref 3.5–5.0)
ALK PHOS: 148 U/L — AB (ref 38–126)
ALT: 71 U/L — AB (ref 17–63)
ANION GAP: 8 (ref 5–15)
AST: 87 U/L — AB (ref 15–41)
BUN: 18 mg/dL (ref 6–20)
CALCIUM: 8.4 mg/dL — AB (ref 8.9–10.3)
CO2: 22 mmol/L (ref 22–32)
Chloride: 99 mmol/L — ABNORMAL LOW (ref 101–111)
Creatinine, Ser: 0.87 mg/dL (ref 0.61–1.24)
GFR calc Af Amer: >60 mL/min (ref >60)
GFR calc non Af Amer: >60 mL/min (ref >60)
GLUCOSE: 131 mg/dL — AB (ref 65–99)
Potassium: 3.6 mmol/L (ref 3.5–5.1)
SODIUM: 129 mmol/L — AB (ref 135–145)
Total Bilirubin: 6.6 mg/dL — ABNORMAL HIGH (ref 0.3–1.2)
Total Protein: 6.9 g/dL (ref 6.5–8.1)

## 2016-08-01 LAB — CBC WITH DIFFERENTIAL/PLATELET
BASOS ABS: 0 10*3/uL (ref 0.0–0.1)
BASOS PCT: 0 %
EOS ABS: 0 10*3/uL (ref 0.0–0.7)
Eosinophils Relative: 0 %
HEMATOCRIT: 35.1 % — AB (ref 39.0–52.0)
HEMOGLOBIN: 12.4 g/dL — AB (ref 13.0–17.0)
Lymphocytes Relative: 5 %
Lymphs Abs: 0.9 10*3/uL (ref 0.7–4.0)
MCH: 34.7 pg — ABNORMAL HIGH (ref 26.0–34.0)
MCHC: 35.3 g/dL (ref 30.0–36.0)
MCV: 98.3 fL (ref 78.0–100.0)
Monocytes Absolute: 0.5 10*3/uL (ref 0.1–1.0)
Monocytes Relative: 3 %
NEUTROS ABS: 17.3 10*3/uL — AB (ref 1.7–7.7)
NEUTROS PCT: 92 %
Platelets: 84 10*3/uL — ABNORMAL LOW (ref 150–400)
RBC: 3.57 MIL/uL — AB (ref 4.22–5.81)
RDW: 15.9 % — ABNORMAL HIGH (ref 11.5–15.5)
WBC: 18.7 10*3/uL — AB (ref 4.0–10.5)

## 2016-08-01 MED ORDER — SPIRONOLACTONE 100 MG PO TABS
100.0000 mg | ORAL_TABLET | Freq: Once | ORAL | Status: AC
  Administered 2016-08-01: 100 mg via ORAL
  Filled 2016-08-01: qty 1

## 2016-08-01 MED ORDER — TRAMADOL HCL 50 MG PO TABS: 50.0000 mg | ORAL_TABLET | Freq: Four times a day (QID) | ORAL | 0 refills | Status: DC | PRN

## 2016-08-01 MED ORDER — FUROSEMIDE 40 MG PO TABS
40.0000 mg | ORAL_TABLET | Freq: Once | ORAL | Status: AC
  Administered 2016-08-01: 40 mg via ORAL
  Filled 2016-08-01: qty 1

## 2016-08-01 MED ORDER — SPIRONOLACTONE 25 MG PO TABS
ORAL_TABLET | ORAL | Status: AC
  Filled 2016-08-01: qty 4

## 2016-08-01 MED ORDER — TRAMADOL HCL 50 MG PO TABS
50.0000 mg | ORAL_TABLET | Freq: Once | ORAL | Status: AC
  Administered 2016-08-01: 50 mg via ORAL
  Filled 2016-08-01: qty 1

## 2016-08-01 NOTE — ED Triage Notes (Signed)
Pt reports LE edema, swollen from feet to knees. Pt was seen here 1 week ago. Pt has had increasing pain in his L shoulder since being seen. Pt states he cannot lift his L arm, is having unrelenting pain not controlled with medication.

## 2016-08-01 NOTE — Discharge Instructions (Signed)
Increase the Lasix to 40 mg a day. Increase the Aldactone to 100 mg a day. Keep her legs elevated. Follow-up with their first GI doctor to stay as planned

## 2016-08-01 NOTE — ED Notes (Signed)
[facility]

## 2016-08-01 NOTE — ED Provider Notes (Signed)
[facility]-EM[facility] Provider Note   CSN: [id number] Arrival date & time: 08/01/16  1033  By signing my name below, I, Emmanuella Mensah, attest that this documentation has been prepared under the direction and in the presence of Bethann Berkshire, MD. Electronically Signed: Angelene Giovanni, ED Scribe. 08/01/16. 12:53 PM.   History   Chief Complaint Chief Complaint  Patient presents with  . Leg Swelling   HPI Comments: Joseph Cortez is a 50 y.o. male with a hx of hepatic cirrhosis, portal hypertensive gastropathy, alcoholic hepatitis with ascites, and AKI who presents to the Emergency Department complaining of gradually worsening severe bilateral lower extremities swelling with pain he describes as pressure onset one week ago. Family member reports that pt was admitted on 07/14/16 for acute alcoholic hepatitis with acute kidney injury where he was initially placed on prednisone but his GI physician requested that pt be placed on prednisolone instead. Pt was then evaluated in the ED on 07/25/16 for abdominal pain and discharged with prednisone. He notes that he is no longer having the abdominal pain. No alleviating factors noted. Pt explains that he began taking his Lasix and Aldactone at the beginning of this week per advise of his GI to help his current BLE swelling. He states that he did not receive any relief from those medications so he called his GI physician yesterday who then advised him to come to the ED. Pt denies any fever, chills, chest pain, or shortness of breath.  He states that he has also been experiencing gradually worsening moderate left shoulder pain onset 2-3 days ago. He reports associated pain with ROM of left arm. He denies any known falls, injuries, or trauma to the shoulder. He states that he has tried OTC topical pain creams with no relief. Pt has an allergy to Sertraline. No other complaints at this time.   The history is provided by the patient. No language interpreter  was used.  Shoulder Pain   This is a new problem. The current episode started more than 2 days ago. The problem has been gradually worsening. The pain is present in the left shoulder. The pain is moderate. Associated symptoms include limited range of motion. He has tried OTC ointments for the symptoms. The treatment provided no relief. There has been no history of extremity trauma.  Extremity Pain  This is a new problem. The current episode started more than 2 days ago. The problem has been gradually worsening. Pertinent negatives include no chest pain, no abdominal pain, no headaches and no shortness of breath. Associated symptoms comments: Extremity swelling. Nothing aggravates the symptoms. Nothing relieves the symptoms. Treatments tried: Lasix and Aldactone. The treatment provided no relief.    Past Medical History:  Diagnosis Date  . Anxiety   . Cirrhosis (HCC)   . Depression   . Neuromuscular disorder (HCC)    Neuropathy  . Neuropathy [facility])     Patient Active Problem List   Diagnosis Date Noted  . Alcoholic hepatitis with ascites 07/14/2016  . AKI (acute kidney injury) (HCC) 07/14/2016  . Hyponatremia 07/14/2016  . Hypokalemia 07/14/2016  . Alcohol abuse 07/14/2016  . Anxiety 07/14/2016  . Coagulopathy (HCC) 07/14/2016  . Thrombocytopenia (HCC) 07/14/2016  . Elevated LFTs   . Esophageal varices without bleeding (HCC)   . Portal hypertensive gastropathy   . Duodenal ulcer   . Reflux esophagitis   . Hepatic cirrhosis (HCC) 06/10/2015    Past Surgical History:  Procedure Laterality Date  . BIOPSY  06/27/2015  Procedure: BIOPSY (Gastric);  Surgeon: Corbin Adeobert M Rourk, MD;  Location: [facility] ORS;  Service: Endoscopy;;  . CHOLECYSTECTOMY    . ESOPHAGEAL BANDING N/A 01/30/2016   Procedure: ESOPHAGEAL BANDING;  Surgeon: Corbin Adeobert M Rourk, MD;  Location: [facility] ENDO SUITE;  Service: Endoscopy;  Laterality: N/A;  . ESOPHAGOGASTRODUODENOSCOPY  January 06, 2016   [facility]: duodenitis in  bullb, portal gastropathy, 2 columns of large distal esophageal varices, one with flat red spot, s/p band ligation. Needs 4 week surveillance EGD   . ESOPHAGOGASTRODUODENOSCOPY (EGD) WITH PROPOFOL N/A 06/27/2015   XLK:GMWNRMR:mild erosive reflux esophagitis, portal gastropathy, duodenal bulbar ulcer, negative H.pylori. Surveillance due yearly according to ASGE current guidelines regarding ETOH cirrhosis and ongoing ETOH abuse  . ESOPHAGOGASTRODUODENOSCOPY (EGD) WITH PROPOFOL N/A 01/30/2016   Dr. Jena Gaussourk: Grade 2 varices s/p banding with complete deflation of varices, portal gastropathy, surveillance in May 2018       Home Medications    Prior to Admission medications   Medication Sig Start Date End Date Taking? Authorizing Provider  folic acid (FOLVITE) 1 MG tablet Take 1 tablet (1 mg total) by mouth daily. Reported on 03/12/2016 03/13/16  Yes Gelene MinkAnna W Boone, NP  furosemide (LASIX) 20 MG tablet Take 1 tablet (20 mg total) by mouth daily. Patient taking differently: Take 20 mg by mouth once as needed for fluid.  03/12/16  Yes Gelene MinkAnna W Boone, NP  gabapentin (NEURONTIN) 300 MG capsule Take 300-600 mg by mouth 2 (two) times daily. 300mg  in the morning and 600mg  at bedtime   Yes Historical Provider, MD  lactulose (CHRONULAC) 10 GM/15ML solution Take 30 mLs (20 g total) by mouth 2 (two) times daily. Patient taking differently: Take 10 g by mouth 2 (two) times daily.  01/29/16  Yes Gelene MinkAnna W Boone, NP  loperamide (IMODIUM) 2 MG capsule Take 2 mg by mouth as needed for diarrhea or loose stools.   Yes Historical Provider, MD  Multiple Vitamin (MULTIVITAMIN WITH MINERALS) TABS tablet Take 1 tablet by mouth daily.   Yes Historical Provider, MD  pantoprazole (PROTONIX) 40 MG tablet Take 1 tablet (40 mg total) by mouth 2 (two) times daily before a meal. 01/29/16  Yes Gelene MinkAnna W Boone, NP  potassium chloride SA (K-DUR,KLOR-CON) 20 MEQ tablet Take 1 tablet (20 mEq total) by mouth daily. 07/16/16  Yes Erick BlinksJehanzeb Memon, MD  prednisoLONE  (ORAPRED ODT) 10 MG disintegrating tablet Take 40mg  po daily for 4 weeks then decrease by 10mg  every 4 days until complete 07/16/16  Yes Erick BlinksJehanzeb Memon, MD  prednisoLONE (PRELONE) 15 MG/5ML SOLN Take 13.3 mLs (40 mg total) by mouth daily before breakfast. For 2 weeks, then take 10 mls for 4 days, then 7 ml for 4 days, then 5 ml for 4 days then done. 07/28/16  Yes Gelene MinkAnna W Boone, NP  propranolol (INDERAL) 20 MG tablet Take 20 mg by mouth daily.   Yes Historical Provider, MD  spironolactone (ALDACTONE) 50 MG tablet Take 1 tablet (50 mg total) by mouth daily. 03/12/16  Yes Gelene MinkAnna W Boone, NP  thiamine 100 MG tablet Take 100 mg by mouth daily.  01/08/16  Yes Historical Provider, MD  predniSONE (DELTASONE) 20 MG tablet Take 2 tablets (40 mg total) by mouth daily with breakfast. 07/25/16 08/15/16  Anselm PancoastShawn C Joy, PA-C    Family History Family History  Problem Relation Age of Onset  . Colon cancer Maternal Uncle     Social History Social History  Substance Use Topics  . Smoking status: Current  Every Day Smoker    Packs/day: 0.50    Types: Cigarettes  . Smokeless tobacco: Never Used  . Alcohol use No     Comment: 1/5th bottle wine every day; stopped drinking October 18th      Allergies   Sertraline   Review of Systems Review of Systems  Constitutional: Negative for appetite change and fatigue.  HENT: Negative for congestion, ear discharge and sinus pressure.   Eyes: Negative for discharge.  Respiratory: Negative for cough and shortness of breath.   Cardiovascular: Positive for leg swelling. Negative for chest pain.  Gastrointestinal: Negative for abdominal pain and diarrhea.  Genitourinary: Negative for frequency and hematuria.  Musculoskeletal: Positive for arthralgias. Negative for back pain.  Skin: Negative for rash.  Neurological: Negative for seizures and headaches.  Psychiatric/Behavioral: Negative for hallucinations.     Physical Exam Updated Vital Signs BP 136/89 (BP Location:  Right Arm)   Pulse 70   Temp 97.6 F (36.4 C) (Tympanic)   Resp 20   Ht 5\' 8"  (1.727 m)   Wt 261 lb 6.4 oz (118.6 kg)   SpO2 98%   BMI 39.75 kg/m   Physical Exam  Constitutional: He is oriented to person, place, and time. He appears well-developed.  HENT:  Head: Normocephalic.  Dry mucous membrane  Eyes: Conjunctivae and EOM are normal. Scleral icterus is present.  Neck: Neck supple. No thyromegaly present.  Cardiovascular: Normal rate and regular rhythm.  Exam reveals no gallop and no friction rub.   No murmur heard. Pulmonary/Chest: No stridor. He has no wheezes. He has no rales. He exhibits no tenderness.  Abdominal: He exhibits distension. There is no tenderness. There is no rebound.  Musculoskeletal: He exhibits edema and tenderness.  3+ edema in left leg  Tenderness to the left shoulder  Lymphadenopathy:    He has no cervical adenopathy.  Neurological: He is oriented to person, place, and time. He exhibits normal muscle tone. Coordination normal.  Skin: No rash noted. No erythema.  Psychiatric: He has a normal mood and affect. His behavior is normal.     ED Treatments / Results  DIAGNOSTIC STUDIES: Oxygen Saturation is 98% on RA, normal by my interpretation.    COORDINATION OF CARE: 12:28 PM- Pt advised of plan for treatment and pt agrees. Pt will receive lab work, left shoulder x-ray, and EKG for further evaluation.   Labs (all labs ordered are listed, but only abnormal results are displayed) Labs Reviewed - No data to display  EKG  EKG Interpretation  Date/Time:  Saturday August 01 2016 11:00:40 EDT Ventricular Rate:  63 PR Interval:  138 QRS Duration: 82 QT Interval:  402 QTC Calculation: 411 R Axis:   -22 Text Interpretation:  Normal sinus rhythm Low voltage QRS Borderline ECG No significant change since last tracing Confirmed by Ethelda Chick  MD, SAM ([id number]) on 08/01/2016 11:07:02 AM       Radiology No results found.  Procedures Procedures  (including critical care time)  Medications Ordered in ED Medications - No data to display   Initial Impression / Assessment and Plan / ED Course  Bethann Berkshire, MD has reviewed the triage vital signs and the nursing notes.  Pertinent labs & imaging results that were available during my care of the patient were reviewed by me and considered in my medical decision making (see chart for details).  Clinical Course    Diagnosis liver failure with peripheral edema. I spoke with the GI doctor and it was recommended to  double up on his Lasix and Aldactone.  Final Clinical Impressions(s) / ED Diagnoses   Final diagnoses:  None    New Prescriptions New Prescriptions   No medications on file  The chart was scribed for me under my direct supervision.  I personally performed the history, physical, and medical decision making and all procedures in the evaluation of this patient.Bethann Berkshire.    Jerimiah Wolman, MD 08/01/16 [phone]

## 2016-08-02 ENCOUNTER — Telehealth: Payer: Self-pay | Admitting: Gastroenterology

## 2016-08-02 NOTE — Telephone Encounter (Signed)
Patient needs an ultrasound of abdomen with paracentesis on 11/6 (I'm not sure he will have enough fluid or not, but he has had worsening abdominal distension). 25 g Albumin IV at 4 liters. Please send fluid analysis as well . Also needs stat left lower extremity ultrasound to rule out DVT on 11/6. Please have them call me with results (give my pager number).   Keep appt for 11/7.

## 2016-08-03 ENCOUNTER — Encounter (HOSPITAL_COMMUNITY): Payer: Self-pay

## 2016-08-03 ENCOUNTER — Other Ambulatory Visit: Payer: Self-pay | Admitting: Gastroenterology

## 2016-08-03 ENCOUNTER — Ambulatory Visit (HOSPITAL_COMMUNITY)
Admission: RE | Admit: 2016-08-03 | Discharge: 2016-08-03 | Disposition: A | Discharge status: Home / Self Care | Payer: Medicaid - Out of State | Source: Ambulatory Visit | Attending: Gastroenterology | Admitting: Gastroenterology

## 2016-08-03 ENCOUNTER — Other Ambulatory Visit: Payer: Self-pay

## 2016-08-03 DIAGNOSIS — K7031 Alcoholic cirrhosis of liver with ascites: Secondary | ICD-10-CM

## 2016-08-03 LAB — BODY FLUID CELL COUNT WITH DIFFERENTIAL
EOS FL: 0 %
Lymphs, Fluid: 35 %
Monocyte-Macrophage-Serous Fluid: 9 % — ABNORMAL LOW (ref 50–90)
NEUTROPHIL FLUID: 56 % — AB (ref 0–25)
Total Nucleated Cell Count, Fluid: 176 cu mm (ref 0–1000)

## 2016-08-03 NOTE — Progress Notes (Signed)
Paracentesis complete no signs of distress. 4L yellow colored ascites removed.  

## 2016-08-03 NOTE — Telephone Encounter (Signed)
Pt is set up for US today at 11:30. His sister [name] is aware

## 2016-08-03 NOTE — Discharge Instructions (Signed)
Paracentesis °Paracentesis is a procedure to remove excess fluid (ascites) from the belly (abdomen). Ascites can result from certain conditions, such as infection, inflammation, abdominal injury, heart failure, chronic scarring of the liver (cirrhosis), or cancer. Ascites is removed using a needle that is inserted through the skin and tissue into the abdomen. °This procedure may be done: °· To determine the cause of the ascites. °· To relieve symptoms that are caused by the ascites, such as pain or shortness of breath. °· To see if there is bleeding after an abdominal injury. °LET YOUR HEALTH CARE PROVIDER KNOW ABOUT: °· Any allergies you have. °· All medicines you are taking, including vitamins, herbs, eye drops, creams, and over-the-counter medicines. °· Previous problems you or members of your family have had with the use of anesthetics. °· Any blood disorders you have. °· Previous surgeries you have had. °· Any medical conditions you have. °· Whether you are pregnant or may be pregnant. °RISKS AND COMPLICATIONS °Generally, this is a safe procedure. However, problems may occur, including: °· Infection. °· Bleeding. °· Injury to an abdominal organ, such as the bowel (large intestine), liver, spleen, or bladder. °· Low blood pressure (hypotension). °· Spreading of cancer, if there are cancer cells in the abdominal fluid. °· Mental status changes in people who have liver disease. These changes would be caused by shifts in the balance of fluids and minerals (electrolytes) in the body. °BEFORE THE PROCEDURE °· Ask your health care provider about: °¨ Changing or stopping your regular medicines. This is especially important if you are taking diabetes medicines or blood thinners. °¨ Taking medicines such as aspirin and ibuprofen. These medicines can thin your blood. Do not take these medicines before your procedure if your health care provider instructs you not to. °· A blood sample may be done to determine your blood  clotting time. °· You will be asked to urinate. °PROCEDURE °· You may be asked to lie on your back with your head raised (elevated). °· To reduce your risk of infection: °¨ Your health care team will wash or sanitize their hands. °¨ Your skin will be washed with soap. °· You will be given a medicine to numb the area (local anesthetic). °· Your abdominal skin will be punctured with a needle or a scalpel. °· A drainage tube will be inserted through the puncture site. Fluid will drain through the tube into a container. °· After enough fluid has been removed, the tube will be removed. °· A sample of the fluid will be sent for examination. °· A bandage (dressing) will be placed over the puncture site. °The procedure may vary among health care providers and hospitals. °AFTER THE PROCEDURE °· It is your responsibility to get your test results. Ask your health care provider or the department performing the test when your results will be ready. °  °This information is not intended to replace advice given to you by your health care provider. Make sure you discuss any questions you have with your health care provider. °  °Document Released: 03/30/2005 Document Revised: 06/05/2015 Document Reviewed: 11/27/2014 °Elsevier Interactive Patient Education ©2016 [employer] ° °

## 2016-08-04 ENCOUNTER — Encounter: Payer: Self-pay | Admitting: Gastroenterology

## 2016-08-04 ENCOUNTER — Ambulatory Visit (INDEPENDENT_AMBULATORY_CARE_PROVIDER_SITE_OTHER): Payer: Medicaid - Out of State | Admitting: Gastroenterology

## 2016-08-04 ENCOUNTER — Telehealth: Payer: Self-pay | Admitting: Internal Medicine

## 2016-08-04 ENCOUNTER — Other Ambulatory Visit: Payer: Self-pay

## 2016-08-04 VITALS — BP 121/76 | HR 69 | Temp 97.4°F | Ht 68.0 in | Wt 249.0 lb

## 2016-08-04 DIAGNOSIS — K7011 Alcoholic hepatitis with ascites: Secondary | ICD-10-CM

## 2016-08-04 DIAGNOSIS — L03116 Cellulitis of left lower limb: Secondary | ICD-10-CM | POA: Diagnosis not present

## 2016-08-04 DIAGNOSIS — K7031 Alcoholic cirrhosis of liver with ascites: Secondary | ICD-10-CM

## 2016-08-04 LAB — PATHOLOGIST SMEAR REVIEW

## 2016-08-04 LAB — GRAM STAIN

## 2016-08-04 MED ORDER — CEPHALEXIN 500 MG PO CAPS: 500.0000 mg | ORAL_CAPSULE | Freq: Four times a day (QID) | ORAL | 0 refills | Status: DC

## 2016-08-04 NOTE — Progress Notes (Signed)
Referring Provider: Kirstie PeriShah, Ashish, MD Primary Care Physician:  Kirstie PeriSHAH,ASHISH, MD  Chief Complaint  Patient presents with  . Follow-up    had test done yesterday, left shoulder pain, bil LE edema, left lower leg red    HPI:   Joseph Cortez is a 50 y.o. male presenting today with a history of ETOH cirrhosis, recently hospitalized for ETOH hepatitis. Was on prednisolone but this was changed to prednisone by the ED, then our office changed back to prednisolone. Received notification from his sister, Unknown FoleyCathy Holley, that his abdomen was swollen, distended, left shoulder pain, and he was advised to go to the ED. Shoulder xray negative. Due to abdominal distension, first time paracentesis completed with 4 liters removed. Negative fluid analysis. Due to left lower extremity edema reported by sister, duplex ultrasound completed and negative for DVT.   Presents today with recurrent abdominal ascites. Legs with significant edema. Taking lasix 40 mg BID and aldactone 100 mg BID. Feels like he needs another paracentesis. Left lower leg with redness. Some difficulty walking due to edema. Continues to urinate well. No confusion. Has tapered down on prednisolone on his own, only taking 7ml daily. Feels like dyspnea is worsening due to ascites present. No alcohol since Oct 18th. Following low sodium diet.   Past Medical History:  Diagnosis Date  . Anxiety   . Cirrhosis (HCC)   . Depression   . Neuromuscular disorder (HCC)    Neuropathy  . Neuropathy [facility](HCC)     Past Surgical History:  Procedure Laterality Date  . BIOPSY  06/27/2015   Procedure: BIOPSY (Gastric);  Surgeon: Corbin Adeobert M Rourk, MD;  Location: [facility] ORS;  Service: Endoscopy;;  . CHOLECYSTECTOMY    . ESOPHAGEAL BANDING N/A 01/30/2016   Procedure: ESOPHAGEAL BANDING;  Surgeon: Corbin Adeobert M Rourk, MD;  Location: [facility] END[facility] SU[facility];  Service: Endoscopy;  Laterality: N/A;  . ESOPHAGOGASTRODUODENOSCOPY  January 06, 2016   [facility]: duodenitis in bullb,  portal gastropathy, 2 columns of large distal esophageal varices, one with flat red spot, s/p band ligation. Needs 4 week surveillance EGD   . ESOPHAGOGASTRODUODENOSCOPY (EGD) WITH PROPOFOL N/A 06/27/2015   ZOX:WRUERMR:mild erosive reflux esophagitis, portal gastropathy, duodenal bulbar ulcer, negative H.pylori. Surveillance due yearly according to ASGE current guidelines regarding ETOH cirrhosis and ongoing ETOH abuse  . ESOPHAGOGASTRODUODENOSCOPY (EGD) WITH PROPOFOL N/A 01/30/2016   Dr. Jena Gaussourk: Grade 2 varices s/p banding with complete deflation of varices, portal gastropathy, surveillance in May 2018    Current Outpatient Prescriptions  Medication Sig Dispense Refill  . folic acid (FOLVITE) 1 MG tablet Take 1 tablet (1 mg total) by mouth daily. Reported on 03/12/2016 30 tablet 5  . furosemide (LASIX) 20 MG tablet Take 1 tablet (20 mg total) by mouth daily. (Patient taking differently: Take 40 mg by mouth 2 (two) times daily. ) 90 tablet 3  . gabapentin (NEURONTIN) 300 MG capsule Take 300-600 mg by mouth 2 (two) times daily. 300mg  in the morning, 300mg  at 4pm and 600mg  at bedtime    . lactulose (CHRONULAC) 10 GM/15ML solution Take 30 mLs (20 g total) by mouth 2 (two) times daily. (Patient taking differently: Take 10 g by mouth as needed. ) 1892 mL 5  . Multiple Vitamin (MULTIVITAMIN WITH MINERALS) TABS tablet Take 1 tablet by mouth daily.    . pantoprazole (PROTONIX) 40 MG tablet Take 1 tablet (40 mg total) by mouth 2 (two) times daily before a meal. 60 tablet 3  . potassium chloride SA (K-DUR,KLOR-CON)  20 MEQ tablet Take 1 tablet (20 mEq total) by mouth daily. 30 tablet 0  . prednisoLONE (PRELONE) 15 MG/5ML SOLN Take 13.3 mLs (40 mg total) by mouth daily before breakfast. For 2 weeks, then take 10 mls for 4 days, then 7 ml for 4 days, then 5 ml for 4 days then done. 300 mL 0  . spironolactone (ALDACTONE) 50 MG tablet Take 1 tablet (50 mg total) by mouth daily. (Patient taking differently: Take 100 mg by mouth  2 (two) times daily. ) 90 tablet 3  . thiamine 100 MG tablet Take 100 mg by mouth daily.     . cephALEXin (KEFLEX) 500 MG capsule Take 1 capsule (500 mg total) by mouth 4 (four) times daily. For 10 days 40 capsule 0   No current facility-administered medications for this visit.     Allergies as of 08/04/2016 - Review Complete 08/04/2016  Allergen Reaction Noted  . Sertraline Other (See Comments) 01/07/2016    Family History  Problem Relation Age of Onset  . Colon cancer Maternal Uncle     Social History   Social History  . Marital status: Divorced    Spouse name: N/A  . Number of children: N/A  . Years of education: N/A   Social History Main Topics  . Smoking status: Current Every Day Smoker    Packs/day: 0.50    Types: Cigarettes  . Smokeless tobacco: Never Used  . Alcohol use No     Comment: 1/5th bottle wine every day; stopped drinking October 18th   . Drug use: No     Comment: cocaine historically, several years ago  . Sexual activity: Not Asked   Other Topics Concern  . None   Social History Narrative  . None    Review of Systems: As mentioned in HPI   Physical Exam: BP 121/76   Pulse 69   Temp 97.4 F (36.3 C) (Oral)   Ht 5\' 8"  (1.727 m)   Wt 249 lb (112.9 kg)   BMI 37.86 kg/m  General:   Alert and oriented. No distress noted. Sallow-appearing  Head:  Normocephalic and atraumatic. Eyes:  Mild scleral icterus  Abdomen:  +BS, distended with moderately tense ascites. Weeping from site of prior LVAP.  Msk:  Symmetrical without gross deformities. Normal posture. Pulses:  2+ DP noted bilaterally Extremities:  Left lower extremity with erythema, edema, warm to touch anteriorly, also extending around to back of calf. Previous markings with pen noted but not extending beyond the border that was marked. 3+ pitting lower extremity edema bilateral lower extremities.  Neurologic:  Alert and  oriented x4;  grossly normal neurologically. Skin:  Intact without  significant lesions or rashes. Cervical Nodes:  No significant cervical adenopathy. Psych:  Alert and cooperative. Normal mood and affect.  Lab Results  Component Value Date   WBC 18.7 (H) 08/01/2016   HGB 12.4 (L) 08/01/2016   HCT 35.1 (L) 08/01/2016   MCV 98.3 08/01/2016   PLT 84 (L) 08/01/2016   Lab Results  Component Value Date   ALT 71 (H) 08/01/2016   AST 87 (H) 08/01/2016   ALKPHOS 148 (H) 08/01/2016   BILITOT 6.6 (H) 08/01/2016   Lab Results  Component Value Date   CREATININE 0.87 08/01/2016   BUN 18 08/01/2016   NA 129 (L) 08/01/2016   K 3.6 08/01/2016   CL 99 (L) 08/01/2016   CO2 22 08/01/2016    Lab Results  Component Value Date   INR 1.38  07/25/2016   INR 1.66 07/14/2016   INR 1.33 03/12/2016

## 2016-08-04 NOTE — Patient Instructions (Addendum)
For prednisolone: Take 5 ml tomorrow, 5 ml Thursday, 2.5 ml Friday and Saturday, then stop.   Start taking Keflex four times a day. I have given you enough for 10 days, but if you are remarkably better/resolved at 7 days, we will stop therapy then.   We have ordered a paracentesis for Friday along with blood work. Please take the blood work orders with you.   I would like to see you in 1 week to make sure things are improving.

## 2016-08-04 NOTE — Telephone Encounter (Signed)
[facility] [place]

## 2016-08-04 NOTE — Telephone Encounter (Signed)
Patient and his sister just left the office. Sister called back to say to [facility]

## 2016-08-04 NOTE — Progress Notes (Signed)
I have fax[facility] a referral to [facility] and they will get back with us to see if they can take him.

## 2016-08-05 NOTE — Assessment & Plan Note (Addendum)
50 year old male with recent bout with ETOH hepatitis, history of ETOH cirrhosis, no alcohol in almost a month. I have noted a steady decline in patient since admission for ETOH hepatitis. LFTs have improved, but he required his first LVAP yesterday and will need another one by the end of week. Negative for SBP. Significant lower extremity edema/anasarca noted, on Lasix 40 mg BID and Aldactone 100 mg BID. I'm hesitant to change this right now as he has hyponatremia (Na 129), but his renal function remains stable. Albumin 2.7. Discussed at length his rapid decompensation since recent admission, the fact that liver candidacy is not an option right now until at least 6 months of sobriety, and the overall progression of disease with cirrhosis. I spent at length discussing a living will, health care power of attorney, and asking for hospice to come and visit to see if he would be a candidate or perhaps assistance with medical equipment such as hospital bed and wheelchair.   Repeat LVAP on Friday, with Albumin 25 g IV at start and 25 g IV at 4 liters. Recheck CMP, INR on Friday to calculate MELD and assess renal status. Continue current diuretic dosing for now. Discussed candidly about appointing health care power of attorney. Return in 1 week for close follow-up. Rapid taper of prednisolone discussed due to now concerns for cellulitis.   Addendum: hospice has seen patient and stated he is an appropriate candidate; however, they will not be able to enroll him unless he were to cease paracenteses, blood work, etc. At this point, I feel he should continue this for therapeutic purposes and is better served with "transition" hospice, whereby they will see him each month to reassess for any needs.

## 2016-08-05 NOTE — Assessment & Plan Note (Signed)
Left lower extremity appears consistent with cellulitis. Start Keflex, treatment prescribed for 10 days but may be able to discontinue after week of therapy if marked improvement. No fever, chills. Return in 1 week.

## 2016-08-07 ENCOUNTER — Other Ambulatory Visit (HOSPITAL_COMMUNITY)
Admission: RE | Admit: 2016-08-07 | Discharge: 2016-08-07 | Disposition: A | Discharge status: Home / Self Care | Payer: Self-pay | Attending: Gastroenterology | Admitting: Gastroenterology

## 2016-08-07 ENCOUNTER — Other Ambulatory Visit: Payer: Self-pay | Admitting: Gastroenterology

## 2016-08-07 ENCOUNTER — Encounter (HOSPITAL_COMMUNITY): Payer: Self-pay

## 2016-08-07 ENCOUNTER — Ambulatory Visit (HOSPITAL_COMMUNITY)
Admission: RE | Admit: 2016-08-07 | Discharge: 2016-08-07 | Disposition: A | Discharge status: Home / Self Care | Payer: Self-pay | Source: Ambulatory Visit | Attending: Gastroenterology | Admitting: Gastroenterology

## 2016-08-07 DIAGNOSIS — K7031 Alcoholic cirrhosis of liver with ascites: Secondary | ICD-10-CM | POA: Insufficient documentation

## 2016-08-07 LAB — COMPREHENSIVE METABOLIC PANEL
ALT: 67 U/L — AB (ref 17–63)
AST: 100 U/L — AB (ref 15–41)
Albumin: 2.7 g/dL — ABNORMAL LOW (ref 3.5–5.0)
Alkaline Phosphatase: 157 U/L — ABNORMAL HIGH (ref 38–126)
Anion gap: 8 (ref 5–15)
BUN: 12 mg/dL (ref 6–20)
CHLORIDE: 99 mmol/L — AB (ref 101–111)
CO2: 25 mmol/L (ref 22–32)
CREATININE: 0.91 mg/dL (ref 0.61–1.24)
Calcium: 8.4 mg/dL — ABNORMAL LOW (ref 8.9–10.3)
GFR calc non Af Amer: >60 mL/min (ref >60)
Glucose, Bld: 110 mg/dL — ABNORMAL HIGH (ref 65–99)
Potassium: 3.3 mmol/L — ABNORMAL LOW (ref 3.5–5.1)
SODIUM: 132 mmol/L — AB (ref 135–145)
Total Bilirubin: 5.2 mg/dL — ABNORMAL HIGH (ref 0.3–1.2)
Total Protein: 6.6 g/dL (ref 6.5–8.1)

## 2016-08-07 LAB — PROTIME-INR
INR: 1.51
Prothrombin Time: 18.3 seconds — ABNORMAL HIGH (ref 11.4–15.2)

## 2016-08-07 MED ORDER — ALBUMIN HUMAN 25 % IV SOLN
25.0000 g | Freq: Once | INTRAVENOUS | Status: AC
  Administered 2016-08-07: 25 g via INTRAVENOUS

## 2016-08-07 MED ORDER — POTASSIUM CHLORIDE CRYS ER 20 MEQ PO TBCR: 20.0000 meq | EXTENDED_RELEASE_TABLET | Freq: Two times a day (BID) | ORAL | 3 refills | Status: DC

## 2016-08-07 MED ORDER — ALBUMIN HUMAN 25 % IV SOLN
INTRAVENOUS | Status: AC
  Administered 2016-08-07: 25 g via INTRAVENOUS
  Filled 2016-08-07: qty 100

## 2016-08-07 NOTE — Progress Notes (Signed)
Paracentesis complete no signs of distress.  

## 2016-08-07 NOTE — Procedures (Signed)
INDICATION: [Cirrhosis.]  EXAM: ULTRASOUND GUIDED PARACENTESIS  GRIP-IR: Category: Fluids    Subcategory: [Paracentesis]    Follow-Up: [None]    MEDICATIONS: [None.]  COMPLICATIONS: [None immediate.]  PROCEDURE: Informed written consent was obtained from the patient after a discussion of the risks, benefits and alternatives to treatment. A timeout was performed prior to the initiation of the procedure.    Initial ultrasound scanning demonstrates a [large] amount of ascites within the right lower abdominal quadrant. The right lower abdomen was prepped and draped in the usual sterile fashion. 1% lidocaine with epinephrine was used for local anesthesia.     Following this, a [19 gauge, 7-cm, Yueh] catheter was introduced. An ultrasound image was saved for documentation purposes. The paracentesis was performed. The catheter was removed and a dressing was applied. The patient tolerated the procedure well without immediate post procedural complication.   FINDINGS: A total of approximately [4300] of [serous] fluid was removed.  IMPRESSION:  Successful ultrasound-guided paracentesis yielding [4.3 ] liters of peritoneal fluid.

## 2016-08-08 LAB — CULTURE, BODY FLUID W GRAM STAIN -BOTTLE

## 2016-08-08 LAB — CULTURE, BODY FLUID-BOTTLE: CULTURE: NO GROWTH

## 2016-08-09 ENCOUNTER — Encounter (HOSPITAL_COMMUNITY): Payer: Self-pay | Admitting: *Deleted

## 2016-08-09 ENCOUNTER — Inpatient Hospital Stay (HOSPITAL_COMMUNITY)
Admission: EM | Admit: 2016-08-09 | Discharge: 2016-08-15 | DRG: 641 | Disposition: A | Discharge status: Home / Self Care | Payer: Medicaid - Out of State | Attending: Internal Medicine | Admitting: Internal Medicine

## 2016-08-09 DIAGNOSIS — K2100 Gastro-esophageal reflux disease with esophagitis, without bleeding: Secondary | ICD-10-CM | POA: Diagnosis present

## 2016-08-09 DIAGNOSIS — Z79899 Other long term (current) drug therapy: Secondary | ICD-10-CM

## 2016-08-09 DIAGNOSIS — L0390 Cellulitis, unspecified: Secondary | ICD-10-CM | POA: Diagnosis present

## 2016-08-09 DIAGNOSIS — K7030 Alcoholic cirrhosis of liver without ascites: Secondary | ICD-10-CM | POA: Diagnosis present

## 2016-08-09 DIAGNOSIS — E871 Hypo-osmolality and hyponatremia: Secondary | ICD-10-CM | POA: Diagnosis present

## 2016-08-09 DIAGNOSIS — K7460 Unspecified cirrhosis of liver: Secondary | ICD-10-CM | POA: Diagnosis present

## 2016-08-09 DIAGNOSIS — G629 Polyneuropathy, unspecified: Secondary | ICD-10-CM | POA: Diagnosis present

## 2016-08-09 DIAGNOSIS — F17210 Nicotine dependence, cigarettes, uncomplicated: Secondary | ICD-10-CM | POA: Diagnosis present

## 2016-08-09 DIAGNOSIS — E8770 Fluid overload, unspecified: Principal | ICD-10-CM | POA: Diagnosis present

## 2016-08-09 DIAGNOSIS — L03116 Cellulitis of left lower limb: Secondary | ICD-10-CM

## 2016-08-09 DIAGNOSIS — F419 Anxiety disorder, unspecified: Secondary | ICD-10-CM | POA: Diagnosis present

## 2016-08-09 DIAGNOSIS — Z9049 Acquired absence of other specified parts of digestive tract: Secondary | ICD-10-CM

## 2016-08-09 DIAGNOSIS — Z9119 Patient's noncompliance with other medical treatment and regimen: Secondary | ICD-10-CM

## 2016-08-09 DIAGNOSIS — Z888 Allergy status to other drugs, medicaments and biological substances status: Secondary | ICD-10-CM

## 2016-08-09 DIAGNOSIS — E669 Obesity, unspecified: Secondary | ICD-10-CM | POA: Diagnosis present

## 2016-08-09 DIAGNOSIS — Z6836 Body mass index (BMI) 36.0-36.9, adult: Secondary | ICD-10-CM

## 2016-08-09 DIAGNOSIS — M7989 Other specified soft tissue disorders: Secondary | ICD-10-CM | POA: Diagnosis present

## 2016-08-09 DIAGNOSIS — R609 Edema, unspecified: Secondary | ICD-10-CM

## 2016-08-09 LAB — CBC WITH DIFFERENTIAL/PLATELET
Basophils Absolute: 0.1 10*3/uL (ref 0.0–0.1)
Basophils Relative: 0 %
Eosinophils Absolute: 0.2 10*3/uL (ref 0.0–0.7)
Eosinophils Relative: 2 %
HEMATOCRIT: 39.3 % (ref 39.0–52.0)
HEMOGLOBIN: 13.5 g/dL (ref 13.0–17.0)
LYMPHS ABS: 2.7 10*3/uL (ref 0.7–4.0)
LYMPHS PCT: 18 %
MCH: 34.8 pg — AB (ref 26.0–34.0)
MCHC: 34.4 g/dL (ref 30.0–36.0)
MCV: 101.3 fL — AB (ref 78.0–100.0)
Monocytes Absolute: 0.7 10*3/uL (ref 0.1–1.0)
Monocytes Relative: 5 %
NEUTROS PCT: 75 %
Neutro Abs: 11.9 10*3/uL — ABNORMAL HIGH (ref 1.7–7.7)
Platelets: 139 10*3/uL — ABNORMAL LOW (ref 150–400)
RBC: 3.88 MIL/uL — AB (ref 4.22–5.81)
RDW: 14.8 % (ref 11.5–15.5)
WBC: 15.6 10*3/uL — AB (ref 4.0–10.5)

## 2016-08-09 LAB — COMPREHENSIVE METABOLIC PANEL
ALK PHOS: 197 U/L — AB (ref 38–126)
ALT: 67 U/L — AB (ref 17–63)
AST: 85 U/L — ABNORMAL HIGH (ref 15–41)
Albumin: 3.2 g/dL — ABNORMAL LOW (ref 3.5–5.0)
Anion gap: 11 (ref 5–15)
BILIRUBIN TOTAL: 7.1 mg/dL — AB (ref 0.3–1.2)
BUN: 9 mg/dL (ref 6–20)
CALCIUM: 9.2 mg/dL (ref 8.9–10.3)
CO2: 26 mmol/L (ref 22–32)
CREATININE: 0.98 mg/dL (ref 0.61–1.24)
Chloride: 95 mmol/L — ABNORMAL LOW (ref 101–111)
Glucose, Bld: 105 mg/dL — ABNORMAL HIGH (ref 65–99)
Potassium: 3.6 mmol/L (ref 3.5–5.1)
Sodium: 132 mmol/L — ABNORMAL LOW (ref 135–145)
TOTAL PROTEIN: 8.1 g/dL (ref 6.5–8.1)

## 2016-08-09 MED ORDER — FOLIC ACID 1 MG PO TABS
1.0000 mg | ORAL_TABLET | Freq: Every day | ORAL | Status: DC
  Administered 2016-08-10 – 2016-08-15 (×6): 1 mg via ORAL
  Filled 2016-08-09 (×6): qty 1

## 2016-08-09 MED ORDER — VITAMIN B-1 100 MG PO TABS
100.0000 mg | ORAL_TABLET | Freq: Every day | ORAL | Status: DC
  Administered 2016-08-10 – 2016-08-15 (×6): 100 mg via ORAL
  Filled 2016-08-09 (×6): qty 1

## 2016-08-09 MED ORDER — LACTULOSE 10 GM/15ML PO SOLN
10.0000 g | Freq: Two times a day (BID) | ORAL | Status: DC
  Administered 2016-08-10 – 2016-08-15 (×8): 10 g via ORAL
  Filled 2016-08-09 (×11): qty 30

## 2016-08-09 MED ORDER — CEFTRIAXONE SODIUM 2 G IJ SOLR
2.0000 g | INTRAMUSCULAR | Status: DC
  Administered 2016-08-09 – 2016-08-12 (×4): 2 g via INTRAVENOUS
  Filled 2016-08-09 (×5): qty 2

## 2016-08-09 MED ORDER — VANCOMYCIN HCL IN DEXTROSE 1-5 GM/200ML-% IV SOLN
1000.0000 mg | Freq: Three times a day (TID) | INTRAVENOUS | Status: DC
  Administered 2016-08-10 – 2016-08-13 (×10): 1000 mg via INTRAVENOUS
  Filled 2016-08-09 (×11): qty 200

## 2016-08-09 MED ORDER — SODIUM CHLORIDE 0.9 % IV BOLUS (SEPSIS)
1000.0000 mL | Freq: Once | INTRAVENOUS | Status: AC
  Administered 2016-08-09: 1000 mL via INTRAVENOUS

## 2016-08-09 MED ORDER — HYDROMORPHONE HCL 1 MG/ML IJ SOLN
1.0000 mg | Freq: Once | INTRAMUSCULAR | Status: AC
  Administered 2016-08-09: 1 mg via INTRAVENOUS
  Filled 2016-08-09: qty 1

## 2016-08-09 MED ORDER — PREDNISOLONE SODIUM PHOSPHATE 15 MG/5ML PO SOLN
30.0000 mg | Freq: Every day | ORAL | Status: AC
  Filled 2016-08-09 (×4): qty 10

## 2016-08-09 MED ORDER — PREDNISOLONE SODIUM PHOSPHATE 15 MG/5ML PO SOLN
40.0000 mg | Freq: Every day | ORAL | Status: AC
  Filled 2016-08-09: qty 15

## 2016-08-09 MED ORDER — SODIUM CHLORIDE 0.9% FLUSH
3.0000 mL | Freq: Two times a day (BID) | INTRAVENOUS | Status: DC
  Administered 2016-08-09 – 2016-08-15 (×12): 3 mL via INTRAVENOUS

## 2016-08-09 MED ORDER — ADULT MULTIVITAMIN W/MINERALS CH
1.0000 | ORAL_TABLET | Freq: Every day | ORAL | Status: DC
  Administered 2016-08-10 – 2016-08-15 (×6): 1 via ORAL
  Filled 2016-08-09 (×6): qty 1

## 2016-08-09 MED ORDER — HEPARIN SODIUM (PORCINE) 5000 UNIT/ML IJ SOLN
5000.0000 [IU] | Freq: Three times a day (TID) | INTRAMUSCULAR | Status: DC
  Administered 2016-08-09 – 2016-08-10 (×5): 5000 [IU] via SUBCUTANEOUS
  Filled 2016-08-09 (×11): qty 1

## 2016-08-09 MED ORDER — SPIRONOLACTONE 25 MG PO TABS
100.0000 mg | ORAL_TABLET | Freq: Two times a day (BID) | ORAL | Status: DC
  Administered 2016-08-10 – 2016-08-15 (×12): 100 mg via ORAL
  Filled 2016-08-09 (×11): qty 4

## 2016-08-09 MED ORDER — DEXTROSE 5 % IV SOLN
2.0000 g | Freq: Once | INTRAVENOUS | Status: DC
  Filled 2016-08-09: qty 2

## 2016-08-09 MED ORDER — PREDNISOLONE 15 MG/5ML PO SOLN: 40.0000 mg | Freq: Every day | ORAL | Status: DC

## 2016-08-09 MED ORDER — ONDANSETRON HCL 4 MG/2ML IJ SOLN
4.0000 mg | Freq: Once | INTRAMUSCULAR | Status: AC
  Administered 2016-08-09: 4 mg via INTRAVENOUS
  Filled 2016-08-09: qty 2

## 2016-08-09 MED ORDER — MORPHINE SULFATE (PF) 2 MG/ML IV SOLN
1.0000 mg | INTRAVENOUS | Status: DC | PRN
  Administered 2016-08-10 – 2016-08-11 (×5): 1 mg via INTRAVENOUS
  Filled 2016-08-09 (×5): qty 1

## 2016-08-09 MED ORDER — THIAMINE HCL 100 MG PO TABS: 100.0000 mg | ORAL_TABLET | Freq: Every day | ORAL | Status: DC

## 2016-08-09 MED ORDER — SODIUM CHLORIDE 0.9% FLUSH: 3.0000 mL | INTRAVENOUS | Status: DC | PRN

## 2016-08-09 MED ORDER — OXYCODONE HCL 5 MG PO TABS
5.0000 mg | ORAL_TABLET | ORAL | Status: DC | PRN
  Administered 2016-08-10 – 2016-08-15 (×16): 10 mg via ORAL
  Administered 2016-08-15 (×2): 5 mg via ORAL
  Filled 2016-08-09 (×2): qty 2
  Filled 2016-08-09: qty 1
  Filled 2016-08-09 (×13): qty 2
  Filled 2016-08-09: qty 1
  Filled 2016-08-09: qty 2

## 2016-08-09 MED ORDER — SODIUM CHLORIDE 0.9 % IV SOLN: 250.0000 mL | INTRAVENOUS | Status: DC | PRN

## 2016-08-09 MED ORDER — DEXTROSE 5 % IV SOLN
INTRAVENOUS | Status: AC
  Filled 2016-08-09: qty 2

## 2016-08-09 MED ORDER — PREDNISOLONE SODIUM PHOSPHATE 15 MG/5ML PO SOLN
21.0000 mg | Freq: Every day | ORAL | Status: DC
  Filled 2016-08-09 (×3): qty 10

## 2016-08-09 MED ORDER — GABAPENTIN 300 MG PO CAPS
600.0000 mg | ORAL_CAPSULE | Freq: Every day | ORAL | Status: DC
  Administered 2016-08-09 – 2016-08-14 (×7): 600 mg via ORAL
  Filled 2016-08-09 (×6): qty 2

## 2016-08-09 MED ORDER — VANCOMYCIN HCL IN DEXTROSE 1-5 GM/200ML-% IV SOLN
1000.0000 mg | Freq: Once | INTRAVENOUS | Status: AC
  Administered 2016-08-09: 1000 mg via INTRAVENOUS
  Filled 2016-08-09: qty 200

## 2016-08-09 MED ORDER — PANTOPRAZOLE SODIUM 40 MG PO TBEC
40.0000 mg | DELAYED_RELEASE_TABLET | Freq: Two times a day (BID) | ORAL | Status: DC
  Administered 2016-08-09 – 2016-08-15 (×12): 40 mg via ORAL
  Filled 2016-08-09 (×12): qty 1

## 2016-08-09 MED ORDER — PREDNISOLONE SODIUM PHOSPHATE 15 MG/5ML PO SOLN: 15.0000 mg | Freq: Every day | ORAL | Status: DC

## 2016-08-09 MED ORDER — LORAZEPAM 1 MG PO TABS
1.0000 mg | ORAL_TABLET | Freq: Once | ORAL | Status: AC
  Administered 2016-08-09: 1 mg via ORAL
  Filled 2016-08-09: qty 1

## 2016-08-09 MED ORDER — GABAPENTIN 300 MG PO CAPS
300.0000 mg | ORAL_CAPSULE | ORAL | Status: DC
  Administered 2016-08-10 – 2016-08-15 (×11): 300 mg via ORAL
  Filled 2016-08-09 (×11): qty 1

## 2016-08-09 MED ORDER — GABAPENTIN 300 MG PO CAPS: 300.0000 mg | ORAL_CAPSULE | Freq: Two times a day (BID) | ORAL | Status: DC

## 2016-08-09 MED ORDER — VANCOMYCIN HCL IN DEXTROSE 1-5 GM/200ML-% IV SOLN: 1000.0000 mg | Freq: Once | INTRAVENOUS | Status: DC

## 2016-08-09 NOTE — ED Notes (Signed)
Attempted to call report

## 2016-08-09 NOTE — Progress Notes (Signed)
Pharmacy Antibiotic Note  Cristopher EstimableMichael Leonardo is a 50 y.o. male admitted on 08/09/2016 with cellulitis.  Pharmacy has been consulted for vancomycin and rocephin dosing. Initial doses given in the ED  Plan: Cont rocephin 2 gm IV q24 hours Cont vanc 1 gm IV q8 hours F/u renal function, cultures and clinical course  Height: 5\' 8"  (172.7 cm) Weight: 237 lb 1.6 oz (107.5 kg) IBW/kg (Calculated) : 68.4  Temp (24hrs), Avg:97.8 F (36.6 C), Min:97.7 F (36.5 C), Max:97.8 F (36.6 C)   Recent Labs Lab 08/07/16 1035 08/09/16 1319  WBC  --  15.6*  CREATININE 0.91 0.98    Estimated Creatinine Clearance: 109.5 mL/min (by C-G formula based on SCr of 0.98 mg/dL).    Allergies  Allergen Reactions  . Sertraline Other (See Comments)    Suicidal ideations    Antimicrobials this admission: rocephin 11/12 >>  vanc 11/12 >>   Thank you for allowing pharmacy to be a part of this patient's care.  Talbert CageSeay, Cobi Delph Poteet 08/09/2016 6:18 PM

## 2016-08-09 NOTE — ED Provider Notes (Signed)
[facility] Provider Note   CSN: [id number] Arrival date & time: 08/09/16  1224  By signing my name below, I, [name], attest that this documentation has been prepared under the direction and in the presence of Marily Memos, MD  Electronically Signed: [name], ED Scribe. 08/09/16. 12:58 PM.   History   Chief Complaint Chief Complaint  Patient presents with  . Leg Pain   The history is provided by the patient. No language interpreter was used.   HPI Comments:  Joseph Cortez is a 50 y.o. male, with a hx of cirrhosis, who presents to the Emergency Department complaining of worsening bilateral lower extremity swelling and redness (left leg>right leg) which worsened 2 days ago. Pt states he initially had a small patch of redness on his left leg. Pt was started on oral keflex on 08/04/16 with no relief. He was checked for a possible blood clot x 7 days. Pt notes associated subjective fevers, chills and nausea. He also notes a "nagging" pain to his left shoulder x 1.5 weeks which is worse with certain movement. He denies vomiting, cough, chest pain, abdominal pain, back pain and any other complaints at this time. No known drug allergies  Past Medical History:  Diagnosis Date  . Anxiety   . Cirrhosis (HCC)   . Depression   . Neuromuscular disorder (HCC)    Neuropathy  . Neuropathy [facility])     Patient Active Problem List   Diagnosis Date Noted  . Cellulitis of leg, left 08/04/2016  . Alcoholic hepatitis with ascites 07/14/2016  . AKI (acute kidney injury) (HCC) 07/14/2016  . Hyponatremia 07/14/2016  . Hypokalemia 07/14/2016  . Alcohol abuse 07/14/2016  . Anxiety 07/14/2016  . Coagulopathy (HCC) 07/14/2016  . Thrombocytopenia (HCC) 07/14/2016  . Elevated LFTs   . Esophageal varices without bleeding (HCC)   . Portal hypertensive gastropathy   . Duodenal ulcer   . Reflux esophagitis   . Hepatic cirrhosis (HCC) 06/10/2015    Past Surgical History:  Procedure  Laterality Date  . BIOPSY  06/27/2015   Procedure: BIOPSY (Gastric);  Surgeon: Corbin Ade, MD;  Location: [facility] ORS;  Service: Endoscopy;;  . CHOLECYSTECTOMY    . ESOPHAGEAL BANDING N/A 01/30/2016   Procedure: ESOPHAGEAL BANDING;  Surgeon: Corbin Ade, MD;  Location: [facility] ENDO SUITE;  Service: Endoscopy;  Laterality: N/A;  . ESOPHAGOGASTRODUODENOSCOPY  January 06, 2016   Dr. Alycia Rossetti at [facility]: duodenitis in bullb, portal gastropathy, 2 columns of large distal esophageal varices, one with flat red spot, s/p band ligation. Needs 4 week surveillance EGD   . ESOPHAGOGASTRODUODENOSCOPY (EGD) WITH PROPOFOL N/A 06/27/2015   WUJ:WJXB erosive reflux esophagitis, portal gastropathy, duodenal bulbar ulcer, negative H.pylori. Surveillance due yearly according to ASGE current guidelines regarding ETOH cirrhosis and ongoing ETOH abuse  . ESOPHAGOGASTRODUODENOSCOPY (EGD) WITH PROPOFOL N/A 01/30/2016   Dr. Jena Gauss: Grade 2 varices s/p banding with complete deflation of varices, portal gastropathy, surveillance in May 2018       Home Medications    Prior to Admission medications   Medication Sig Start Date End Date Taking? Authorizing Provider  cephALEXin (KEFLEX) 500 MG capsule Take 1 capsule (500 mg total) by mouth 4 (four) times daily. For 10 days 08/04/16   Gelene Mink, NP  folic acid (FOLVITE) 1 MG tablet Take 1 tablet (1 mg total) by mouth daily. Reported on 03/12/2016 03/13/16   Gelene Mink, NP  furosemide (LASIX) 20 MG tablet Take 1 tablet (20 mg total) by  mouth daily. Patient taking differently: Take 40 mg by mouth 2 (two) times daily.  03/12/16   Gelene MinkAnna W Boone, NP  gabapentin (NEURONTIN) 300 MG capsule Take 300-600 mg by mouth 2 (two) times daily. 300mg  in the morning, 300mg  at 4pm and 600mg  at bedtime    Historical Provider, MD  lactulose (CHRONULAC) 10 GM/15ML solution Take 30 mLs (20 g total) by mouth 2 (two) times daily. Patient taking differently: Take 10 g by mouth as needed.  01/29/16   Gelene MinkAnna W Boone, NP    Multiple Vitamin (MULTIVITAMIN WITH MINERALS) TABS tablet Take 1 tablet by mouth daily.    Historical Provider, MD  pantoprazole (PROTONIX) 40 MG tablet Take 1 tablet (40 mg total) by mouth 2 (two) times daily before a meal. 01/29/16   Gelene MinkAnna W Boone, NP  potassium chloride SA (K-DUR,KLOR-CON) 20 MEQ tablet Take 1 tablet (20 mEq total) by mouth 2 (two) times daily. 08/07/16   Gelene MinkAnna W Boone, NP  prednisoLONE (PRELONE) 15 MG/5ML SOLN Take 13.3 mLs (40 mg total) by mouth daily before breakfast. For 2 weeks, then take 10 mls for 4 days, then 7 ml for 4 days, then 5 ml for 4 days then done. 07/28/16   Gelene MinkAnna W Boone, NP  spironolactone (ALDACTONE) 50 MG tablet Take 1 tablet (50 mg total) by mouth daily. Patient taking differently: Take 100 mg by mouth 2 (two) times daily.  03/12/16   Gelene MinkAnna W Boone, NP  thiamine 100 MG tablet Take 100 mg by mouth daily.  01/08/16   Historical Provider, MD    Family History Family History  Problem Relation Age of Onset  . Colon cancer Maternal Uncle     Social History Social History  Substance Use Topics  . Smoking status: Current Every Day Smoker    Types: Cigarettes  . Smokeless tobacco: Never Used     Comment: 3-4 cigarettes daily   . Alcohol use No     Comment: 1/5th bottle wine every day; stopped drinking October 18th      Allergies   Sertraline   Review of Systems Review of Systems  Constitutional: Positive for chills and fever (subjective).  Respiratory: Negative for cough.   Cardiovascular: Positive for leg swelling. Negative for chest pain.  Gastrointestinal: Positive for nausea. Negative for abdominal pain and vomiting.  Musculoskeletal: Positive for myalgias. Negative for back pain.  Skin: Positive for color change.  All other systems reviewed and are negative.    Physical Exam Updated Vital Signs BP 120/64 (BP Location: Left Arm)   Pulse 84   Temp 97.7 F (36.5 C) (Oral)   Resp 18   Ht 5\' 8"  (1.727 m)   Wt 239 lb (108.4 kg)   SpO2  100%   BMI 36.34 kg/m   Physical Exam  Constitutional: He is oriented to person, place, and time. He appears well-developed and well-nourished. No distress.  HENT:  Head: Normocephalic and atraumatic.  Eyes: Conjunctivae are normal.  Eyes yellow.   Cardiovascular: Normal rate, regular rhythm and normal heart sounds.   Pulmonary/Chest: Effort normal and breath sounds normal.  Abdominal: Soft. He exhibits distension. There is no tenderness.  Musculoskeletal: He exhibits edema and tenderness.  Neurological: He is alert and oriented to person, place, and time.  Skin: Skin is warm and dry. There is erythema.  Significant erythema to left lower extremity up to 3 cm below his knee down to toes circumferential. Has multple fluid filled blisters that are weeping cirrus fluid. R leg  also has edema and 5-6 erythematous areas L leg swelling and edema > right leg. Tenderness to touch and minimal warmth .  Psychiatric: He has a normal mood and affect.  Nursing note and vitals reviewed.    ED Treatments / Results  DIAGNOSTIC STUDIES:  Oxygen Saturation is 100% on RA, normal by my interpretation.    COORDINATION OF CARE:  12:53 PM Discussed treatment plan with pt at bedside and pt agreed to plan.  Labs (all labs ordered are listed, but only abnormal results are displayed) Labs Reviewed - No data to display  EKG  EKG Interpretation None       Radiology No results found.  Procedures Procedures (including critical care time)  Medications Ordered in ED Medications - No data to display   Initial Impression / Assessment and Plan / ED Course  I have reviewed the triage vital signs and the nursing notes.  Pertinent labs & imaging results that were available during my care of the patient were reviewed by me and considered in my medical decision making (see chart for details).  Clinical Course     Likely worsening cellulitis v dvt of LLE. Vancomycin given. US orderedAdmitted.    Final Clinical Impressions(s) / ED Diagnoses   Final diagnoses:  Cellulitis, unspecified cellulitis site    New Prescriptions New Prescriptions   No medications on file  I personally performed the services described in this documentation, which was scribed in my presence. The recorded information has been reviewed and is accurate.      Marily MemosJason Carys Malina, MD 08/09/16 2207

## 2016-08-09 NOTE — ED Triage Notes (Signed)
Pt c/o bilateral lower extremity swelling and redness, much worse on the left side. Pt has yellow fluid draining from the left lower leg and foot. Pt was seen by PCP last Wednesday and given Keflex to treat the swelling and redness. Pt called nurse line today and they told pt to come to ER considering the redness and swelling has worsened.

## 2016-08-09 NOTE — H&P (Signed)
History and Physical    Joseph EstimableMichael Bais RUE:[id number] DOB: 13-Apr-1966 DOA: 08/09/2016  PCP: Kirstie PeriSHAH,ASHISH, MD  Patient coming from: home  Chief Complaint: LLE discomfort and erythema  HPI: Joseph Cortez is a 50 y.o. male with medical history significant of with history of alcoholic liver cirrhosis presenting with 1 wk complaing of LLE errythema. States that despite him taking keflex his LLE rash has spread. Reportedly started at ankle. Currently involving his LLE up to his calf. He states that nothing makes it better and is progressively getting worse. Associated with pain on palpation and movement.  ED Course: *found to have elevated WBC of 15.6 with normal vital signs. We were consulted for further evaluation and recommendations.  Review of Systems: As per HPI otherwise 10 point review of systems negative.   Past Medical History:  Diagnosis Date  . Anxiety   . Cirrhosis (HCC)   . Depression   . Neuromuscular disorder (HCC)    Neuropathy  . Neuropathy [facility](HCC)     Past Surgical History:  Procedure Laterality Date  . BIOPSY  06/27/2015   Procedure: BIOPSY (Gastric);  Surgeon: Corbin Adeobert M Rourk, MD;  Location: [facility] ORS;  Service: Endoscopy;;  . CHOLECYSTECTOMY    . ESOPHAGEAL BANDING N/A 01/30/2016   Procedure: ESOPHAGEAL BANDING;  Surgeon: Corbin Adeobert M Rourk, MD;  Location: [facility] ENDO SUITE;  Service: Endoscopy;  Laterality: N/A;  . ESOPHAGOGASTRODUODENOSCOPY  January 06, 2016   [facility]: duodenitis in bullb, portal gastropathy, 2 columns of large distal esophageal varices, one with flat red spot, s/p band ligation. Needs 4 week surveillance EGD   . ESOPHAGOGASTRODUODENOSCOPY (EGD) WITH PROPOFOL N/A 06/27/2015   JYN:WGNFRMR:mild erosive reflux esophagitis, portal gastropathy, duodenal bulbar ulcer, negative H.pylori. Surveillance due yearly according to ASGE current guidelines regarding ETOH cirrhosis and ongoing ETOH abuse  . ESOPHAGOGASTRODUODENOSCOPY (EGD) WITH PROPOFOL N/A 01/30/2016   Dr.  Jena Gaussourk: Grade 2 varices s/p banding with complete deflation of varices, portal gastropathy, surveillance in May 2018     reports that he has been smoking Cigarettes.  He has never used smokeless tobacco. He reports that he does not drink alcohol or use drugs.  Allergies  Allergen Reactions  . Sertraline Other (See Comments)    Suicidal ideations    Family History  Problem Relation Age of Onset  . Colon cancer Maternal Uncle      Prior to Admission medications   Medication Sig Start Date End Date Taking? Authorizing Provider  cephALEXin (KEFLEX) 500 MG capsule Take 1 capsule (500 mg total) by mouth 4 (four) times daily. For 10 days 08/04/16  Yes Gelene MinkAnna W Boone, NP  folic acid (FOLVITE) 1 MG tablet Take 1 tablet (1 mg total) by mouth daily. Reported on 03/12/2016 03/13/16  Yes Gelene MinkAnna W Boone, NP  gabapentin (NEURONTIN) 300 MG capsule Take 300-600 mg by mouth 2 (two) times daily. 300mg  in the morning, 300mg  at 4pm and 600mg  at bedtime   Yes Historical Provider, MD  lactulose (CHRONULAC) 10 GM/15ML solution Take 30 mLs (20 g total) by mouth 2 (two) times daily. Patient taking differently: Take 10 g by mouth as needed.  01/29/16  Yes Gelene MinkAnna W Boone, NP  Multiple Vitamin (MULTIVITAMIN WITH MINERALS) TABS tablet Take 1 tablet by mouth daily.   Yes Historical Provider, MD  pantoprazole (PROTONIX) 40 MG tablet Take 1 tablet (40 mg total) by mouth 2 (two) times daily before a meal. 01/29/16  Yes Gelene MinkAnna W Boone, NP  potassium chloride SA (K-DUR,KLOR-CON) 20 MEQ tablet  Take 1 tablet (20 mEq total) by mouth 2 (two) times daily. 08/07/16  Yes Gelene MinkAnna W Boone, NP  prednisoLONE (PRELONE) 15 MG/5ML SOLN Take 13.3 mLs (40 mg total) by mouth daily before breakfast. For 2 weeks, then take 10 mls for 4 days, then 7 ml for 4 days, then 5 ml for 4 days then done. 07/28/16  Yes Gelene MinkAnna W Boone, NP  spironolactone (ALDACTONE) 50 MG tablet Take 1 tablet (50 mg total) by mouth daily. Patient taking differently: Take 100 mg by mouth 2  (two) times daily.  03/12/16  Yes Gelene MinkAnna W Boone, NP  thiamine 100 MG tablet Take 100 mg by mouth daily.  01/08/16  Yes Historical Provider, MD    Physical Exam: Vitals:   08/09/16 1231 08/09/16 1232 08/09/16 1450  BP:  120/64 122/72  Pulse:  84 75  Resp:  18 18  Temp:  97.7 F (36.5 C)   TempSrc:  Oral   SpO2:  100% 100%  Weight: 108.4 kg (239 lb)    Height: 5\' 8"  (1.727 m)        Constitutional: NAD, calm, comfortable Vitals:   08/09/16 1231 08/09/16 1232 08/09/16 1450  BP:  120/64 122/72  Pulse:  84 75  Resp:  18 18  Temp:  97.7 F (36.5 C)   TempSrc:  Oral   SpO2:  100% 100%  Weight: 108.4 kg (239 lb)    Height: 5\' 8"  (1.727 m)     Eyes: PERRL, lids and conjunctivae normal ENMT: Mucous membranes are moist. Posterior pharynx clear of any exudate or lesions.Normal dentition.  Neck: normal, supple, no masses, no thyromegaly Respiratory: clear to auscultation bilaterally, no wheezing, no crackles. Normal respiratory effort. No accessory muscle use.  Cardiovascular: Regular rate and rhythm, no murmurs / rubs / gallops. No extremity edema. 2+ pedal pulses. No carotid bruits.  Abdomen: no tenderness, no masses palpated. No hepatosplenomegaly. Bowel sounds positive.  Musculoskeletal: no clubbing / cyanosis. No pain when squeezing calf Skin: LLE erythema, calor, rubor with pain on palpation Neurologic: CN 3-12 grossly intact. Sensation intact, Strength 5/5 in all 4.  Psychiatric: Normal judgment and insight. Alert and oriented x 3. Normal mood.    Labs on Admission: I have personally reviewed following labs and imaging studies  CBC:  Recent Labs Lab 08/09/16 1319  WBC 15.6*  NEUTROABS 11.9*  HGB 13.5  HCT 39.3  MCV 101.3*  PLT 139*   Basic Metabolic Panel:  Recent Labs Lab 08/07/16 1035 08/09/16 1319  NA 132* 132*  K 3.3* 3.6  CL 99* 95*  CO2 25 26  GLUCOSE 110* 105*  BUN 12 9  CREATININE 0.91 0.98  CALCIUM 8.4* 9.2   GFR: Estimated Creatinine  Clearance: 110 mL/min (by C-G formula based on SCr of 0.98 mg/dL). Liver Function Tests:  Recent Labs Lab 08/07/16 1035 08/09/16 1319  AST 100* 85*  ALT 67* 67*  ALKPHOS 157* 197*  BILITOT 5.2* 7.1*  PROT 6.6 8.1  ALBUMIN 2.7* 3.2*   No results for input(s): LIPASE, AMYLASE in the last 168 hours. No results for input(s): AMMONIA in the last 168 hours. Coagulation Profile:  Recent Labs Lab 08/07/16 1035  INR 1.51   Cardiac Enzymes: No results for input(s): CKTOTAL, CKMB, CKMBINDEX, TROPONINI in the last 168 hours. BNP (last 3 results) No results for input(s): PROBNP in the last 8760 hours. HbA1C: No results for input(s): HGBA1C in the last 72 hours. CBG: No results for input(s): GLUCAP in the last 168  hours. Lipid Profile: No results for input(s): CHOL, HDL, LDLCALC, TRIG, CHOLHDL, LDLDIRECT in the last 72 hours. Thyroid Function Tests: No results for input(s): TSH, T4TOTAL, FREET4, T3FREE, THYROIDAB in the last 72 hours. Anemia Panel: No results for input(s): VITAMINB12, FOLATE, FERRITIN, TIBC, IRON, RETICCTPCT in the last 72 hours. Urine analysis:    Component Value Date/Time   COLORURINE AMBER (A) 07/25/2016 2010   APPEARANCEUR CLEAR 07/25/2016 2010   LABSPEC 1.015 07/25/2016 2010   PHURINE 6.0 07/25/2016 2010   GLUCOSEU 100 (A) 07/25/2016 2010   HGBUR NEGATIVE 07/25/2016 2010   BILIRUBINUR MODERATE (A) 07/25/2016 2010   KETONESUR NEGATIVE 07/25/2016 2010   PROTEINUR NEGATIVE 07/25/2016 2010   NITRITE NEGATIVE 07/25/2016 2010   LEUKOCYTESUR NEGATIVE 07/25/2016 2010   Sepsis Labs: !!!!!!!!!!!!!!!!!!!!!!!!!!!!!!!!!!!!!!!!!!!! @LABRCNTIP (procalcitonin:4,lacticidven:4) ) Recent Results (from the past 240 hour(s))  Culture, body fluid-bottle     Status: None   Collection Time: 08/03/16 12:50 PM  Result Value Ref Range Status   Specimen Description ASCITIC  Final   Special Requests 10cC EACH BOTTLES DRAWN AEROBIC AND ANAEROBIC  Final   Culture NO GROWTH 5 DAYS   Final   Report Status 08/08/2016 FINAL  Final  Gram stain     Status: None   Collection Time: 08/03/16 12:50 PM  Result Value Ref Range Status   Specimen Description ASCITIC  Final   Special Requests NONE  Final   Gram Stain   Final    CYTOSPIN SMEAR WBC PRESENT,BOTH PMN AND MONONUCLEAR NO ORGANISMS SEEN Performed at [facility]    Report Status 08/04/2016 FINAL  Final  Blood culture (routine x 2)     Status: None (Preliminary result)   Collection Time: 08/09/16  1:19 PM  Result Value Ref Range Status   Specimen Description LEFT ANTECUBITAL  Final   Special Requests BOTTLES DRAWN AEROBIC AND ANAEROBIC 4CC EACH  Final   Culture PENDING  Incomplete   Report Status PENDING  Incomplete  Blood culture (routine x 2)     Status: None (Preliminary result)   Collection Time: 08/09/16  1:38 PM  Result Value Ref Range Status   Specimen Description BLOOD LEFT HAND  Final   Special Requests BOTTLES DRAWN AEROBIC AND ANAEROBIC [facility] EACH  Final   Culture PENDING  Incomplete   Report Status PENDING  Incomplete     Radiological Exams on Admission: No results found.  EKG: Independently reviewed. Sinus with no st elevation or depression  Assessment/Plan Active Problems:   Localized swelling of lower extremity/Cellulitis - most likely infection but agree with [place] of LLE to rule out DVT - Antibiotics with vancomycin and Rocephin - blood cultures - HIV antibody    Hepatic cirrhosis (HCC) - stable liver enzymes down when compared to last values. Will continue home medication regimen    Reflux esophagitis - stable    Hyponatremia - hypervolemic hyponatremia at this point stable - reassess next am with cmp   DVT prophylaxis: heparin Code Status: full Family Communication: d/c presumed spouse at bedside Disposition Plan: med-surg Consults called: none Admission status: inpatient   Penny Pia MD [facility] Pager [phone]  If 7PM-7AM, please contact  night-coverage [email or web address] Password [facility]  08/09/2016, 2:53 PM

## 2016-08-10 ENCOUNTER — Inpatient Hospital Stay (HOSPITAL_COMMUNITY): Payer: Self-pay

## 2016-08-10 LAB — COMPREHENSIVE METABOLIC PANEL
ALBUMIN: 2.5 g/dL — AB (ref 3.5–5.0)
ALT: 47 U/L (ref 17–63)
ANION GAP: 9 (ref 5–15)
AST: 61 U/L — AB (ref 15–41)
Alkaline Phosphatase: 148 U/L — ABNORMAL HIGH (ref 38–126)
BUN: 10 mg/dL (ref 6–20)
CHLORIDE: 99 mmol/L — AB (ref 101–111)
CO2: 24 mmol/L (ref 22–32)
Calcium: 8.1 mg/dL — ABNORMAL LOW (ref 8.9–10.3)
Creatinine, Ser: 0.88 mg/dL (ref 0.61–1.24)
GFR calc Af Amer: >60 mL/min (ref >60)
GFR calc non Af Amer: >60 mL/min (ref >60)
GLUCOSE: 95 mg/dL (ref 65–99)
POTASSIUM: 3.6 mmol/L (ref 3.5–5.1)
SODIUM: 132 mmol/L — AB (ref 135–145)
TOTAL PROTEIN: 6.3 g/dL — AB (ref 6.5–8.1)
Total Bilirubin: 4.7 mg/dL — ABNORMAL HIGH (ref 0.3–1.2)

## 2016-08-10 LAB — CBC
HEMATOCRIT: 32.9 % — AB (ref 39.0–52.0)
Hemoglobin: 11.4 g/dL — ABNORMAL LOW (ref 13.0–17.0)
MCH: 35.5 pg — ABNORMAL HIGH (ref 26.0–34.0)
MCHC: 34.7 g/dL (ref 30.0–36.0)
MCV: 102.5 fL — ABNORMAL HIGH (ref 78.0–100.0)
PLATELETS: 136 10*3/uL — AB (ref 150–400)
RBC: 3.21 MIL/uL — ABNORMAL LOW (ref 4.22–5.81)
RDW: 14.6 % (ref 11.5–15.5)
WBC: 13.9 10*3/uL — AB (ref 4.0–10.5)

## 2016-08-10 MED ORDER — ONDANSETRON HCL 4 MG/2ML IJ SOLN
4.0000 mg | Freq: Four times a day (QID) | INTRAMUSCULAR | Status: DC
  Administered 2016-08-10: 4 mg via INTRAVENOUS
  Filled 2016-08-10: qty 2

## 2016-08-10 MED ORDER — ONDANSETRON HCL 4 MG/2ML IJ SOLN
4.0000 mg | Freq: Four times a day (QID) | INTRAMUSCULAR | Status: DC | PRN
  Administered 2016-08-11 (×2): 4 mg via INTRAVENOUS
  Filled 2016-08-10 (×2): qty 2

## 2016-08-10 NOTE — Care Management Note (Signed)
Case Management Note  Patient Details  Name: Joseph Cortez MRN: [id number] Date of Birth: [birth year]  Subjective/Objective:   Patient adm with cellulitis. Chart reviewed. Patient ind with ADL's, has PCP and insurance with drug coverage.    Action/Plan: No CM needs identified.   Expected Discharge Date:       08/11/2016           Expected Discharge Plan:  Home/Self Care  In-House Referral:  NA  Discharge planning Services  CM Consult  Post Acute Care Choice:  NA Choice offered to:  NA  DME Arranged:    DME Agency:     HH Arranged:    HH Agency:     Status of Service:  Completed, signed off  If discussed at [employer] Length [employer] Stay Meetings, dates discussed:    Additional Comments:  Joseph Cortez, Joseph OilerSharley Diane, RN 08/10/2016, 9:53 AM

## 2016-08-10 NOTE — Progress Notes (Signed)
PROGRESS NOTE    Cristopher EstimableMichael Gutt  [id number] DOB: Feb 18, 1966 DOA: 08/09/2016 PCP: Kirstie PeriSHAH,ASHISH, MD   Brief Narrative:  50 y.o. male with medical history significant of with history of alcoholic liver cirrhosis presenting with 1 wk complaing of LLE errythema. States that despite him taking keflex his LLE rash has spread. Reportedly started at ankle. Currently involving his LLE up to his calf.   Assessment & Plan: Localized swelling of lower extremity/Cellulitis - Pt on Ceftriaxone and vancomycin. Will continue - blood cultures obtained and pending - Doppler was negative for DVT  Active Problems:   Hepatic cirrhosis (HCC) - stable currently continue current regimen.    Reflux esophagitis - stable on protonix    Hyponatremia - mild. Most likely hypervolemic hyponatremia  DVT prophylaxis: Heparin Code Status: Full Family Communication: discussed with patient Disposition Plan: Pending improvement in condition.   Consultants:   None   Procedures: None   Antimicrobials: Vanc and Rocephin  Subjective: Pt has no new complaints. No acute issues overnight.  Objective: Vitals:   08/09/16 1450 08/09/16 1621 08/09/16 2125 08/10/16 0537  BP: 122/72 120/70 106/62 129/74  Pulse: 75 78 (!) 40 97  Resp: 18 20 20 18   Temp:  97.8 F (36.6 C) 99.5 F (37.5 C) 98.2 F (36.8 C)  TempSrc:  Oral Oral Oral  SpO2: 100% 100% 96% 97%  Weight:  107.5 kg (237 lb 1.6 oz)    Height:  5\' 8"  (1.727 m)      Intake/Output Summary (Last 24 hours) at 08/10/16 1412 Last data filed at 08/10/16 0537  Gross per 24 hour  Intake              740 ml  Output                0 ml  Net              740 ml   Filed Weights   08/09/16 1231 08/09/16 1621  Weight: 108.4 kg (239 lb) 107.5 kg (237 lb 1.6 oz)    Examination:  General exam: Appears calm and comfortable, in nad. Respiratory system: Clear to auscultation. Respiratory effort normal. Cardiovascular system: S1 & S2 heard, RRR. No  JVD, murmurs, rubs, gallops or clicks. No pedal edema. Gastrointestinal system: Abdomen is nondistended, soft and nontender. Obese as such difficult exam. Normal bowel sounds heard. Central nervous system: Alert and oriented. No focal neurological deficits. Extremities: Symmetric 5 x 5 power. Skin: LLE erythema, calor, rubor with pain on palpation Psychiatry: Judgement and insight appear normal. Mood & affect appropriate.   Data Reviewed: I have personally reviewed following labs and imaging studies  CBC:  Recent Labs Lab 08/09/16 1319 08/10/16 0546  WBC 15.6* 13.9*  NEUTROABS 11.9*  --   HGB 13.5 11.4*  HCT 39.3 32.9*  MCV 101.3* 102.5*  PLT 139* 136*   Basic Metabolic Panel:  Recent Labs Lab 08/07/16 1035 08/09/16 1319 08/10/16 0546  NA 132* 132* 132*  K 3.3* 3.6 3.6  CL 99* 95* 99*  CO2 25 26 24   GLUCOSE 110* 105* 95  BUN 12 9 10   CREATININE 0.91 0.98 0.88  CALCIUM 8.4* 9.2 8.1*   GFR: Estimated Creatinine Clearance: 122 mL/min (by C-G formula based on SCr of 0.88 mg/dL). Liver Function Tests:  Recent Labs Lab 08/07/16 1035 08/09/16 1319 08/10/16 0546  AST 100* 85* 61*  ALT 67* 67* 47  ALKPHOS 157* 197* 148*  BILITOT 5.2* 7.1* 4.7*  PROT 6.6 8.1 6.3*  ALBUMIN 2.7* 3.2* 2.5*   No results for input(s): LIPASE, AMYLASE in the last 168 hours. No results for input(s): AMMONIA in the last 168 hours. Coagulation Profile:  Recent Labs Lab 08/07/16 1035  INR 1.51   Cardiac Enzymes: No results for input(s): CKTOTAL, CKMB, CKMBINDEX, TROPONINI in the last 168 hours. BNP (last 3 results) No results for input(s): PROBNP in the last 8760 hours. HbA1C: No results for input(s): HGBA1C in the last 72 hours. CBG: No results for input(s): GLUCAP in the last 168 hours. Lipid Profile: No results for input(s): CHOL, HDL, LDLCALC, TRIG, CHOLHDL, LDLDIRECT in the last 72 hours. Thyroid Function Tests: No results for input(s): TSH, T4TOTAL, FREET4, T3FREE,  THYROIDAB in the last 72 hours. Anemia Panel: No results for input(s): VITAMINB12, FOLATE, FERRITIN, TIBC, IRON, RETICCTPCT in the last 72 hours. Sepsis Labs: No results for input(s): PROCALCITON, LATICACIDVEN in the last 168 hours.  Recent Results (from the past 240 hour(s))  Culture, body fluid-bottle     Status: None   Collection Time: 08/03/16 12:50 PM  Result Value Ref Range Status   Specimen Description ASCITIC  Final   Special Requests 10cC EACH BOTTLES DRAWN AEROBIC AND ANAEROBIC  Final   Culture NO GROWTH 5 DAYS  Final   Report Status 08/08/2016 FINAL  Final  Gram stain     Status: None   Collection Time: 08/03/16 12:50 PM  Result Value Ref Range Status   Specimen Description ASCITIC  Final   Special Requests NONE  Final   Gram Stain   Final    CYTOSPIN SMEAR WBC PRESENT,BOTH PMN AND MONONUCLEAR NO ORGANISMS SEEN Performed at [facility]    Report Status 08/04/2016 FINAL  Final  Blood culture (routine x 2)     Status: None (Preliminary result)   Collection Time: 08/09/16  1:19 PM  Result Value Ref Range Status   Specimen Description LEFT ANTECUBITAL  Final   Special Requests BOTTLES DRAWN AEROBIC AND ANAEROBIC 4CC EACH  Final   Culture NO GROWTH < 24 HOURS  Final   Report Status PENDING  Incomplete  Blood culture (routine x 2)     Status: None (Preliminary result)   Collection Time: 08/09/16  1:38 PM  Result Value Ref Range Status   Specimen Description BLOOD LEFT HAND  Final   Special Requests BOTTLES DRAWN AEROBIC AND ANAEROBIC 6CC EACH  Final   Culture NO GROWTH < 24 HOURS  Final   Report Status PENDING  Incomplete         Radiology Studies: US Venous Img Lower Unilateral Left  Result Date: 08/10/2016 CLINICAL DATA:  LEFT LOWER EXTREMITY PAIN AND SWELLING EXAM: LEFT LOWER EXTREMITY VENOUS DOPPLER ULTRASOUND TECHNIQUE: Gray-scale sonography with graded compression, as well as color Doppler and duplex ultrasound were performed to evaluate the lower  extremity deep venous systems from the level of the common femoral vein and including the common femoral, femoral, profunda femoral, popliteal and calf veins including the posterior tibial, peroneal and gastrocnemius veins when visible. The superficial great saphenous vein was also interrogated. Spectral Doppler was utilized to evaluate flow at rest and with distal augmentation maneuvers in the common femoral, femoral and popliteal veins. COMPARISON:  08/03/2016 FINDINGS: Contralateral Common Femoral Vein: Respiratory phasicity is normal and symmetric with the symptomatic side. No evidence of thrombus. Normal compressibility. Common Femoral Vein: No evidence of thrombus. Normal compressibility, respiratory phasicity and response to augmentation. Saphenofemoral Junction: No evidence of thrombus. Normal compressibility and flow on color Doppler  imaging. Profunda Femoral Vein: No evidence of thrombus. Normal compressibility and flow on color Doppler imaging. Femoral Vein: No evidence of thrombus. Normal compressibility, respiratory phasicity and response to augmentation. Popliteal Vein: No evidence of thrombus. Normal compressibility, respiratory phasicity and response to augmentation. Calf Veins: No evidence of thrombus. Normal compressibility and flow on color Doppler imaging. Superficial Great Saphenous Vein: No evidence of thrombus. Normal compressibility and flow on color Doppler imaging. Venous Reflux:  None. Other Findings: Subcutaneous left ankle and foot edema as before. There is a fatty replaced elongated left inguinal lymph node, unchanged measuring 3.6 x 0.6 x 2.3 cm. IMPRESSION: Negative for left lower extremity DVT. Left ankle and foot subcutaneous edema Electronically Signed   By: Judie PetitM.  Shick M.D.   On: 08/10/2016 10:31        Scheduled Meds: . cefTRIAXone (ROCEPHIN)  IV  2 g Intravenous Q24H  . folic acid  1 mg Oral Daily  . gabapentin  300 mg Oral 2 times per day  . gabapentin  600 mg Oral QHS   . heparin  5,000 Units Subcutaneous Q8H  . lactulose  10 g Oral BID  . multivitamin with minerals  1 tablet Oral Daily  . pantoprazole  40 mg Oral BID AC  . prednisoLONE  40 mg Oral QAC breakfast   Followed by  . [START ON 08/11/2016] prednisoLONE  30 mg Oral QAC breakfast   Followed by  . [START ON 08/15/2016] prednisoLONE  21 mg Oral QAC breakfast   Followed by  . [START ON 08/19/2016] prednisoLONE  15 mg Oral QAC breakfast  . sodium chloride flush  3 mL Intravenous Q12H  . spironolactone  100 mg Oral BID  . thiamine  100 mg Oral Daily  . vancomycin  1,000 mg Intravenous Q8H   Continuous Infusions:   LOS: 1 day    Time spent: > 35 minutes  Penny PiaVEGA, Dyneshia Baccam, MD [facility] Pager [phone]  If 7PM-7AM, please contact night-coverage [email or web address] Password [email or web address] 08/10/2016, 2:12 PM

## 2016-08-11 ENCOUNTER — Inpatient Hospital Stay (HOSPITAL_COMMUNITY): Payer: Self-pay

## 2016-08-11 DIAGNOSIS — M7989 Other specified soft tissue disorders: Secondary | ICD-10-CM

## 2016-08-11 LAB — HIV ANTIBODY (ROUTINE TESTING W REFLEX): HIV Screen 4th Generation wRfx: NONREACTIVE

## 2016-08-11 MED ORDER — HYDROXYZINE HCL 25 MG PO TABS
25.0000 mg | ORAL_TABLET | Freq: Four times a day (QID) | ORAL | Status: DC | PRN
  Administered 2016-08-11 – 2016-08-13 (×3): 25 mg via ORAL
  Filled 2016-08-11 (×3): qty 1

## 2016-08-11 NOTE — Progress Notes (Signed)
PROGRESS NOTE    Joseph EstimableMichael Cortez  ZOX:[id number] DOB: 16-Apr-1966 DOA: 08/09/2016 PCP: Kirstie PeriSHAH,ASHISH, MD   Brief Narrative:  50 y.o. male with medical history significant of with history of alcoholic liver cirrhosis presenting with 1 wk complaing of LLE errythema. States that despite him taking keflex his LLE rash has spread. Reportedly started at ankle. Currently involving his LLE up to his calf.   Assessment & Plan: Localized swelling of lower extremity/Cellulitis - Continue patient on Ceftriaxone and vancomycin.  - Blood cultures obtained and pending - Doppler was negative for DVT - Will obtain ABI of lower extremities given edema  Active Problems:   Hepatic cirrhosis (HCC) - stable currently continue current regimen.    Reflux esophagitis - stable on protonix    Hyponatremia - Mild. Most likely hypervolemic hyponatremia  Anxiety - Atarax prescribed for anxiety  DVT prophylaxis: Heparin Code Status: Full Family Communication: discussed with patient Disposition Plan: Pending improvement in condition.   Consultants:   None   Procedures: None   Antimicrobials: Vanc and Rocephin  Subjective: Pt was concerned about anxiety and requested medication for anxiety  Objective: Vitals:   08/10/16 1507 08/10/16 2210 08/11/16 0511 08/11/16 1434  BP: 116/74 133/80 133/78 115/64  Pulse: 90 (!) 102 (!) 108 82  Resp: 20 20 18 18   Temp: 98.1 F (36.7 C) 97 F (36.1 C) 97.9 F (36.6 C) 97.8 F (36.6 C)  TempSrc: Oral Oral Oral   SpO2: 100% 100% 98% 98%  Weight:      Height:        Intake/Output Summary (Last 24 hours) at 08/11/16 1444 Last data filed at 08/11/16 1300  Gross per 24 hour  Intake             1933 ml  Output                0 ml  Net             1933 ml   Filed Weights   08/09/16 1231 08/09/16 1621  Weight: 108.4 kg (239 lb) 107.5 kg (237 lb 1.6 oz)    Examination:  General exam: Appears calm and comfortable, in nad. Respiratory system: Clear  to auscultation. Respiratory effort normal. Cardiovascular system: S1 & S2 heard, RRR. No JVD, murmurs, rubs, gallops or clicks. No pedal edema. Gastrointestinal system: Abdomen is nondistended, soft and nontender. Obese as such difficult exam. Normal bowel sounds heard. Central nervous system: Alert and oriented. No focal neurological deficits. Extremities: Symmetric 5 x 5 power. Skin: LLE erythema, calor, rubor with pain on palpation, RLE erythema  Psychiatry: Judgement and insight appear normal. Mood & affect appropriate.   Data Reviewed: I have personally reviewed following labs and imaging studies  CBC:  Recent Labs Lab 08/09/16 1319 08/10/16 0546  WBC 15.6* 13.9*  NEUTROABS 11.9*  --   HGB 13.5 11.4*  HCT 39.3 32.9*  MCV 101.3* 102.5*  PLT 139* 136*   Basic Metabolic Panel:  Recent Labs Lab 08/07/16 1035 08/09/16 1319 08/10/16 0546  NA 132* 132* 132*  K 3.3* 3.6 3.6  CL 99* 95* 99*  CO2 25 26 24   GLUCOSE 110* 105* 95  BUN 12 9 10   CREATININE 0.91 0.98 0.88  CALCIUM 8.4* 9.2 8.1*   GFR: Estimated Creatinine Clearance: 122 mL/min (by C-G formula based on SCr of 0.88 mg/dL). Liver Function Tests:  Recent Labs Lab 08/07/16 1035 08/09/16 1319 08/10/16 0546  AST 100* 85* 61*  ALT 67* 67* 47  ALKPHOS 157* 197* 148*  BILITOT 5.2* 7.1* 4.7*  PROT 6.6 8.1 6.3*  ALBUMIN 2.7* 3.2* 2.5*   No results for input(s): LIPASE, AMYLASE in the last 168 hours. No results for input(s): AMMONIA in the last 168 hours. Coagulation Profile:  Recent Labs Lab 08/07/16 1035  INR 1.51   Cardiac Enzymes: No results for input(s): CKTOTAL, CKMB, CKMBINDEX, TROPONINI in the last 168 hours. BNP (last 3 results) No results for input(s): PROBNP in the last 8760 hours. HbA1C: No results for input(s): HGBA1C in the last 72 hours. CBG: No results for input(s): GLUCAP in the last 168 hours. Lipid Profile: No results for input(s): CHOL, HDL, LDLCALC, TRIG, CHOLHDL, LDLDIRECT in  the last 72 hours. Thyroid Function Tests: No results for input(s): TSH, T4TOTAL, FREET4, T3FREE, THYROIDAB in the last 72 hours. Anemia Panel: No results for input(s): VITAMINB12, FOLATE, FERRITIN, TIBC, IRON, RETICCTPCT in the last 72 hours. Sepsis Labs: No results for input(s): PROCALCITON, LATICACIDVEN in the last 168 hours.  Recent Results (from the past 240 hour(s))  Culture, body fluid-bottle     Status: None   Collection Time: 08/03/16 12:50 PM  Result Value Ref Range Status   Specimen Description ASCITIC  Final   Special Requests 10cC EACH BOTTLES DRAWN AEROBIC AND ANAEROBIC  Final   Culture NO GROWTH 5 DAYS  Final   Report Status 08/08/2016 FINAL  Final  Gram stain     Status: None   Collection Time: 08/03/16 12:50 PM  Result Value Ref Range Status   Specimen Description ASCITIC  Final   Special Requests NONE  Final   Gram Stain   Final    CYTOSPIN SMEAR WBC PRESENT,BOTH PMN AND MONONUCLEAR NO ORGANISMS SEEN Performed at [facility]    Report Status 08/04/2016 FINAL  Final  Blood culture (routine x 2)     Status: None (Preliminary result)   Collection Time: 08/09/16  1:19 PM  Result Value Ref Range Status   Specimen Description LEFT ANTECUBITAL  Final   Special Requests BOTTLES DRAWN AEROBIC AND ANAEROBIC 4CC EACH  Final   Culture NO GROWTH 2 DAYS  Final   Report Status PENDING  Incomplete  Blood culture (routine x 2)     Status: None (Preliminary result)   Collection Time: 08/09/16  1:38 PM  Result Value Ref Range Status   Specimen Description BLOOD LEFT HAND  Final   Special Requests BOTTLES DRAWN AEROBIC AND ANAEROBIC 6CC EACH  Final   Culture NO GROWTH 2 DAYS  Final   Report Status PENDING  Incomplete         Radiology Studies: US Venous Img Lower Unilateral Left  Result Date: 08/10/2016 CLINICAL DATA:  LEFT LOWER EXTREMITY PAIN AND SWELLING EXAM: LEFT LOWER EXTREMITY VENOUS DOPPLER ULTRASOUND TECHNIQUE: Gray-scale sonography with graded  compression, as well as color Doppler and duplex ultrasound were performed to evaluate the lower extremity deep venous systems from the level of the common femoral vein and including the common femoral, femoral, profunda femoral, popliteal and calf veins including the posterior tibial, peroneal and gastrocnemius veins when visible. The superficial great saphenous vein was also interrogated. Spectral Doppler was utilized to evaluate flow at rest and with distal augmentation maneuvers in the common femoral, femoral and popliteal veins. COMPARISON:  08/03/2016 FINDINGS: Contralateral Common Femoral Vein: Respiratory phasicity is normal and symmetric with the symptomatic side. No evidence of thrombus. Normal compressibility. Common Femoral Vein: No evidence of thrombus. Normal compressibility, respiratory phasicity and response to augmentation.  Saphenofemoral Junction: No evidence of thrombus. Normal compressibility and flow on color Doppler imaging. Profunda Femoral Vein: No evidence of thrombus. Normal compressibility and flow on color Doppler imaging. Femoral Vein: No evidence of thrombus. Normal compressibility, respiratory phasicity and response to augmentation. Popliteal Vein: No evidence of thrombus. Normal compressibility, respiratory phasicity and response to augmentation. Calf Veins: No evidence of thrombus. Normal compressibility and flow on color Doppler imaging. Superficial Great Saphenous Vein: No evidence of thrombus. Normal compressibility and flow on color Doppler imaging. Venous Reflux:  None. Other Findings: Subcutaneous left ankle and foot edema as before. There is a fatty replaced elongated left inguinal lymph node, unchanged measuring 3.6 x 0.6 x 2.3 cm. IMPRESSION: Negative for left lower extremity DVT. Left ankle and foot subcutaneous edema Electronically Signed   By: Judie PetitM.  Shick M.D.   On: 08/10/2016 10:31        Scheduled Meds: . cefTRIAXone (ROCEPHIN)  IV  2 g Intravenous Q24H  . folic  acid  1 mg Oral Daily  . gabapentin  300 mg Oral 2 times per day  . gabapentin  600 mg Oral QHS  . heparin  5,000 Units Subcutaneous Q8H  . lactulose  10 g Oral BID  . multivitamin with minerals  1 tablet Oral Daily  . pantoprazole  40 mg Oral BID AC  . prednisoLONE  30 mg Oral QAC breakfast   Followed by  . [START ON 08/15/2016] prednisoLONE  21 mg Oral QAC breakfast   Followed by  . [START ON 08/19/2016] prednisoLONE  15 mg Oral QAC breakfast  . sodium chloride flush  3 mL Intravenous Q12H  . spironolactone  100 mg Oral BID  . thiamine  100 mg Oral Daily  . vancomycin  1,000 mg Intravenous Q8H   Continuous Infusions:   LOS: 2 days    Time spent: > 35 minutes  Penny PiaVEGA, Hamad Whyte, MD [facility] Pager [phone]  If 7PM-7AM, please contact night-coverage [email or web address] Password [email or web address] 08/11/2016, 2:44 PM

## 2016-08-11 NOTE — Progress Notes (Signed)
CC'D TO PCP °

## 2016-08-12 ENCOUNTER — Ambulatory Visit: Payer: Medicaid - Out of State | Admitting: Gastroenterology

## 2016-08-12 DIAGNOSIS — L03032 Cellulitis of left toe: Secondary | ICD-10-CM

## 2016-08-12 LAB — OSMOLALITY, URINE: OSMOLALITY UR: 297 mosm/kg — AB (ref 300–900)

## 2016-08-12 LAB — URIC ACID: URIC ACID, SERUM: 4.4 mg/dL (ref 4.4–7.6)

## 2016-08-12 LAB — BASIC METABOLIC PANEL
ANION GAP: 8 (ref 5–15)
BUN: 8 mg/dL (ref 6–20)
CO2: 23 mmol/L (ref 22–32)
Calcium: 8.8 mg/dL — ABNORMAL LOW (ref 8.9–10.3)
Chloride: 99 mmol/L — ABNORMAL LOW (ref 101–111)
Creatinine, Ser: 0.82 mg/dL (ref 0.61–1.24)
GFR calc Af Amer: >60 mL/min (ref >60)
Glucose, Bld: 104 mg/dL — ABNORMAL HIGH (ref 65–99)
POTASSIUM: 3.6 mmol/L (ref 3.5–5.1)
SODIUM: 130 mmol/L — AB (ref 135–145)

## 2016-08-12 LAB — CBC
HEMATOCRIT: 32.9 % — AB (ref 39.0–52.0)
HEMOGLOBIN: 11.1 g/dL — AB (ref 13.0–17.0)
MCH: 34.3 pg — ABNORMAL HIGH (ref 26.0–34.0)
MCHC: 33.7 g/dL (ref 30.0–36.0)
MCV: 101.5 fL — ABNORMAL HIGH (ref 78.0–100.0)
Platelets: 146 10*3/uL — ABNORMAL LOW (ref 150–400)
RBC: 3.24 MIL/uL — ABNORMAL LOW (ref 4.22–5.81)
RDW: 14.5 % (ref 11.5–15.5)
WBC: 13.7 10*3/uL — AB (ref 4.0–10.5)

## 2016-08-12 LAB — OSMOLALITY: Osmolality: 272 mOsm/kg — ABNORMAL LOW (ref 275–295)

## 2016-08-12 LAB — SODIUM, URINE, RANDOM: SODIUM UR: 41 mmol/L

## 2016-08-12 MED ORDER — FUROSEMIDE 10 MG/ML IJ SOLN
40.0000 mg | Freq: Every day | INTRAMUSCULAR | Status: DC
  Administered 2016-08-12: 40 mg via INTRAVENOUS
  Filled 2016-08-12: qty 4

## 2016-08-12 MED ORDER — FUROSEMIDE 10 MG/ML IJ SOLN: 40.0000 mg | Freq: Once | INTRAMUSCULAR | Status: DC

## 2016-08-12 NOTE — Progress Notes (Signed)
PROGRESS NOTE    Joseph Cortez  ZOX:[id number] DOB: [birth year] DOA: 08/09/2016 PCP: Kirstie Peri, MD   Brief Narrative:   50 y.o. male with medical history significant of with history of alcoholic liver cirrhosis presenting with 1 wk complaing of LLE errythema. States that despite him taking keflex his LLE rash has spread. Reportedly started at ankle. Currently involving his LLE up to his calf.   Assessment & Plan:  Bilateral lower extremity erythema and swelling left more than right. This likely is due to fluid retention, he was recently placed on prednisone, also some element of cellulitis is possible. For now continue empiric Rocephin and vancomycin, follow cultures, venous and arterial ultrasounds are unremarkable. We'll start him on diuretics and monitor.     Chronic alcoholic cirrhosis (HCC) - stable currently continue current regimen. He follows with Dr. Benard Rink. Counseled to quit alcohol last drink few weeks ago.   Reflux esophagitis - stable on protonix   Hyponatremia - Due to fluid overload, placed on Lasix, urine sodium osmolality and serum osmolar T ordered. Monitor BMP.   Anxiety - Atarax prescribed for anxiety.   DVT prophylaxis: Heparin Code Status: Full Family Communication: discussed with patient Disposition Plan: Pending improvement in condition.   Consultants:   None   Procedures: None  Anti-infectives    Start     Dose/Rate Route Frequency Ordered Stop   08/09/16 1800  cefTRIAXone (ROCEPHIN) 2 g in dextrose 5 % 50 mL IVPB  Status:  Discontinued     2 g 100 mL/hr over 30 Minutes Intravenous  Once 08/09/16 1754 08/09/16 1756   08/09/16 1800  cefTRIAXone (ROCEPHIN) 2 g in dextrose 5 % 50 mL IVPB     2 g 100 mL/hr over 30 Minutes Intravenous Every 24 hours 08/09/16 1756     08/09/16 1800  vancomycin (VANCOCIN) IVPB 1000 mg/200 mL premix     1,000 mg 200 mL/hr over 60 Minutes Intravenous Every 8 hours 08/09/16 1758     08/09/16 1745  vancomycin (VANCOCIN)  IVPB 1000 mg/200 mL premix  Status:  Discontinued     1,000 mg 200 mL/hr over 60 Minutes Intravenous  Once 08/09/16 1739 08/09/16 1748   08/09/16 1300  vancomycin (VANCOCIN) IVPB 1000 mg/200 mL premix     1,000 mg 200 mL/hr over 60 Minutes Intravenous  Once 08/09/16 1259 08/09/16 1451      Subjective:  Pt in chair no fevers, no headache, no chest- abd pain, no SOB  Objective: Vitals:   08/11/16 0511 08/11/16 1434 08/11/16 2124 08/12/16 0644  BP: 133/78 115/64 124/75 118/72  Pulse: (!) 108 82 85 85  Resp: 18 18 18 18   Temp: 97.9 F (36.6 C) 97.8 F (36.6 C) 97.7 F (36.5 C) 97.8 F (36.6 C)  TempSrc: Oral  Oral Oral  SpO2: 98% 98% 98% 98%  Weight:      Height:        Intake/Output Summary (Last 24 hours) at 08/12/16 1329 Last data filed at 08/12/16 1100  Gross per 24 hour  Intake             1253 ml  Output                0 ml  Net             1253 ml   Filed Weights   08/09/16 1231 08/09/16 1621  Weight: 108.4 kg (239 lb) 107.5 kg (237 lb 1.6 oz)    Examination:  General exam: Appears  calm and comfortable, in nad. Respiratory system: Clear to auscultation. Respiratory effort normal. Cardiovascular system: S1 & S2 heard, RRR. No JVD, murmurs, rubs, gallops or clicks. No pedal edema. Gastrointestinal system: Abdomen is nondistended, soft and nontender. Obese as such difficult exam. Normal bowel sounds heard. Central nervous system: Alert and oriented. No focal neurological deficits. Extremities: Symmetric 5 x 5 power. Skin: L>R leg erythema,  rubor with pain on palpation, RLE erythema  Psychiatry: Judgement and insight appear normal. Mood & affect appropriate.   Data Reviewed: I have personally reviewed following labs and imaging studies  CBC:  Recent Labs Lab 08/09/16 1319 08/10/16 0546 08/12/16 0631  WBC 15.6* 13.9* 13.7*  NEUTROABS 11.9*  --   --   HGB 13.5 11.4* 11.1*  HCT 39.3 32.9* 32.9*  MCV 101.3* 102.5* 101.5*  PLT 139* 136* 146*   Basic  Metabolic Panel:  Recent Labs Lab 08/07/16 1035 08/09/16 1319 08/10/16 0546 08/12/16 0631  NA 132* 132* 132* 130*  K 3.3* 3.6 3.6 3.6  CL 99* 95* 99* 99*  CO2 25 26 24 23   GLUCOSE 110* 105* 95 104*  BUN 12 9 10 8   CREATININE 0.91 0.98 0.88 0.82  CALCIUM 8.4* 9.2 8.1* 8.8*   GFR: Estimated Creatinine Clearance: 130.9 mL/min (by C-G formula based on SCr of 0.82 mg/dL). Liver Function Tests:  Recent Labs Lab 08/07/16 1035 08/09/16 1319 08/10/16 0546  AST 100* 85* 61*  ALT 67* 67* 47  ALKPHOS 157* 197* 148*  BILITOT 5.2* 7.1* 4.7*  PROT 6.6 8.1 6.3*  ALBUMIN 2.7* 3.2* 2.5*   No results for input(s): LIPASE, AMYLASE in the last 168 hours. No results for input(s): AMMONIA in the last 168 hours. Coagulation Profile:  Recent Labs Lab 08/07/16 1035  INR 1.51   Cardiac Enzymes: No results for input(s): CKTOTAL, CKMB, CKMBINDEX, TROPONINI in the last 168 hours. BNP (last 3 results) No results for input(s): PROBNP in the last 8760 hours. HbA1C: No results for input(s): HGBA1C in the last 72 hours. CBG: No results for input(s): GLUCAP in the last 168 hours. Lipid Profile: No results for input(s): CHOL, HDL, LDLCALC, TRIG, CHOLHDL, LDLDIRECT in the last 72 hours. Thyroid Function Tests: No results for input(s): TSH, T4TOTAL, FREET4, T3FREE, THYROIDAB in the last 72 hours. Anemia Panel: No results for input(s): VITAMINB12, FOLATE, FERRITIN, TIBC, IRON, RETICCTPCT in the last 72 hours. Sepsis Labs: No results for input(s): PROCALCITON, LATICACIDVEN in the last 168 hours.  Recent Results (from the past 240 hour(s))  Culture, body fluid-bottle     Status: None   Collection Time: 08/03/16 12:50 PM  Result Value Ref Range Status   Specimen Description ASCITIC  Final   Special Requests 10cC EACH BOTTLES DRAWN AEROBIC AND ANAEROBIC  Final   Culture NO GROWTH 5 DAYS  Final   Report Status 08/08/2016 FINAL  Final  Gram stain     Status: None   Collection Time: 08/03/16  12:50 PM  Result Value Ref Range Status   Specimen Description ASCITIC  Final   Special Requests NONE  Final   Gram Stain   Final    CYTOSPIN SMEAR WBC PRESENT,BOTH PMN AND MONONUCLEAR NO ORGANISMS SEEN [facility]formed [facility]    Report Status 08/04/2016 FINAL  Final  Blood culture (routine x 2)     Status: None (Preliminary result)   Collection Time: 08/09/16  1:19 PM  Result Value Ref Range Status   Specimen Description LEFT ANTECUBITAL  Final   Special  Requests BOTTLES DRAWN AEROBIC AND ANAEROBIC 4CC EACH  Final   Culture NO GROWTH 3 DAYS  Final   Report Status PENDING  Incomplete  Blood culture (routine x 2)     Status: None (Preliminary result)   Collection Time: 08/09/16  1:38 PM  Result Value Ref Range Status   Specimen Description BLOOD LEFT HAND  Final   Special Requests BOTTLES DRAWN AEROBIC AND ANAEROBIC [facility]6CC EACH  Final   Culture NO GROWTH 3 DAYS  Final   Report Status PENDING  Incomplete         Radiology Studies: [place] Arterial Seg Single  Result Date: 08/11/2016 CLINICAL DATA:  Left foot ulcers. Bilateral medial calf Lister's. Hypertension. Tobacco abuse. EXAM: NONINVASIVE PHYSIOLOGIC VASCULAR STUDY OF BILATERAL LOWER EXTREMITIES TECHNIQUE: Evaluation of both lower extremities were performed at rest, including calculation of ankle-brachial indices with single level Doppler, pressure recording. COMPARISON:  None. FINDINGS: Right ABI:  1.18 Left ABI:  1.04 Right Lower Extremity:  Triphasic waveforms distally. Left Lower Extremity:  Triphasic waveforms distally. IMPRESSION: No evidence of hemodynamically significant lower extremity arterial occlusive disease at rest. Electronically Signed   By: Corlis Leak  Hassell M.D.   On: 08/11/2016 15:50        Scheduled Meds: . cefTRIAXone (ROCEPHIN)  IV  2 g Intravenous Q24H  . folic acid  1 mg Oral Daily  . furosemide  40 mg Intravenous Daily  . gabapentin  300 mg Oral 2 times per day  . gabapentin  600 mg Oral QHS  .  heparin  5,000 Units Subcutaneous Q8H  . lactulose  10 g Oral BID  . multivitamin with minerals  1 tablet Oral Daily  . pantoprazole  40 mg Oral BID AC  . prednisoLONE  30 mg Oral QAC breakfast   Followed by  . [START ON 08/15/2016] prednisoLONE  21 mg Oral QAC breakfast   Followed by  . [START ON 08/19/2016] prednisoLONE  15 mg Oral QAC breakfast  . sodium chloride flush  3 mL Intravenous Q12H  . spironolactone  100 mg Oral BID  . thiamine  100 mg Oral Daily  . vancomycin  1,000 mg Intravenous Q8H   Continuous Infusions:   LOS: 3 days    Time spent: > 35 minutes  Signature  SINGH,PRASHANT K M.D on 08/12/2016 at 1:30 PM  Between 7am to 7pm - Pager - [phone], After 7pm go to [email or web address] - password [email or web address]  [facility]  - Office  [phone]

## 2016-08-12 NOTE — Clinical Social Work Note (Signed)
CSW received consult for family meeting this afternoon. Discussed with pt who states that they are wanting to complete advance directives. Notified chaplain. Pt will let CSW know if there are further needs. CSW signing off.   Derenda FennelKara Annsley Akkerman, LCSW [phone]

## 2016-08-13 DIAGNOSIS — K7030 Alcoholic cirrhosis of liver without ascites: Secondary | ICD-10-CM

## 2016-08-13 LAB — BASIC METABOLIC PANEL
ANION GAP: 8 (ref 5–15)
BUN: 7 mg/dL (ref 6–20)
CALCIUM: 8.8 mg/dL — AB (ref 8.9–10.3)
CO2: 24 mmol/L (ref 22–32)
Chloride: 98 mmol/L — ABNORMAL LOW (ref 101–111)
Creatinine, Ser: 0.87 mg/dL (ref 0.61–1.24)
GFR calc Af Amer: >60 mL/min (ref >60)
GLUCOSE: 124 mg/dL — AB (ref 65–99)
Potassium: 3.7 mmol/L (ref 3.5–5.1)
SODIUM: 130 mmol/L — AB (ref 135–145)

## 2016-08-13 MED ORDER — DOXYCYCLINE HYCLATE 100 MG PO TABS
100.0000 mg | ORAL_TABLET | Freq: Two times a day (BID) | ORAL | Status: DC
  Administered 2016-08-13 – 2016-08-15 (×5): 100 mg via ORAL
  Filled 2016-08-13 (×5): qty 1

## 2016-08-13 MED ORDER — POTASSIUM CHLORIDE CRYS ER 20 MEQ PO TBCR: 40.0000 meq | EXTENDED_RELEASE_TABLET | Freq: Four times a day (QID) | ORAL | Status: DC

## 2016-08-13 MED ORDER — POTASSIUM CHLORIDE CRYS ER 20 MEQ PO TBCR
40.0000 meq | EXTENDED_RELEASE_TABLET | Freq: Every day | ORAL | Status: DC
  Administered 2016-08-13 – 2016-08-15 (×3): 40 meq via ORAL
  Filled 2016-08-13 (×3): qty 2

## 2016-08-13 MED ORDER — FUROSEMIDE 10 MG/ML IJ SOLN
60.0000 mg | Freq: Two times a day (BID) | INTRAMUSCULAR | Status: DC
  Administered 2016-08-13 – 2016-08-15 (×5): 60 mg via INTRAVENOUS
  Filled 2016-08-13 (×5): qty 6

## 2016-08-13 NOTE — Progress Notes (Addendum)
PROGRESS NOTE    Joseph Cortez  QIO:[id number] DOB: [birth year] DOA: 08/09/2016 PCP: Kirstie PeriSHAH,ASHISH, MD   Brief Narrative:   50 y.o. male with medical history significant of with history of alcoholic liver cirrhosis presenting with 1 wk complaing of LLE errythema. States that despite him taking keflex his LLE rash has spread. Reportedly started at ankle. Currently involving his LLE up to his calf.   Assessment & Plan:  Bilateral lower extremity erythema and swelling left more than right. This likely is due to fluid retention, he was recently placed on prednisone, also some element of cellulitis is possible. Highly doubt an active infection, taper down antibiotics to oral doxycycline, follow cultures, venous and arterial ultrasounds are unremarkable. Placed hin on diuretics, elevate legs and monitor.     Chronic alcoholic cirrhosis (HCC) - stable currently continue current regimen of steroid taper. He follows with Dr. Benard Rinkoark. Counseled to quit alcohol last drink few weeks ago.   Reflux esophagitis - stable on protonix   Hyponatremia - Due to fluid overload, placed on IV Lasix, Monitor BMP.   Anxiety - Atarax prescribed for anxiety.   DVT prophylaxis: Heparin Code Status: Full Family Communication: discussed with patient Disposition Plan: Pending improvement in condition.   Consultants:   None   Procedures: None  Anti-infectives    Start     Dose/Rate Route Frequency Ordered Stop   08/09/16 1800  cefTRIAXone (ROCEPHIN) 2 g in dextrose 5 % 50 mL IVPB  Status:  Discontinued     2 g 100 mL/hr over 30 Minutes Intravenous  Once 08/09/16 1754 08/09/16 1756   08/09/16 1800  cefTRIAXone (ROCEPHIN) 2 g in dextrose 5 % 50 mL IVPB     2 g 100 mL/hr over 30 Minutes Intravenous Every 24 hours 08/09/16 1756     08/09/16 1800  vancomycin (VANCOCIN) IVPB 1000 mg/200 mL premix     1,000 mg 200 mL/hr over 60 Minutes Intravenous Every 8 hours 08/09/16 1758     08/09/16 1745  vancomycin  (VANCOCIN) IVPB 1000 mg/200 mL premix  Status:  Discontinued     1,000 mg 200 mL/hr over 60 Minutes Intravenous  Once 08/09/16 1739 08/09/16 1748   08/09/16 1300  vancomycin (VANCOCIN) IVPB 1000 mg/200 mL premix     1,000 mg 200 mL/hr over 60 Minutes Intravenous  Once 08/09/16 1259 08/09/16 1451      Subjective:  Pt in chair no fevers, no headache, no chest- abd pain, no SOB, legs feel slightly better.  Objective: Vitals:   08/12/16 0644 08/12/16 1456 08/12/16 2030 08/13/16 0501  BP: 118/72 110/75 112/68 (!) 111/56  Pulse: 85 (!) 108 90 93  Resp: 18 18 18 18   Temp: 97.8 F (36.6 C) 98.4 F (36.9 C) 98.1 F (36.7 C) 98.4 F (36.9 C)  TempSrc: Oral Oral Oral Oral  SpO2: 98% 100% 100% 96%  Weight:      Height:        Intake/Output Summary (Last 24 hours) at 08/13/16 0912 Last data filed at 08/12/16 1700  Gross per 24 hour  Intake              480 ml  Output                0 ml  Net              480 ml   Filed Weights   08/09/16 1231 08/09/16 1621  Weight: 108.4 kg (239 lb) 107.5 kg (237 lb 1.6 oz)  Examination:  General exam: Appears calm and comfortable, in nad. Respiratory system: Clear to auscultation. Respiratory effort normal. Cardiovascular system: S1 & S2 heard, RRR. No JVD, murmurs, rubs, gallops or clicks. No pedal edema. Gastrointestinal system: Abdomen is nondistended, soft and nontender. Obese as such difficult exam. Normal bowel sounds heard. Central nervous system: Alert and oriented. No focal neurological deficits. Extremities: Symmetric 5 x 5 power. Skin: L>R leg erythema,  rubor with pain on palpation, RLE erythema  Psychiatry: Judgement and insight appear normal. Mood & affect appropriate.   Data Reviewed: I have personally reviewed following labs and imaging studies  CBC:  Recent Labs Lab 08/09/16 1319 08/10/16 0546 08/12/16 0631  WBC 15.6* 13.9* 13.7*  NEUTROABS 11.9*  --   --   HGB 13.5 11.4* 11.1*  HCT 39.3 32.9* 32.9*  MCV 101.3*  102.5* 101.5*  PLT 139* 136* 146*   Basic Metabolic Panel:  Recent Labs Lab 08/07/16 1035 08/09/16 1319 08/10/16 0546 08/12/16 0631 08/13/16 0546  NA 132* 132* 132* 130* 130*  K 3.3* 3.6 3.6 3.6 3.7  CL 99* 95* 99* 99* 98*  CO2 25 26 24 23 24   GLUCOSE 110* 105* 95 104* 124*  BUN 12 9 10 8 7   CREATININE 0.91 0.98 0.88 0.82 0.87  CALCIUM 8.4* 9.2 8.1* 8.8* 8.8*   GFR: Estimated Creatinine Clearance: 123.4 mL/min (by C-G formula based on SCr of 0.87 mg/dL). Liver Function Tests:  Recent Labs Lab 08/07/16 1035 08/09/16 1319 08/10/16 0546  AST 100* 85* 61*  ALT 67* 67* 47  ALKPHOS 157* 197* 148*  BILITOT 5.2* 7.1* 4.7*  PROT 6.6 8.1 6.3*  ALBUMIN 2.7* 3.2* 2.5*   No results for input(s): LIPASE, AMYLASE in the last 168 hours. No results for input(s): AMMONIA in the last 168 hours. Coagulation Profile:  Recent Labs Lab 08/07/16 1035  INR 1.51   Cardiac Enzymes: No results for input(s): CKTOTAL, CKMB, CKMBINDEX, TROPONINI in the last 168 hours. BNP (last 3 results) No results for input(s): PROBNP in the last 8760 hours. HbA1C: No results for input(s): HGBA1C in the last 72 hours. CBG: No results for input(s): GLUCAP in the last 168 hours. Lipid Profile: No results for input(s): CHOL, HDL, LDLCALC, TRIG, CHOLHDL, LDLDIRECT in the last 72 hours. Thyroid Function Tests: No results for input(s): TSH, T4TOTAL, FREET4, T3FREE, THYROIDAB in the last 72 hours. Anemia Panel: No results for input(s): VITAMINB12, FOLATE, FERRITIN, TIBC, IRON, RETICCTPCT in the last 72 hours. Sepsis Labs: No results for input(s): PROCALCITON, LATICACIDVEN in the last 168 hours.  Recent Results (from the past 240 hour(s))  Culture, body fluid-bottle     Status: None   Collection Time: 08/03/16 12:50 PM  Result Value Ref Range Status   Specimen Description ASCITIC  Final   Special Requests 10cC EACH BOTTLES DRAWN AEROBIC AND ANAEROBIC  Final   Culture NO GROWTH 5 DAYS  Final   Report  Status 08/08/2016 FINAL  Final  Gram stain     Status: None   Collection Time: 08/03/16 12:50 PM  Result Value Ref Range Status   Specimen Description ASCITIC  Final   Special Requests NONE  Final   Gram Stain   Final    CYTOSPIN SMEAR WBC PRESENT,BOTH PMN AND MONONUCLEAR NO ORGANISMS SEEN Performed at [facility]    Report Status 08/04/2016 FINAL  Final  Blood culture (routine x 2)     Status: None (Preliminary result)   Collection Time: 08/09/16  1:19 PM  Result  Value Ref Range Status   Specimen Description LEFT ANTECUBITAL  Final   Special Requests BOTTLES DRAWN AEROBIC AND ANAEROBIC 4CC EACH  Final   Culture NO GROWTH 4 DAYS  Final   Report Status PENDING  Incomplete  Blood culture (routine x 2)     Status: None (Preliminary result)   Collection Time: 08/09/16  1:38 PM  Result Value Ref Range Status   Specimen Description BLOOD LEFT HAND  Final   Special Requests BOTTLES DRAWN AEROBIC AND ANAEROBIC [facility]CC EACH  Final   Culture NO GROWTH 4 DAYS  Final   Report Status PENDING  Incomplete         Radiology Studies: [place] Arterial Seg Single  Result Date: 08/11/2016 CLINICAL DATA:  Left foot ulcers. Bilateral medial calf Lister's. Hypertension. Tobacco abuse. EXAM: NONINVASIVE PHYSIOLOGIC VASCULAR STUDY OF BILATERAL LOWER EXTREMITIES TECHNIQUE: Evaluation of both lower extremities were performed at rest, including calculation of ankle-brachial indices with single level Doppler, pressure recording. COMPARISON:  None. FINDINGS: Right ABI:  1.18 Left ABI:  1.04 Right Lower Extremity:  Triphasic waveforms distally. Left Lower Extremity:  Triphasic waveforms distally. IMPRESSION: No evidence of hemodynamically significant lower extremity arterial occlusive disease at rest. Electronically Signed   By: Corlis Leak  Hassell M.D.   On: 08/11/2016 15:50        Scheduled Meds: . cefTRIAXone (ROCEPHIN)  IV  2 g Intravenous Q24H  . folic acid  1 mg Oral Daily  . furosemide  60 mg  Intravenous BID  . gabapentin  300 mg Oral 2 times per day  . gabapentin  600 mg Oral QHS  . heparin  5,000 Units Subcutaneous Q8H  . lactulose  10 g Oral BID  . multivitamin with minerals  1 tablet Oral Daily  . pantoprazole  40 mg Oral BID AC  . potassium chloride  40 mEq Oral Daily  . prednisoLONE  30 mg Oral QAC breakfast   Followed by  . [START ON 08/15/2016] prednisoLONE  21 mg Oral QAC breakfast   Followed by  . [START ON 08/19/2016] prednisoLONE  15 mg Oral QAC breakfast  . sodium chloride flush  3 mL Intravenous Q12H  . spironolactone  100 mg Oral BID  . thiamine  100 mg Oral Daily  . vancomycin  1,000 mg Intravenous Q8H   Continuous Infusions:   LOS: 4 days    Time spent: > 35 minutes  Signature  Susa RaringSINGH,PRASHANT K M.D on 08/13/2016 at 9:12 AM  Between 7am to 7pm - Pager - [phone], After 7pm go to [email or web address] - password [email or web address]  [facility]  - Office  [phone]

## 2016-08-14 LAB — CULTURE, BLOOD (ROUTINE X 2)
Culture: NO GROWTH
Culture: NO GROWTH

## 2016-08-14 LAB — BASIC METABOLIC PANEL
Anion gap: 9 (ref 5–15)
BUN: 9 mg/dL (ref 6–20)
CALCIUM: 8.5 mg/dL — AB (ref 8.9–10.3)
CO2: 25 mmol/L (ref 22–32)
Chloride: 96 mmol/L — ABNORMAL LOW (ref 101–111)
Creatinine, Ser: 0.87 mg/dL (ref 0.61–1.24)
GFR calc Af Amer: >60 mL/min (ref >60)
GLUCOSE: 100 mg/dL — AB (ref 65–99)
Potassium: 3.7 mmol/L (ref 3.5–5.1)
Sodium: 130 mmol/L — ABNORMAL LOW (ref 135–145)

## 2016-08-14 LAB — MAGNESIUM: Magnesium: 1.6 mg/dL — ABNORMAL LOW (ref 1.7–2.4)

## 2016-08-14 MED ORDER — TEMAZEPAM 15 MG PO CAPS
15.0000 mg | ORAL_CAPSULE | Freq: Every evening | ORAL | Status: DC | PRN
  Administered 2016-08-14: 15 mg via ORAL
  Filled 2016-08-14: qty 1

## 2016-08-14 MED ORDER — MAGNESIUM SULFATE 2 GM/50ML IV SOLN
2.0000 g | Freq: Once | INTRAVENOUS | Status: AC
  Administered 2016-08-14: 2 g via INTRAVENOUS
  Filled 2016-08-14: qty 50

## 2016-08-14 NOTE — Care Management Note (Signed)
Case Management Note  Patient Details  Name: Cristopher EstimableMichael Cadman MRN: [id number] Date of Birth: 1966/06/21 Expected Discharge Date:    08/15/2016              Expected Discharge Plan:  Home/Self Care  In-House Referral:  NA  Discharge planning Services  CM Consult, MATCH Program, Indigent Health Clinic  Post Acute Care Choice:  NA Choice offered to:  NA  Status of Service:  Completed, signed off Additional Comments:  Anticipate DC home tomorrow. [facility] [place] [facility], he understands he is eligible for voucher only once a year. Pt verbalizes understanding and has no other concerns regarding DC.   Malcolm Metrohildress, Antwuan Eckley Demske, RN 08/14/2016, 1:31 PM

## 2016-08-14 NOTE — Progress Notes (Signed)
PROGRESS NOTE    Joseph Cortez  ZOX:[id number] DOB: 1966-06-16 DOA: 08/09/2016 PCP: Kirstie Peri, MD   Brief Narrative:   50 y.o. male with medical history significant of with history of alcoholic liver cirrhosis presenting with 1 wk complaing of LLE errythema. States that despite him taking keflex his LLE rash has spread. Reportedly started at ankle. Currently involving his LLE up to his calf.   Assessment & Plan:  Bilateral lower extremity erythema and swelling left more than right. This likely is due to fluid retention, he was recently placed on prednisone, also some element of cellulitis is possible. Highly doubt an active infection, taper down antibiotics to oral doxycycline, follow cultures, venous and arterial ultrasounds are unremarkable. Placed him on diuretics, PT and RN told to elevate legs and monitor.  He has massive leg edema left more than right and he is refusing to elevate his legs, counseled again, UNNA boots applied, also counseled on urinating in the urine so that his urine output can be measured.      Chronic alcoholic cirrhosis (HCC) - stable currently continue current regimen of steroid taper. He follows with Dr. Benard Rink. Counseled to quit alcohol last drink few weeks ago.  GERD - stable on protonix  Hyponatremia - Due to fluid overload, placed on IV Lasix, Monitor BMP.   Anxiety - Atarax prescribed for anxiety.   DVT prophylaxis: Heparin Code Status: Full Family Communication: discussed with patient Disposition Plan: Pending improvement in condition.   Consultants:   None   Procedures: None  Anti-infectives    Start     Dose/Rate Route Frequency Ordered Stop   08/13/16 1000  doxycycline (VIBRA-TABS) tablet 100 mg     100 mg Oral Every 12 hours 08/13/16 0914     08/09/16 1800  cefTRIAXone (ROCEPHIN) 2 g in dextrose 5 % 50 mL IVPB  Status:  Discontinued     2 g 100 mL/hr over 30 Minutes Intravenous  Once 08/09/16 1754 08/09/16 1756   08/09/16 1800   cefTRIAXone (ROCEPHIN) 2 g in dextrose 5 % 50 mL IVPB  Status:  Discontinued     2 g 100 mL/hr over 30 Minutes Intravenous Every 24 hours 08/09/16 1756 08/13/16 0914   08/09/16 1800  vancomycin (VANCOCIN) IVPB 1000 mg/200 mL premix  Status:  Discontinued     1,000 mg 200 mL/hr over 60 Minutes Intravenous Every 8 hours 08/09/16 1758 08/13/16 0914   08/09/16 1745  vancomycin (VANCOCIN) IVPB 1000 mg/200 mL premix  Status:  Discontinued     1,000 mg 200 mL/hr over 60 Minutes Intravenous  Once 08/09/16 1739 08/09/16 1748   08/09/16 1300  vancomycin (VANCOCIN) IVPB 1000 mg/200 mL premix     1,000 mg 200 mL/hr over 60 Minutes Intravenous  Once 08/09/16 1259 08/09/16 1451      Subjective:  Pt in chair no fevers, no headache, no chest- abd pain, no SOB, legs feel slightly better.  Objective: Vitals:   08/13/16 0501 08/13/16 1500 08/13/16 2109 08/14/16 0431  BP: (!) 111/56 125/72 120/68 (!) 104/55  Pulse: 93 97 96 90  Resp: 18 18 18 18   Temp: 98.4 F (36.9 C) 97.7 F (36.5 C) 98.6 F (37 C) 98.3 F (36.8 C)  TempSrc: Oral Oral Oral Oral  SpO2: 96% 98% 98% 96%  Weight:      Height:        Intake/Output Summary (Last 24 hours) at 08/14/16 0911 Last data filed at 08/13/16 1849  Gross per 24 hour  Intake  720 ml  Output                0 ml  Net              720 ml   Filed Weights   08/09/16 1231 08/09/16 1621  Weight: 108.4 kg (239 lb) 107.5 kg (237 lb 1.6 oz)    Examination:  General exam: Appears calm and comfortable, in nad. Respiratory system: Clear to auscultation. Respiratory effort normal. Cardiovascular system: S1 & S2 heard, RRR. No JVD, murmurs, rubs, gallops or clicks. No pedal edema. Gastrointestinal system: Abdomen is nondistended, soft and nontender. Obese as such difficult exam. Normal bowel sounds heard. Central nervous system: Alert and oriented. No focal neurological deficits. Extremities: Symmetric 5 x 5 power. Skin: L>R leg erythema,  rubor  with pain on palpation, RLE erythema  Psychiatry: Judgement and insight appear normal. Mood & affect appropriate.   Data Reviewed: I have personally reviewed following labs and imaging studies  CBC:  Recent Labs Lab 08/09/16 1319 08/10/16 0546 08/12/16 0631  WBC 15.6* 13.9* 13.7*  NEUTROABS 11.9*  --   --   HGB 13.5 11.4* 11.1*  HCT 39.3 32.9* 32.9*  MCV 101.3* 102.5* 101.5*  PLT 139* 136* 146*   Basic Metabolic Panel:  Recent Labs Lab 08/09/16 1319 08/10/16 0546 08/12/16 0631 08/13/16 0546 08/14/16 0605  NA 132* 132* 130* 130* 130*  K 3.6 3.6 3.6 3.7 3.7  CL 95* 99* 99* 98* 96*  CO2 26 24 23 24 25   GLUCOSE 105* 95 104* 124* 100*  BUN 9 10 8 7 9   CREATININE 0.98 0.88 0.82 0.87 0.87  CALCIUM 9.2 8.1* 8.8* 8.8* 8.5*  MG  --   --   --   --  1.6*   GFR: Estimated Creatinine Clearance: 123.4 mL/min (by C-G formula based on SCr of 0.87 mg/dL). Liver Function Tests:  Recent Labs Lab 08/07/16 1035 08/09/16 1319 08/10/16 0546  AST 100* 85* 61*  ALT 67* 67* 47  ALKPHOS 157* 197* 148*  BILITOT 5.2* 7.1* 4.7*  PROT 6.6 8.1 6.3*  ALBUMIN 2.7* 3.2* 2.5*   No results for input(s): LIPASE, AMYLASE in the last 168 hours. No results for input(s): AMMONIA in the last 168 hours. Coagulation Profile:  Recent Labs Lab 08/07/16 1035  INR 1.51   Cardiac Enzymes: No results for input(s): CKTOTAL, CKMB, CKMBINDEX, TROPONINI in the last 168 hours. BNP (last 3 results) No results for input(s): PROBNP in the last 8760 hours. HbA1C: No results for input(s): HGBA1C in the last 72 hours. CBG: No results for input(s): GLUCAP in the last 168 hours. Lipid Profile: No results for input(s): CHOL, HDL, LDLCALC, TRIG, CHOLHDL, LDLDIRECT in the last 72 hours. Thyroid Function Tests: No results for input(s): TSH, T4TOTAL, FREET4, T3FREE, THYROIDAB in the last 72 hours. Anemia Panel: No results for input(s): VITAMINB12, FOLATE, FERRITIN, TIBC, IRON, RETICCTPCT in the last 72  hours. Sepsis Labs: No results for input(s): PROCALCITON, LATICACIDVEN in the last 168 hours.  Recent Results (from the past 240 hour(s))  Blood culture (routine x 2)     Status: None (Preliminary result)   Collection Time: 08/09/16  1:19 PM  Result Value Ref Range Status   Specimen Description LEFT ANTECUBITAL  Final   Special Requests BOTTLES DRAWN AEROBIC AND ANAEROBIC 4CC EACH  Final   Culture NO GROWTH 4 DAYS  Final   Report Status PENDING  Incomplete  Blood culture (routine x 2)     Status:  None (Preliminary result)   Collection Time: 08/09/16  1:38 PM  Result Value Ref Range Status   Specimen Description BLOOD LEFT HAND  Final   Special Requests BOTTLES DRAWN AEROBIC AND ANAEROBIC [facility]6CC EACH  Final   Culture NO GROWTH 4 DAYS  Final   Report Status PENDING  Incomplete         Radiology Studies: No results found.      Scheduled Meds: . doxycycline  100 mg Oral Q12H  . folic acid  1 mg Oral Daily  . furosemide  60 mg Intravenous BID  . gabapentin  300 mg Oral 2 times per day  . gabapentin  600 mg Oral QHS  . heparin  5,000 Units Subcutaneous Q8H  . lactulose  10 g Oral BID  . multivitamin with minerals  1 tablet Oral Daily  . pantoprazole  40 mg Oral BID AC  . potassium chloride  40 mEq Oral Daily  . prednisoLONE  30 mg Oral QAC breakfast   Followed by  . [START ON 08/15/2016] prednisoLONE  21 mg Oral QAC breakfast   Followed by  . [START ON 08/19/2016] prednisoLONE  15 mg Oral QAC breakfast  . sodium chloride flush  3 mL Intravenous Q12H  . spironolactone  100 mg Oral BID  . thiamine  100 mg Oral Daily   Continuous Infusions:   LOS: 5 days    Time spent: > 35 minutes  Signature  Susa RaringSINGH,PRASHANT K M.D on 08/14/2016 at 9:11 AM  Between 7am to 7pm - Pager - [phone], After 7pm go to [email or web address] - password [email or web address]  [facility]  - Office  [phone]

## 2016-08-15 MED ORDER — FUROSEMIDE 40 MG PO TABS: 40.0000 mg | ORAL_TABLET | Freq: Two times a day (BID) | ORAL | 0 refills | Status: DC

## 2016-08-15 MED ORDER — DOXYCYCLINE HYCLATE 100 MG PO TABS: 100.0000 mg | ORAL_TABLET | Freq: Two times a day (BID) | ORAL | 0 refills | Status: DC

## 2016-08-15 NOTE — Progress Notes (Addendum)
Patient had been refusing to keep legs elevated, siting up in chair with legs down currently.  Stated that he did for most of the day yesterday, but that it didn't help.  Instructed patient to still continue doing this, as it may take some time for the swelling to go down, and that keeping them down on the ground is not giving the swelling a chance at all to decrease.  Verbalizes understanding - states he will get back in bed and elevate legs after taking medicines. Patient also refused blood draw this AM, didn't think it was needed since he was going home.  Is agreeable now - will ask lab to come back. -Spoke with MD, blood work not needed as he is being discharged

## 2016-08-15 NOTE — Discharge Summary (Addendum)
Joseph EstimableMichael Cortez ZOX:[id number] DOB: 10-23-1965 DOA: 08/09/2016  PCP: Kirstie PeriSHAH,ASHISH, MD  Admit date: 08/09/2016  Discharge date: 08/15/2016  Admitted From: Home   Disposition:  Home   Recommendations for Outpatient Follow-up:   Follow up with PCP in 1-2 weeks  PCP Please obtain BMP/CBC, 2 view CXR in 1week,  (see Discharge instructions)   PCP Please follow up on the following pending results: Follow BMP, diuretic dose, bilateral lower extremity edema, any signs of secondary infection closely.   Home Health: None   Equipment/Devices: None Consultations: None Discharge Condition: Fair as he is severely noncompliant  CODE STATUS: Full   Diet Recommendation: Diet 2 gram sodium 1.2 L daily fluid restriction  Chief Complaint  Patient presents with  . Leg Pain     Brief history of present illness from the day of admission and additional interim summary    50 y.o.malewith medical history significant of with history of alcoholic liver cirrhosis presenting with 1 wk complaing of LLE errythema. States that despite him taking keflex his LLE rash has spread. Reportedly started at ankle. Currently involving his LLE up to his calf.  He underwent lower extremity venous and arterial duplex both of which were unremarkable, his symptoms started after he was started on a steroid taper for his liver problems, further examination and clinical follow-up suggested that patient actually had massive fluid retention causing erythema rather than cellulitis and active infection. He was placed on IV Lasix, UNNA boots were applied, he was strictly advised multiple times to keep his legs elevated. However patient remained severely noncompliant, did not comply with fluid restriction, did not comply with leg elevation, refused his labs today. At  this point we'll have to discharge him as we are not helping him in the hospital and with his refusal to comply with labs I'm unable to titrate his medications further.  Hospital issues addressed     Bilateral lower extremity erythema and swelling left more than right. This likely is due to fluid retention, he was recently placed on prednisone, also some element of cellulitis is possible. Highly doubt an active infection, taper down antibiotics to oral doxycycline For 5 more days, also place him on oral Lasix continue his Aldactone and potassium supplementation as before, so far cultures are negative, venous and arterial ultrasounds are unremarkable. Placed him on diuretics, PT and RN told to elevate legs and monitor.  He was placed on IV Lasix, UNNA boots were applied, he was strictly advised multiple times to keep his legs elevated. However patient remained severely noncompliant, did not comply with fluid restriction, did not store his urine in the urinal, we were unable to monitor intake and output correctly, did not comply with leg elevation, refused his labs today. At this point we'll have to discharge him as we are not helping him in the hospital and with his refusal to comply as above.  Patient's request called patient's sister Ms. Hawley on 08/15/2016 10:30 AM, went to answering machine.   Chronic alcoholic cirrhosis (HCC) - stable currently  continue current regimen of steroid taper. He follows with Dr. Benard Rink follow with him ion 1 week. Counseled to quit alcohol last drink few weeks ago.  GERD - stable on protonix  Hyponatremia - Due to fluid overload, placed on IV Lasix, now refusing Labs, PCP to check.  Anxiety - Atarax prescribed for anxiety.    Discharge diagnosis     Active Problems:   Hepatic cirrhosis (HCC)   Reflux esophagitis   Hyponatremia   Localized swelling of lower extremity   Cellulitis    Discharge instructions    Discharge Instructions    Discharge  instructions    Complete by:  As directed    Follow with Primary MD SHAH,ASHISH, MD in 7 days, keep your legs elevated.   Get CBC, CMP, 2 view Chest X ray checked  by Primary MD in 2-3 days ( we routinely change or add medications that can affect your baseline labs and fluid status, therefore we recommend that you get the mentioned basic workup next visit with your PCP, your PCP may decide not to get them or add new tests based on their clinical decision)   Activity: As tolerated with Full fall precautions use walker/cane & assistance as needed   Disposition Home     Diet:   Diet 2 gram sodium   Check your Weight same time everyday, if you gain over 2 pounds, or you develop in leg swelling, experience more shortness of breath or chest pain, call your Primary MD immediately. Follow Cardiac Low Salt Diet and 1.2 lit/day fluid restriction.   On your next visit with your primary care physician please Get Medicines reviewed and adjusted.   Please request your Prim.MD to go over all Hospital Tests and Procedure/Radiological results at the follow up, please get all Hospital records sent to your Prim MD by signing hospital release before you go home.   If you experience worsening of your admission symptoms, develop shortness of breath, life threatening emergency, suicidal or homicidal thoughts you must seek medical attention immediately by calling 911 or calling your MD immediately  if symptoms less severe.  You Must read complete instructions/literature along with all the possible adverse reactions/side effects for all the Medicines you take and that have been prescribed to you. Take any new Medicines after you have completely understood and accpet all the possible adverse reactions/side effects.   Do not drive, operate heavy machinery, perform activities at heights, swimming or participation in water activities or provide baby sitting services if your were admitted for syncope or siezures until  you have seen by Primary MD or a Neurologist and advised to do so again.  Do not drive when taking Pain medications.    Do not take more than prescribed Pain, Sleep and Anxiety Medications  Special Instructions: If you have smoked or chewed Tobacco  in the last 2 yrs please stop smoking, stop any regular Alcohol  and or any Recreational drug use.  Wear Seat belts while driving.   Please note  You were cared for by a hospitalist during your hospital stay. If you have any questions about your discharge medications or the care you received while you were in the hospital after you are discharged, you can call the unit and asked to speak with the hospitalist on call if the hospitalist that took care of you is not available. Once you are discharged, your primary care physician will handle any further medical issues. Please note that NO REFILLS for any discharge medications  will be authorized once you are discharged, as it is imperative that you return to your primary care physician (or establish a relationship with a primary care physician if you do not have one) for your aftercare needs so that they can reassess your need for medications and monitor your lab values.   Increase activity slowly    Complete by:  As directed       Discharge Medications     Medication List    STOP taking these medications   cephALEXin 500 MG capsule Commonly known as:  KEFLEX     TAKE these medications   doxycycline 100 MG tablet Commonly known as:  VIBRA-TABS Take 1 tablet (100 mg total) by mouth every 12 (twelve) hours.   folic acid 1 MG tablet Commonly known as:  FOLVITE Take 1 tablet (1 mg total) by mouth daily. Reported on 03/12/2016   furosemide 40 MG tablet Commonly known as:  LASIX Take 1 tablet (40 mg total) by mouth 2 (two) times daily.   gabapentin 300 MG capsule Commonly known as:  NEURONTIN Take 300-600 mg by mouth 2 (two) times daily. 300mg  in the morning, 300mg  at 4pm and 600mg  at  bedtime   lactulose 10 GM/15ML solution Commonly known as:  CHRONULAC Take 30 mLs (20 g total) by mouth 2 (two) times daily. What changed:  how much to take  when to take this  reasons to take this   multivitamin with minerals Tabs tablet Take 1 tablet by mouth daily.   pantoprazole 40 MG tablet Commonly known as:  PROTONIX Take 1 tablet (40 mg total) by mouth 2 (two) times daily before a meal.   potassium chloride SA 20 MEQ tablet Commonly known as:  K-DUR,KLOR-CON Take 1 tablet (20 mEq total) by mouth 2 (two) times daily.   prednisoLONE 15 MG/5ML Soln Commonly known as:  PRELONE Take 13.3 mLs (40 mg total) by mouth daily before breakfast. For 2 weeks, then take 10 mls for 4 days, then 7 ml for 4 days, then 5 ml for 4 days then done.   spironolactone 50 MG tablet Commonly known as:  ALDACTONE Take 1 tablet (50 mg total) by mouth daily. What changed:  how much to take  when to take this   thiamine 100 MG tablet Take 100 mg by mouth daily.       Follow-up Information    [facility] [place]18/2016.   Why:  11 AM Contact information: [phone]       SHAH,ASHISH, MD. Schedule an appointment as soon as possible for a visit in 2 day(s).   Specialty:  Internal Medicine Contact information: [address] [place] [address] [phone]        Eula Listenobert Rourk, MD. Schedule an appointment as soon as possible for a visit in 1 week(s).   Specialty:  Gastroenterology Contact information: [address] [place] [address] [phone]           Major procedures and Radiology Reports - PLEASE review detailed and final reports thoroughly  -         Dg Chest 2 View  Result Date: 08/01/2016 CLINICAL DATA:  Increasing shortness of breath since Saturday, history cirrhosis EXAM: CHEST  2 VIEW COMPARISON:  07/25/2016 FINDINGS: Respiratory motion artifacts on lateral view limit assessment. Normal heart size, mediastinal contours,  and pulmonary vascularity. Lungs clear. No pleural effusion or pneumothorax. Bones demineralized. IMPRESSION: No acute abnormalities. Electronically Signed   By: Loraine LericheMark  Tyron Russell M.D.   On: 08/01/2016 13:23   Dg Chest 2 View  Result Date: 07/25/2016 CLINICAL DATA:  Abdominal pain and swelling history of cirrhosis EXAM: CHEST  2 VIEW COMPARISON:  01/05/2016 FINDINGS: Tiny left-sided pleural effusion. Hazy basilar atelectasis on the left. No consolidation. Stable cardiomediastinal silhouette. Low lung volumes. No pneumothorax. Oval nodular opacity at the right lower lung zone could relate to a nipple shadow. IMPRESSION: Tiny left pleural effusion with mild left basilar atelectasis. Electronically Signed   By: Jasmine Pang M.D.   On: 07/25/2016 21:21   US Abdomen Limited  Result Date: 08/03/2016 CLINICAL DATA:  Cirrhosis. INDICATION: Cirrhosis.  Ascites. EXAM: LIMITED ABDOMEN ULTRASOUND FOR ASCITES ULTRASOUND GUIDED RIGHT PARACENTESIS TECHNIQUE: Limited ultrasound survey for ascites was performed in all four abdominal quadrants. COMPARISON:  None. PROCEDURE: Following this, a Yueh catheter was introduced. An ultrasound image was saved for documentation purposes. The paracentesis was performed. The catheter was removed and a dressing was applied. The patient tolerated the procedure well without immediate post procedural complication. FINDINGS: A total of approximately 4,000 cc of yellowish fluid was removed. Samples were sent to the laboratory as requested by the clinical team. IMPRESSION: Limited abdominal ultrasound demonstrates extensive ascites. Successful ultrasound-guided paracentesis yielding 4 liters of peritoneal fluid. Electronically Signed   By: Francene Boyers M.D.   On: 08/03/2016 15:30   US Venous Img Lower Unilateral Left  Result Date: 08/10/2016 CLINICAL DATA:  LEFT LOWER EXTREMITY PAIN AND SWELLING EXAM: LEFT LOWER EXTREMITY VENOUS DOPPLER ULTRASOUND TECHNIQUE: Gray-scale sonography with graded  compression, as well as color Doppler and duplex ultrasound were performed to evaluate the lower extremity deep venous systems from the level of the common femoral vein and including the common femoral, femoral, profunda femoral, popliteal and calf veins including the posterior tibial, peroneal and gastrocnemius veins when visible. The superficial great saphenous vein was also interrogated. Spectral Doppler was utilized to evaluate flow at rest and with distal augmentation maneuvers in the common femoral, femoral and popliteal veins. COMPARISON:  08/03/2016 FINDINGS: Contralateral Common Femoral Vein: Respiratory phasicity is normal and symmetric with the symptomatic side. No evidence of thrombus. Normal compressibility. Common Femoral Vein: No evidence of thrombus. Normal compressibility, respiratory phasicity and response to augmentation. Saphenofemoral Junction: No evidence of thrombus. Normal compressibility and flow on color Doppler imaging. Profunda Femoral Vein: No evidence of thrombus. Normal compressibility and flow on color Doppler imaging. Femoral Vein: No evidence of thrombus. Normal compressibility, respiratory phasicity and response to augmentation. Popliteal Vein: No evidence of thrombus. Normal compressibility, respiratory phasicity and response to augmentation. Calf Veins: No evidence of thrombus. Normal compressibility and flow on color Doppler imaging. Superficial Great Saphenous Vein: No evidence of thrombus. Normal compressibility and flow on color Doppler imaging. Venous Reflux:  None. Other Findings: Subcutaneous left ankle and foot edema as before. There is a fatty replaced elongated left inguinal lymph node, unchanged measuring 3.6 x 0.6 x 2.3 cm. IMPRESSION: Negative for left lower extremity DVT. Left ankle and foot subcutaneous edema Electronically Signed   By: Judie Petit.  Shick M.D.   On: 08/10/2016 10:31   US Venous Img Lower Unilateral Left  Result Date: 08/03/2016 CLINICAL DATA:  Calf and  ankle swelling x1 week, pain x1 day EXAM: LEFT LOWER EXTREMITY VENOUS DOPPLER ULTRASOUND TECHNIQUE: Gray-scale sonography with compression, as well as color and duplex ultrasound, were performed to evaluate the deep venous system from the level of the common femoral vein through the popliteal and proximal calf veins. COMPARISON:  None  FINDINGS: Normal compressibility of the common femoral, superficial femoral, and popliteal veins, as well as the proximal calf veins. No filling defects to suggest DVT on grayscale or color Doppler imaging. Doppler waveforms show normal direction of venous flow, normal respiratory phasicity and response to augmentation. Marked subcutaneous edema in the calf and ankle. Visualized segments of the saphenous venous system normal in caliber and compressibility. Survey views of the contralateral common femoral vein are unremarkable. IMPRESSION: No evidence of  lower extremity deep vein thrombosis, left. Electronically Signed   By: Corlis Leak M.D.   On: 08/03/2016 14:26   US Arterial Seg Single  Result Date: 08/11/2016 CLINICAL DATA:  Left foot ulcers. Bilateral medial calf Lister's. Hypertension. Tobacco abuse. EXAM: NONINVASIVE PHYSIOLOGIC VASCULAR STUDY OF BILATERAL LOWER EXTREMITIES TECHNIQUE: Evaluation of both lower extremities were performed at rest, including calculation of ankle-brachial indices with single level Doppler, pressure recording. COMPARISON:  None. FINDINGS: Right ABI:  1.18 Left ABI:  1.04 Right Lower Extremity:  Triphasic waveforms distally. Left Lower Extremity:  Triphasic waveforms distally. IMPRESSION: No evidence of hemodynamically significant lower extremity arterial occlusive disease at rest. Electronically Signed   By: Corlis Leak M.D.   On: 08/11/2016 15:50   US Paracentesis  Result Date: 08/07/2016 INDICATION: Cirrhosis. EXAM: ULTRASOUND GUIDED PARACENTESIS MEDICATIONS: None. COMPLICATIONS: None immediate. PROCEDURE: Informed written consent was  obtained from the patient after a discussion of the risks, benefits and alternatives to treatment. A timeout was performed prior to the initiation of the procedure. Initial ultrasound scanning demonstrates a large amount of ascites within the right lower abdominal quadrant. The right lower abdomen was prepped and draped in the usual sterile fashion. 1% lidocaine with epinephrine was used for local anesthesia. Following this, a 19 gauge, 7-cm, Yueh catheter was introduced. An ultrasound image was saved for documentation purposes. The paracentesis was performed. The catheter was removed and a dressing was applied. The patient tolerated the procedure well without immediate post procedural complication. FINDINGS: A total of approximately 4300 of serous fluid was removed. IMPRESSION: Successful ultrasound-guided paracentesis yielding 4.3 liters of peritoneal fluid. Electronically Signed   By: Jeronimo Greaves M.D.   On: 08/07/2016 11:03   US Paracentesis  Result Date: 08/03/2016 CLINICAL DATA:  Cirrhosis. INDICATION: Cirrhosis.  Ascites. EXAM: LIMITED ABDOMEN ULTRASOUND FOR ASCITES ULTRASOUND GUIDED RIGHT PARACENTESIS TECHNIQUE: Limited ultrasound survey for ascites was performed in all four abdominal quadrants. COMPARISON:  None. PROCEDURE: Following this, a Yueh catheter was introduced. An ultrasound image was saved for documentation purposes. The paracentesis was performed. The catheter was removed and a dressing was applied. The patient tolerated the procedure well without immediate post procedural complication. FINDINGS: A total of approximately 4,000 cc of yellowish fluid was removed. Samples were sent to the laboratory as requested by the clinical team. IMPRESSION: Limited abdominal ultrasound demonstrates extensive ascites. Successful ultrasound-guided paracentesis yielding 4 liters of peritoneal fluid. Electronically Signed   By: Francene Boyers M.D.   On: 08/03/2016 15:30   Dg Shoulder Left  Result Date:  08/01/2016 CLINICAL DATA:  Increasing LEFT shoulder pain since last Saturday EXAM: LEFT SHOULDER - 2+ VIEW COMPARISON:  None FINDINGS: Diffuse osseous demineralization. AC joint alignment normal. No acute fracture, dislocation, or bone destruction. IMPRESSION: No acute osseous abnormalities. Electronically Signed   By: Ulyses Southward M.D.   On: 08/01/2016 13:21    Micro Results     Recent Results (from the past 240 hour(s))  Blood culture (routine x 2)     Status: None  Collection Time: 08/09/16  1:19 PM  Result Value Ref Range Status   Specimen Description LEFT ANTECUBITAL  Final   Special Requests BOTTLES DRAWN AEROBIC AND ANAEROBIC 4CC EACH  Final   Culture NO GROWTH 5 DAYS  Final   Report Status 08/14/2016 FINAL  Final  Blood culture (routine x 2)     Status: None   Collection Time: 08/09/16  1:38 PM  Result Value Ref Range Status   Specimen Description BLOOD LEFT HAND  Final   Special Requests BOTTLES DRAWN AEROBIC AND ANAEROBIC [facility] EACH  Final   Culture NO GROWTH 5 DAYS  Final   Report Status 08/14/2016 FINAL  Final    Today   Subjective    Montez Cuda today has no headache,no chest abdominal pain,no new weakness tingling or numbness, feels much better wants to go home today.     Objective   Blood pressure 130/79, pulse 100, temperature 99.5 F (37.5 C), temperature source Oral, resp. rate 18, height 5\' 8"  (1.727 m), weight 107.5 kg (237 lb 1.6 oz), SpO2 97 %.   Intake/Output Summary (Last 24 hours) at 08/15/16 0915 Last data filed at 08/14/16 1100  Gross per 24 hour  Intake              290 ml  Output             1250 ml  Net             -960 ml    Exam Awake Alert, Oriented x 3, No new F.N deficits, Normal affect [place].AT,PERRAL Supple Neck,No JVD, No cervical lymphadenopathy appriciated.  Symmetrical Chest wall movement, Good air movement bilaterally, CTAB RRR,No Gallops,Rubs or new Murmurs, No Parasternal Heave +ve B.Sounds, Abd Soft, Non tender, No  organomegaly appriciated, No rebound -guarding or rigidity. No Cyanosis, Clubbing , Bilateral lower extremity swelling left more than right, erythema in both legs but no warmth, wearing UNNA boots   Data Review   CBC w Diff: Lab Results  Component Value Date   WBC 13.7 (H) 08/12/2016   HGB 11.1 (L) 08/12/2016   HCT 32.9 (L) 08/12/2016   PLT 146 (L) 08/12/2016   LYMPHOPCT 18 08/09/2016   MONOPCT 5 08/09/2016   EOSPCT 2 08/09/2016   BASOPCT 0 08/09/2016    CMP: Lab Results  Component Value Date   NA 130 (L) 08/14/2016   K 3.7 08/14/2016   CL 96 (L) 08/14/2016   CO2 25 08/14/2016   BUN 9 08/14/2016   CREATININE 0.87 08/14/2016   CREATININE 0.70 02/04/2016   PROT 6.3 (L) 08/10/2016   ALBUMIN 2.5 (L) 08/10/2016   BILITOT 4.7 (H) 08/10/2016   ALKPHOS 148 (H) 08/10/2016   AST 61 (H) 08/10/2016   ALT 47 08/10/2016  .   Total Time in preparing paper work, data evaluation and todays exam - 35 minutes  Leroy Sea M.D on 08/15/2016 at 9:15 AM  [facility]   Office  [phone]

## 2016-08-15 NOTE — Discharge Instructions (Signed)
Follow with Primary MD Kirstie PeriSHAH,ASHISH, MD in 7 days, keep your legs elevated.   Get CBC, CMP, 2 view Chest X ray checked  by Primary MD in 2-3 days ( we routinely change or add medications that can affect your baseline labs and fluid status, therefore we recommend that you get the mentioned basic workup next visit with your PCP, your PCP may decide not to get them or add new tests based on their clinical decision)   Activity: As tolerated with Full fall precautions use walker/cane & assistance as needed   Disposition Home     Diet:   Diet 2 gram sodium   Check your Weight same time everyday, if you gain over 2 pounds, or you develop in leg swelling, experience more shortness of breath or chest pain, call your Primary MD immediately. Follow Cardiac Low Salt Diet and 1.2 lit/day fluid restriction.   On your next visit with your primary care physician please Get Medicines reviewed and adjusted.   Please request your Prim.MD to go over all Hospital Tests and Procedure/Radiological results at the follow up, please get all Hospital records sent to your Prim MD by signing hospital release before you go home.   If you experience worsening of your admission symptoms, develop shortness of breath, life threatening emergency, suicidal or homicidal thoughts you must seek medical attention immediately by calling 911 or calling your MD immediately  if symptoms less severe.  You Must read complete instructions/literature along with all the possible adverse reactions/side effects for all the Medicines you take and that have been prescribed to you. Take any new Medicines after you have completely understood and accpet all the possible adverse reactions/side effects.   Do not drive, operate heavy machinery, perform activities at heights, swimming or participation in water activities or provide baby sitting services if your were admitted for syncope or siezures until you have seen by Primary MD or a Neurologist and  advised to do so again.  Do not drive when taking Pain medications.    Do not take more than prescribed Pain, Sleep and Anxiety Medications  Special Instructions: If you have smoked or chewed Tobacco  in the last 2 yrs please stop smoking, stop any regular Alcohol  and or any Recreational drug use.  Wear Seat belts while driving.   Please note  You were cared for by a hospitalist during your hospital stay. If you have any questions about your discharge medications or the care you received while you were in the hospital after you are discharged, you can call the unit and asked to speak with the hospitalist on call if the hospitalist that took care of you is not available. Once you are discharged, your primary care physician will handle any further medical issues. Please note that NO REFILLS for any discharge medications will be authorized once you are discharged, as it is imperative that you return to your primary care physician (or establish a relationship with a primary care physician if you do not have one) for your aftercare needs so that they can reassess your need for medications and monitor your lab values.

## 2016-08-15 NOTE — Progress Notes (Signed)
Patient discharged home.  IV removed - WNL.  Reviewed DC instructions, medications, and follow up appointments.  Educated on complete alcohol cessation, importance of taking medications and weighing self at home.  Verbalized understanding.  Also encouraged to keep legs elevated.  Waiting arrival of ride to go home.  In NAD at this time.

## 2016-08-17 ENCOUNTER — Telehealth: Payer: Self-pay | Admitting: Gastroenterology

## 2016-08-17 ENCOUNTER — Encounter: Payer: Self-pay | Admitting: Gastroenterology

## 2016-08-17 NOTE — Telephone Encounter (Signed)
Please have patient see me next week for a follow-up visit. May put in an urgent slot. (Preferably a Wednesday).   Also, patient's sister was asking about a PCP with [facility] (Dr. Delton SeeNelson).

## 2016-08-18 ENCOUNTER — Ambulatory Visit (INDEPENDENT_AMBULATORY_CARE_PROVIDER_SITE_OTHER): Payer: Self-pay | Admitting: Gastroenterology

## 2016-08-18 ENCOUNTER — Encounter: Payer: Self-pay | Admitting: Gastroenterology

## 2016-08-18 VITALS — BP 124/82 | HR 82 | Temp 97.7°F | Ht 68.0 in | Wt 220.6 lb

## 2016-08-18 DIAGNOSIS — K7031 Alcoholic cirrhosis of liver with ascites: Secondary | ICD-10-CM

## 2016-08-18 NOTE — Patient Instructions (Signed)
Please have blood work done today.   Restart taking the inderal 20 mg each morning.   I will see you back in 4 weeks!

## 2016-08-18 NOTE — Progress Notes (Signed)
Primary Care Physician:  N/A as of this visit. Attempting to establish care Primary GI: Dr. Jena Gaussourk   Chief Complaint  Patient presents with  . Hepatitis    alcoholic, f/u hospital  . Abdominal Pain    mid to lower area    HPI:   Joseph Cortez is a 50 y.o. male presenting today with a history of alcoholic cirrhosis, recently treated for alcoholic hepatitis, just discharged recently due to LLE cellulitis that worsened despite outpatient treatment. Presents today in hospital follow-up.   Will be completing doxycycline, which he was discharged with. Just able to pick up yesterday. Will need 5 days total. No ETOH use since Oct 18th. Periumbilical discomfort today. Feels different than before. Feels like a knife, sharper than before. Took 2 doses of lactulose today and had BM 3-4 times. Xifaxan has been attempted in the past but unable to complete paperwork for patient assistance. Will need to try this again. Has been on inderal in the past but stopped due to hypotension. BP now normal and HR in the low 80s. Unable to tolerate BID dosing so will resume at once daily and titrate as appropriate. Next EGD due again in May 2018. No prior colonoscopy.   Aldactone 100 BID and lasix 40 BID. Will sometimes have pain in big toe. Continues to note left shoulder pain. Doesn't feel like abdomen is swollen currently.    Past Medical History:  Diagnosis Date  . Anxiety   . Cirrhosis (HCC)   . Depression   . Neuromuscular disorder (HCC)    Neuropathy  . Neuropathy [facility](HCC)     Past Surgical History:  Procedure Laterality Date  . BIOPSY  06/27/2015   Procedure: BIOPSY (Gastric);  Surgeon: Corbin Adeobert M Rourk, MD;  Location: [facility] ORS;  Service: Endoscopy;;  . CHOLECYSTECTOMY    . ESOPHAGEAL BANDING N/A 01/30/2016   Procedure: ESOPHAGEAL BANDING;  Surgeon: Corbin Adeobert M Rourk, MD;  Location: [facility] ENDO SUITE;  Service: Endoscopy;  Laterality: N/A;  . ESOPHAGOGASTRODUODENOSCOPY  January 06, 2016   [facility]: duodenitis in bullb, portal gastropathy, 2 columns of large distal esophageal varices, one with flat red spot, s/p band ligation. Needs 4 week surveillance EGD   . ESOPHAGOGASTRODUODENOSCOPY (EGD) WITH PROPOFOL N/A 06/27/2015   [id number]:mild erosive reflux esophagitis, portal gastropathy, duodenal bulbar ulcer, negative H.pylori. Surveillance due yearly according to ASGE current guidelines regarding ETOH cirrhosis and ongoing ETOH abuse  . ESOPHAGOGASTRODUODENOSCOPY (EGD) WITH PROPOFOL N/A 01/30/2016   Dr. Jena Gaussourk: Grade 2 varices s/p banding with complete deflation of varices, portal gastropathy, surveillance in May 2018    Current Outpatient Prescriptions  Medication Sig Dispense Refill  . doxycycline (VIBRA-TABS) 100 MG tablet Take 1 tablet (100 mg total) by mouth every 12 (twelve) hours. 10 tablet 0  . folic acid (FOLVITE) 1 MG tablet Take 1 tablet (1 mg total) by mouth daily. Reported on 03/12/2016 30 tablet 5  . furosemide (LASIX) 40 MG tablet Take 1 tablet (40 mg total) by mouth 2 (two) times daily. 30 tablet 0  . gabapentin (NEURONTIN) 300 MG capsule Take 300-600 mg by mouth 2 (two) times daily. 300mg  in the morning, 300mg  at 4pm and 600mg  at bedtime    . lactulose (CHRONULAC) 10 GM/15ML solution Take 30 mLs (20 g total) by mouth 2 (two) times daily. (Patient taking differently: Take 10 g by mouth as needed. ) 1892 mL 5  . Multiple Vitamin (MULTIVITAMIN WITH MINERALS) TABS tablet Take 1 tablet by  mouth daily.    . pantoprazole (PROTONIX) 40 MG tablet Take 1 tablet (40 mg total) by mouth 2 (two) times daily before a meal. 60 tablet 3  . potassium chloride SA (K-DUR,KLOR-CON) 20 MEQ tablet Take 1 tablet (20 mEq total) by mouth 2 (two) times daily. 60 tablet 3  . spironolactone (ALDACTONE) 50 MG tablet Take 1 tablet (50 mg total) by mouth daily. (Patient taking differently: Take 100 mg by mouth 2 (two) times daily. ) 90 tablet 3  . thiamine 100 MG tablet Take 100 mg by mouth daily.      No  current facility-administered medications for this visit.     Allergies as of 08/18/2016 - Review Complete 08/18/2016  Allergen Reaction Noted  . Sertraline Other (See Comments) 01/07/2016    Family History  Problem Relation Age of Onset  . Colon cancer Maternal Uncle     Social History   Social History  . Marital status: Divorced    Spouse name: N/A  . Number of children: N/A  . Years of education: N/A   Social History Main Topics  . Smoking status: Current Every Day Smoker    Types: Cigarettes  . Smokeless tobacco: Never Used     Comment: 3-4 cigarettes daily   . Alcohol use No     Comment: 1/5th bottle wine every day; stopped drinking October 18th   . Drug use: No     Comment: cocaine historically, several years ago  . Sexual activity: Yes    Birth control/ protection: None   Other Topics Concern  . None   Social History Narrative  . None    Review of Systems: As mentioned in HPI   Physical Exam: BP 124/82   Pulse 82   Temp 97.7 F (36.5 C) (Oral)   Ht 5\' 8"  (1.727 m)   Wt 220 lb 9.6 oz (100.1 kg)   BMI 33.54 kg/m  General:   Alert and oriented. No distress noted. Pleasant and cooperative. Appears in much better spirits today. Smiling more.  Head:  Normocephalic and atraumatic. Eyes:  Conjuctiva clear with very mild icterus, much improved from prior exam.  Mouth:  Oral mucosa pink and moist.  Abdomen:  +BS, soft, mild TTP periumbilically, distended but without tense ascites.  Extremities:  LLE with resolving erythema, more edematous than right. RLE with mild erythema.  Neurologic:  Alert and  oriented x4 Psych:  Alert and cooperative. Normal mood and affect.  Lab Results  Component Value Date   WBC 13.7 (H) 08/12/2016   HGB 11.1 (L) 08/12/2016   HCT 32.9 (L) 08/12/2016   MCV 101.5 (H) 08/12/2016   PLT 146 (L) 08/12/2016   Lab Results  Component Value Date   ALT 47 08/10/2016   AST 61 (H) 08/10/2016   ALKPHOS 148 (H) 08/10/2016   BILITOT 4.7  (H) 08/10/2016   Lab Results  Component Value Date   CREATININE 0.87 08/14/2016   BUN 9 08/14/2016   NA 130 (L) 08/14/2016   K 3.7 08/14/2016   CL 96 (L) 08/14/2016   CO2 25 08/14/2016

## 2016-08-18 NOTE — Assessment & Plan Note (Signed)
50 year old male with ETOH cirrhosis and recently hospitalized for ETOH hepatitis, prednisolone therapy weaned off now. [facility] extremity cellulitis that did not improve with outpatient antibiotics. Overall, I am quite impressed with how well he looks today. He seems to be in better spirits, laughing at times, and continues to abstain from ETOH since October 18th. Lower extremity edema improving and no signs of tense ascites on exam.   1. Continue Lasix 40 BID and aldactone 100 BID 2. Resume inderal 20 mg once daily. May be able to increase to BID but highly doubt this due to inability to tolerate in the past.  3. Continue lactulose. Will attempt to have Xifaxan covered with patient assistance in order to provide optimal therapy for him.  4. Hopeful appointment with Dr. Delton SeeNelson in the near future to establish PCP care. She has graciously agreed to see him, and I greatly appreciate it as he has primary care concerns that I am unable to address.  5. Next EGD in May 2018 6. [facility] 7. Check CBC, CMP, iron studies today. I have added on ceruloplasmin and alpha-1 antitrypsin as I note this is not in our database. I doubt he has Wilson's or alpha-1 antitrypsin deficiency, but I discussed that this needs to be evaluated per guidelines. His LFTs have historically mirrored those of ETOH abuse, and I anticipate this will be negative. He has had prior serologies to include autoimmune/PBC that are negative.  8. Return December 20th to see me.

## 2016-08-19 ENCOUNTER — Ambulatory Visit: Payer: Medicaid - Out of State | Admitting: Gastroenterology

## 2016-08-19 LAB — COMPLETE METABOLIC PANEL WITHOUT GFR
ALT: 25 U/L (ref 9–46)
AST: 59 U/L — ABNORMAL HIGH (ref 10–40)
Albumin: 3.2 g/dL — ABNORMAL LOW (ref 3.6–5.1)
Alkaline Phosphatase: 173 U/L — ABNORMAL HIGH (ref 40–115)
BUN: 7 mg/dL (ref 7–25)
CO2: 22 mmol/L (ref 20–31)
Calcium: 9 mg/dL (ref 8.6–10.3)
Chloride: 100 mmol/L (ref 98–110)
Creat: 0.63 mg/dL (ref 0.60–1.35)
GFR, Est African American: >89 mL/min
GFR, Est Non African American: >89 mL/min
Glucose, Bld: 118 mg/dL — ABNORMAL HIGH (ref 65–99)
Potassium: 4.2 mmol/L (ref 3.5–5.3)
Sodium: 133 mmol/L — ABNORMAL LOW (ref 135–146)
Total Bilirubin: 4.1 mg/dL — ABNORMAL HIGH (ref 0.2–1.2)
Total Protein: 7 g/dL (ref 6.1–8.1)

## 2016-08-19 LAB — CBC
HCT: 36.6 % — ABNORMAL LOW (ref 38.5–50.0)
Hemoglobin: 12.4 g/dL — ABNORMAL LOW (ref 13.2–17.1)
MCH: 34 pg — ABNORMAL HIGH (ref 27.0–33.0)
MCHC: 33.9 g/dL (ref 32.0–36.0)
MCV: 100.3 fL — ABNORMAL HIGH (ref 80.0–100.0)
MPV: 9.4 fL (ref 7.5–12.5)
Platelets: 199 K/uL (ref 140–400)
RBC: 3.65 MIL/uL — ABNORMAL LOW (ref 4.20–5.80)
RDW: 14.4 % (ref 11.0–15.0)
WBC: 8.8 K/uL (ref 3.8–10.8)

## 2016-08-19 LAB — IRON AND TIBC
%SAT: 25 % (ref 15–60)
Iron: 66 ug/dL (ref 50–180)
TIBC: 267 ug/dL (ref 250–425)
UIBC: 201 ug/dL (ref 125–400)

## 2016-08-19 LAB — FERRITIN: Ferritin: 464 ng/mL — ABNORMAL HIGH (ref 20–380)

## 2016-08-19 NOTE — Progress Notes (Signed)
CC'ED TO PCP 

## 2016-08-24 LAB — ALPHA-1 ANTITRYPSIN PHENOTYPE: A1 ANTITRYPSIN: 356 mg/dL — AB (ref 83–199)

## 2016-08-24 LAB — CERULOPLASMIN: CERULOPLASMIN: 38 mg/dL — AB (ref 18–36)

## 2016-08-24 NOTE — Progress Notes (Signed)
Ferritin elevated likely secondary to underlying liver disease, and iron sats are 25%. Doubt iron overload but ferritin is more of a bystander in this scenario. Sodium improved to 133, consistent with overall baseline. Renal function remains preserved on lasix 40 BID and aldactone 100 BID. We will continue this for now. Bilirubin improved from 2 weeks ago slightly and will take some time to return back to baseline. It looks like ceruloplasmin and alpha-1 are still pending,b ut that was awhile ago. Do we know why it's not resulting?

## 2016-08-26 ENCOUNTER — Ambulatory Visit: Payer: Medicaid - Out of State | Admitting: Family Medicine

## 2016-09-01 ENCOUNTER — Encounter: Payer: Self-pay | Admitting: Family Medicine

## 2016-09-01 ENCOUNTER — Ambulatory Visit (INDEPENDENT_AMBULATORY_CARE_PROVIDER_SITE_OTHER): Payer: Medicaid - Out of State | Admitting: Family Medicine

## 2016-09-01 VITALS — BP 104/78 | HR 86 | Temp 97.6°F | Resp 18 | Ht 68.0 in | Wt 217.1 lb

## 2016-09-01 DIAGNOSIS — M545 Low back pain, unspecified: Secondary | ICD-10-CM

## 2016-09-01 DIAGNOSIS — K7031 Alcoholic cirrhosis of liver with ascites: Secondary | ICD-10-CM | POA: Diagnosis not present

## 2016-09-01 DIAGNOSIS — G8929 Other chronic pain: Secondary | ICD-10-CM | POA: Diagnosis not present

## 2016-09-01 DIAGNOSIS — Z23 Encounter for immunization: Secondary | ICD-10-CM | POA: Diagnosis not present

## 2016-09-01 DIAGNOSIS — Z7689 Persons encountering health services in other specified circumstances: Secondary | ICD-10-CM

## 2016-09-01 DIAGNOSIS — H539 Unspecified visual disturbance: Secondary | ICD-10-CM | POA: Diagnosis not present

## 2016-09-01 DIAGNOSIS — F5104 Psychophysiologic insomnia: Secondary | ICD-10-CM

## 2016-09-01 DIAGNOSIS — M47816 Spondylosis without myelopathy or radiculopathy, lumbar region: Secondary | ICD-10-CM | POA: Diagnosis not present

## 2016-09-01 NOTE — Patient Instructions (Addendum)
I have referred for eye exam I have referred to nutrition Needs vaccinations today  Walk every day that you are able  I will find out about safe medicine for sleep and back pain  See me in 3-4 weeks

## 2016-09-01 NOTE — Progress Notes (Signed)
Chief Complaint  Patient presents with  . Establish Care   First visit for Joseph Cortez Here with his sister Joseph Cortez who lives nearby and checks on him several times a day  He is an alcoholic who is currently not drinking and going to AA 3 times a week , no alcohol for almost 8 weeks "this time".  Has tried on multiple occasions prior to quit.  He has cirrhosis and esophageal varices as a result of his alcohol abuse.  He is compliant with care provided by gastroenterology and keeps regular appts there I have discussed the multiple health risks associated with cigarette smoking including, but not limited to, cardiovascular disease, lung disease and cancer.  I have strongly recommended that smoking be stopped.  I have reviewed the various methods of quitting including cold [place], classes, nicotine replacements and prescription medications.  I have offered assistance in this difficult process.  The patient is not interested in assistance at this time. He has not seen an eye doctor in years and gives unusual history of a vision disturbance that was treated with klonopin.  An Eye evaluation is recommended. He has been told he needs to be on a modified diet but does not know what exactly he can eat.  Is referred to nutrition. He had a flu shot this year with unknown tdap or prevnar.  These are updated.  No regular exercise  He has complaints of chronic back pain and insomnia and wants to know what he can take.  I am not comfortable with acetaminophen, or NSAIDS or any controlled substances - this poses a real challenge.  He may be able to take limited doses or infrequent medication.  May need PT or other physical measures. He is on gabapentin.  Refuses all antidepressants.  Most muscle relaxers have some mood alteration and I do not feel muscle spasm sounds liek the problem.  He is very inactive and needs to get out of his house and move daily.    Patient Active Problem List   Diagnosis Date Noted  .  Chronic midline low back pain without sciatica 09/01/2016  . Chronic insomnia 09/01/2016  . 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 Localized swelling of lower extremity 08/09/2016  . Cellulitis 08/09/2016  . Cellulitis of leg, left 08/04/2016  . Alcoholic hepatitis with ascites 07/14/2016  . AKI (acute kidney injury) (HCC) 07/14/2016  . Hyponatremia 07/14/2016  . Hypokalemia 07/14/2016  . Alcohol abuse 07/14/2016  . Anxiety 07/14/2016  . Coagulopathy (HCC) 07/14/2016  . Thrombocytopenia (HCC) 07/14/2016  . Elevated LFTs   . Esophageal varices without bleeding (HCC)   . Portal hypertensive gastropathy   . Duodenal ulcer   . Reflux esophagitis   . Hepatic cirrhosis (HCC) 06/10/2015    Outpatient Encounter Prescriptions as of 09/01/2016  Medication Sig  . folic acid (FOLVITE) 1 MG tablet Take 1 tablet (1 mg total) by mouth daily. Reported on 03/12/2016  . furosemide (LASIX) 40 MG tablet Take 1 tablet (40 mg total) by mouth 2 (two) times daily.  Marland Kitchen. gabapentin (NEURONTIN) 300 MG capsule Take 300-600 mg by mouth 2 (two) times daily. 300mg  in the morning, 300mg  at 4pm and 600mg  at bedtime  . lactulose (CHRONULAC) 10 GM/15ML solution Take 30 mLs (20 g total) by mouth 2 (two) times daily. (Patient taking differently: Take 10 g by mouth as needed. )  . Multiple Vitamin (MULTIVITAMIN WITH MINERALS) TABS tablet Take 1 tablet by mouth daily.  . pantoprazole (  PROTONIX) 40 MG tablet Take 1 tablet (40 mg total) by mouth 2 (two) times daily before a meal.  . potassium chloride SA (K-DUR,KLOR-CON) 20 MEQ tablet Take 1 tablet (20 mEq total) by mouth 2 (two) times daily.  Marland Kitchen spironolactone (ALDACTONE) 50 MG tablet Take 1 tablet (50 mg total) by mouth daily. (Patient taking differently: Take 100 mg by mouth 2 (two) times daily. )  . thiamine 100 MG tablet Take 100 mg by mouth daily.   . [DISCONTINUED] doxycycline (VIBRA-TABS) 100 MG tablet Take 1 tablet (100 mg total) by mouth every  12 (twelve) hours. (Patient not taking: Reported on 09/01/2016)   No facility-administered encounter medications on file as of 09/01/2016.     Past Medical History:  Diagnosis Date  . Anemia   . Anxiety   . Arthritis   . Blood transfusion without reported diagnosis   . Cirrhosis (HCC)   . COPD (chronic obstructive pulmonary disease) (HCC)   . Depression   . Neuromuscular disorder (HCC)    Neuropathy  . Neuropathy (HCC)   . Substance abuse   . Ulcer (HCC)    BLEEDING VARICES, ULCERATIONS    Past Surgical History:  Procedure Laterality Date  . BIOPSY  06/27/2015   Procedure: BIOPSY (Gastric);  Surgeon: Corbin Ade, MD;  Location: [facility] ORS;  Service: Endoscopy;;  . CHOLECYSTECTOMY    . ESOPHAGEAL BANDING N/A 01/30/2016   Procedure: ESOPHAGEAL BANDING;  Surgeon: Corbin Ade, MD;  Location: [facility] ENDO SU[facility];  Service: Endoscopy;  Laterality: N/A;  . ESOPHAGOGASTRODUODENOSCOPY  January 06, 2016   Dr. Alycia Rossetti at [facility]: duodenitis in bullb, portal gastropathy, 2 columns of large distal esophageal varices, one with flat red spot, s/p band ligation. Needs 4 week surveillance EGD   . ESOPHAGOGASTRODUODENOSCOPY (EGD) WITH PROPOFOL N/A 06/27/2015   ZOX:WRUE erosive reflux esophagitis, portal gastropathy, duodenal bulbar ulcer, negative H.pylori. Surveillance due yearly according to ASGE current guidelines regarding ETOH cirrhosis and ongoing ETOH abuse  . ESOPHAGOGASTRODUODENOSCOPY (EGD) WITH PROPOFOL N/A 01/30/2016   Dr. Jena Gauss: Grade 2 varices s/p banding with complete deflation of varices, portal gastropathy, surveillance in May 2018    Social History   Social History  . Marital status: Divorced    Spouse name: N/A  . Number of children: 2  . Years of education: 11   Occupational History  . disability     former truck driver   Social History Main Topics  . Smoking status: Current Every Day Smoker    Types: Cigarettes  . Smokeless tobacco: Never Used     Comment: 3-4 cigarettes daily     . Alcohol use No     Comment: 1/5th bottle wine every day; stopped drinking October 18th   . Drug use: No     Comment: cocaine historically, several years ago  . Sexual activity: Yes    Birth control/ protection: None   Other Topics Concern  . Not on file   Social History Narrative   Lives alone   Lives near sister   No reg exercise   Disabled for 3 years    Family History  Problem Relation Age of Onset  . Colon cancer Maternal Uncle   . Alcohol abuse Maternal Uncle   . Arthritis Mother   . Cancer Mother     breast cancer  . Hypertension Father   . Hyperlipidemia Father   . Lupus Sister   . Alcohol abuse Paternal Uncle   . Alzheimer's disease Maternal Grandmother   .  COPD Maternal Grandfather   . Diabetes Maternal Grandfather   . Heart disease Paternal Grandfather   . Alcohol abuse Cousin     Review of Systems  Constitutional: Positive for weight loss.  HENT: Positive for nosebleeds. Negative for hearing loss.   Eyes: Positive for blurred vision.       One eye is problematic at itmes  Respiratory: Negative for cough, sputum production and shortness of breath.   Cardiovascular: Positive for leg swelling. Negative for chest pain and palpitations.  Gastrointestinal: Positive for abdominal pain, diarrhea and heartburn.  Genitourinary: Negative for dysuria.  Musculoskeletal: Positive for back pain.  Skin:       Dry skin legs  Neurological: Positive for weakness. Negative for seizures and headaches.  Psychiatric/Behavioral: Positive for depression, memory loss and substance abuse. The patient is nervous/anxious and has insomnia.    BP 104/78 (BP Location: Left Arm, Patient Position: Sitting, Cuff Size: Normal)   Pulse 86   Temp 97.6 F (36.4 C) (Oral)   Resp 18   Ht 5\' 8"  (1.727 m)   Wt 217 lb 1.9 oz (98.5 kg)   SpO2 99%   BMI 33.01 kg/m   Physical Exam  Constitutional: He is oriented to person, place, and time. He appears well-developed and well-nourished.   Hesitant speech.  looks tired  HENT:  Head: Normocephalic and atraumatic.  Right Ear: External ear normal.  Left Ear: External ear normal.  Mouth/Throat: Oropharynx is clear and moist.  Eyes: Conjunctivae are normal. Pupils are equal, round, and reactive to light. Scleral icterus is present.  Neck: Normal range of motion. Neck supple. No thyromegaly present.  Cardiovascular: Normal rate, regular rhythm and normal heart sounds.   Pulmonary/Chest: Effort normal and breath sounds normal. No respiratory distress.  Abdominal: Soft. Bowel sounds are normal. He exhibits distension.  Musculoskeletal: Normal range of motion. He exhibits edema.  Right lower leg still swollen and slightly red from cellulitis  Lymphadenopathy:    He has no cervical adenopathy.  Neurological: He is alert and oriented to person, place, and time.  Gait normal  Skin: Skin is warm and dry. There is erythema.  Psychiatric: He has a normal mood and affect. His behavior is normal.  Nursing note and vitals reviewed.  ASSESSMENT/PLAN:   1. Alcoholic cirrhosis of liver with ascites (HCC)  - Amb ref to Medical Nutrition Therapy-MNT  2. Vision disturbance  - Ambulatory referral to Ophthalmology  3. Encounter to establish care with new doctor   4. Chronic insomnia   5. Chronic midline low back pain without sciatica   6. Spondylosis of lumbar region without myelopathy or radiculopathy    Patient Instructions  I have referred for eye exam I have referred to nutrition Needs vaccinations today  Walk every day that you are able  I will find out about safe medicine for sleep and back pain  See me in 3-4 weeks   Joseph MooreYvonne Sue Christiana Gurevich, MD

## 2016-09-07 ENCOUNTER — Encounter: Payer: Self-pay | Admitting: Family Medicine

## 2016-09-08 ENCOUNTER — Other Ambulatory Visit: Payer: Self-pay | Admitting: Family Medicine

## 2016-09-08 MED ORDER — TIZANIDINE HCL 4 MG PO TABS: 4.0000 mg | ORAL_TABLET | Freq: Three times a day (TID) | ORAL | 0 refills | Status: DC | PRN

## 2016-09-14 NOTE — Progress Notes (Signed)
Ceruloplasmin slightly elevated (and is an acute phase reactant. Would be low in Wilson's disease the majority of the time. Could consider 24 hour urine copper. Will discuss at next visit). Alpha-1 antitrypsin without evidence of deficiency and is elevated as well; this is an acute phase reactant and commonly elevated in ETOH hepatitis.

## 2016-09-16 ENCOUNTER — Ambulatory Visit: Payer: Medicaid - Out of State | Admitting: Gastroenterology

## 2016-09-24 ENCOUNTER — Ambulatory Visit: Payer: Medicaid - Out of State | Admitting: Family Medicine

## 2016-10-15 ENCOUNTER — Ambulatory Visit: Payer: Medicaid - Out of State | Admitting: Gastroenterology

## 2016-11-02 ENCOUNTER — Encounter (HOSPITAL_COMMUNITY): Payer: Self-pay

## 2016-11-02 ENCOUNTER — Ambulatory Visit (HOSPITAL_COMMUNITY)
Admission: RE | Admit: 2016-11-02 | Discharge: 2016-11-02 | Disposition: A | Discharge status: Home / Self Care | Payer: Self-pay | Source: Ambulatory Visit | Attending: Gastroenterology | Admitting: Gastroenterology

## 2016-11-02 DIAGNOSIS — R188 Other ascites: Secondary | ICD-10-CM | POA: Insufficient documentation

## 2016-11-02 DIAGNOSIS — K7460 Unspecified cirrhosis of liver: Secondary | ICD-10-CM | POA: Insufficient documentation

## 2016-11-02 LAB — GRAM STAIN: Gram Stain: NONE SEEN

## 2016-11-02 LAB — BODY FLUID CELL COUNT WITH DIFFERENTIAL
Eos, Fluid: 0 %
Lymphs, Fluid: 68 %
MONOCYTE-MACROPHAGE-SEROUS FLUID: 24 % — AB (ref 50–90)
Neutrophil Count, Fluid: 8 % (ref 0–25)
Other Cells, Fluid: 0 %
WBC FLUID: 238 uL (ref 0–1000)

## 2016-11-02 NOTE — Procedures (Signed)
PreOperative Dx: Cirrhosis, ascites °Postoperative Dx: Cirrhosis, ascites °Procedure:   US guided paracentesis °Radiologist:  Kemontae Dunklee °Anesthesia:  10 ml of1% lidocaine °Specimen:  4 L of yellow ascitic fluid °EBL:   < 1 ml °Complications: None   °

## 2016-11-02 NOTE — Progress Notes (Signed)
Paracentesis complete no signs of distress. 4L yellow colored ascites removed.  

## 2016-11-05 ENCOUNTER — Ambulatory Visit: Payer: Medicaid - Out of State | Admitting: Gastroenterology

## 2016-11-06 ENCOUNTER — Other Ambulatory Visit: Payer: Self-pay

## 2016-11-06 ENCOUNTER — Ambulatory Visit (HOSPITAL_COMMUNITY)
Admission: RE | Admit: 2016-11-06 | Discharge: 2016-11-06 | Disposition: A | Discharge status: Home / Self Care | Payer: Medicaid - Out of State | Source: Ambulatory Visit | Attending: Gastroenterology | Admitting: Gastroenterology

## 2016-11-06 ENCOUNTER — Telehealth: Payer: Self-pay

## 2016-11-06 DIAGNOSIS — K7031 Alcoholic cirrhosis of liver with ascites: Secondary | ICD-10-CM | POA: Insufficient documentation

## 2016-11-06 NOTE — Progress Notes (Signed)
Paracentesis complete no signs of distress.  

## 2016-11-06 NOTE — Telephone Encounter (Signed)
He can have a para with 25 g Albumin IV at 4 liters. No fluid analysis needed. May be standing order.

## 2016-11-06 NOTE — Procedures (Signed)
PreOperative Dx: Cirrhosis, ascites Postoperative Dx: Cirrhosis, ascites Procedure:   US guided paracentesis Radiologist:  Darrio Bade Anesthesia:  10 ml of1% lidocaine Specimen:  6 L of yellow ascitic fluid EBL:   < 1 ml Complications: None  

## 2016-11-06 NOTE — Telephone Encounter (Signed)
Pt is set up for PARA on 11/10/16 @ 1100 am. He is aware

## 2016-11-06 NOTE — Telephone Encounter (Signed)
Pt is calling to see about having a PARA done. Please advise

## 2016-11-10 ENCOUNTER — Ambulatory Visit (HOSPITAL_COMMUNITY): Payer: Medicaid - Out of State

## 2016-11-10 LAB — CULTURE, BODY FLUID W GRAM STAIN -BOTTLE: Culture: NO GROWTH

## 2016-11-10 LAB — CULTURE, BODY FLUID-BOTTLE

## 2016-11-11 ENCOUNTER — Telehealth: Payer: Self-pay | Admitting: Gastroenterology

## 2016-11-11 ENCOUNTER — Other Ambulatory Visit: Payer: Self-pay

## 2016-11-11 DIAGNOSIS — R188 Other ascites: Secondary | ICD-10-CM

## 2016-11-11 NOTE — Telephone Encounter (Signed)
I was contacted by patient's sister, Unknown FoleyCathy Cortez. She states Joseph NovemberMike needs another paracentesis and asked if we had a standing order, which we do. However, I let her know that if he ever needs one, to call our office so that it may be arranged with radiology.   Standing orders for US para with 25 g IV Albumin at 4 liters. Orders have been placed. Patient has appt with me on 2/23 at 9am.

## 2016-11-12 ENCOUNTER — Telehealth: Payer: Self-pay

## 2016-11-12 NOTE — Telephone Encounter (Signed)
Dr. Crista Elliotenendio would like for you to cal her at [phone]. She is wanting to know how you feel about starting him on Nelprexone. She also would like for us to fax his last liver labs to her at [phone]. Please advise.

## 2016-11-13 ENCOUNTER — Ambulatory Visit (HOSPITAL_COMMUNITY)
Admission: RE | Admit: 2016-11-13 | Discharge: 2016-11-13 | Disposition: A | Discharge status: Home / Self Care | Payer: Medicaid - Out of State | Source: Ambulatory Visit | Attending: Gastroenterology | Admitting: Gastroenterology

## 2016-11-13 ENCOUNTER — Encounter (HOSPITAL_COMMUNITY): Payer: Self-pay

## 2016-11-13 DIAGNOSIS — R188 Other ascites: Secondary | ICD-10-CM | POA: Insufficient documentation

## 2016-11-13 NOTE — Progress Notes (Signed)
Paracentesis complete no signs of distress. 5.8L yellow colored ascites removed.  

## 2016-11-18 NOTE — Telephone Encounter (Signed)
Attempted to call but unable to reach. Unclear if this is the right number.

## 2016-11-20 ENCOUNTER — Encounter: Payer: Self-pay | Admitting: Family Medicine

## 2016-11-20 ENCOUNTER — Encounter: Payer: Self-pay | Admitting: Gastroenterology

## 2016-11-20 ENCOUNTER — Ambulatory Visit (INDEPENDENT_AMBULATORY_CARE_PROVIDER_SITE_OTHER): Payer: Medicaid - Out of State | Admitting: Family Medicine

## 2016-11-20 ENCOUNTER — Other Ambulatory Visit: Payer: Self-pay | Admitting: Family Medicine

## 2016-11-20 ENCOUNTER — Ambulatory Visit (INDEPENDENT_AMBULATORY_CARE_PROVIDER_SITE_OTHER): Payer: Medicaid - Out of State | Admitting: Gastroenterology

## 2016-11-20 ENCOUNTER — Encounter (HOSPITAL_COMMUNITY): Payer: Self-pay

## 2016-11-20 ENCOUNTER — Other Ambulatory Visit (HOSPITAL_COMMUNITY)
Admission: RE | Admit: 2016-11-20 | Discharge: 2016-11-20 | Disposition: A | Discharge status: Home / Self Care | Payer: Medicaid - Out of State | Source: Ambulatory Visit | Attending: Gastroenterology | Admitting: Gastroenterology

## 2016-11-20 ENCOUNTER — Ambulatory Visit (HOSPITAL_COMMUNITY)
Admission: RE | Admit: 2016-11-20 | Discharge: 2016-11-20 | Disposition: A | Discharge status: Home / Self Care | Payer: Medicaid - Out of State | Source: Ambulatory Visit | Attending: Gastroenterology | Admitting: Gastroenterology

## 2016-11-20 VITALS — BP 110/68 | HR 80 | Temp 97.7°F | Resp 18 | Ht 68.0 in | Wt 212.0 lb

## 2016-11-20 VITALS — BP 113/77 | HR 84 | Temp 97.6°F | Ht 68.0 in | Wt 211.0 lb

## 2016-11-20 DIAGNOSIS — Z720 Tobacco use: Secondary | ICD-10-CM

## 2016-11-20 DIAGNOSIS — R1030 Lower abdominal pain, unspecified: Secondary | ICD-10-CM | POA: Insufficient documentation

## 2016-11-20 DIAGNOSIS — F1010 Alcohol abuse, uncomplicated: Secondary | ICD-10-CM | POA: Diagnosis not present

## 2016-11-20 DIAGNOSIS — R110 Nausea: Secondary | ICD-10-CM

## 2016-11-20 DIAGNOSIS — K7031 Alcoholic cirrhosis of liver with ascites: Secondary | ICD-10-CM | POA: Insufficient documentation

## 2016-11-20 DIAGNOSIS — I868 Varicose veins of other specified sites: Secondary | ICD-10-CM | POA: Insufficient documentation

## 2016-11-20 DIAGNOSIS — R109 Unspecified abdominal pain: Secondary | ICD-10-CM | POA: Insufficient documentation

## 2016-11-20 DIAGNOSIS — R197 Diarrhea, unspecified: Secondary | ICD-10-CM | POA: Insufficient documentation

## 2016-11-20 DIAGNOSIS — K639 Disease of intestine, unspecified: Secondary | ICD-10-CM | POA: Insufficient documentation

## 2016-11-20 DIAGNOSIS — M47896 Other spondylosis, lumbar region: Secondary | ICD-10-CM

## 2016-11-20 LAB — CBC
HEMATOCRIT: 43.5 % (ref 39.0–52.0)
HEMOGLOBIN: 15.5 g/dL (ref 13.0–17.0)
MCH: 33.1 pg (ref 26.0–34.0)
MCHC: 35.6 g/dL (ref 30.0–36.0)
MCV: 92.9 fL (ref 78.0–100.0)
Platelets: 233 10*3/uL (ref 150–400)
RBC: 4.68 MIL/uL (ref 4.22–5.81)
RDW: 15.9 % — ABNORMAL HIGH (ref 11.5–15.5)
WBC: 10.5 10*3/uL (ref 4.0–10.5)

## 2016-11-20 LAB — COMPREHENSIVE METABOLIC PANEL
ALBUMIN: 3.3 g/dL — AB (ref 3.5–5.0)
ALT: 31 U/L (ref 17–63)
AST: 85 U/L — AB (ref 15–41)
Alkaline Phosphatase: 142 U/L — ABNORMAL HIGH (ref 38–126)
Anion gap: 13 (ref 5–15)
CHLORIDE: 98 mmol/L — AB (ref 101–111)
CO2: 23 mmol/L (ref 22–32)
CREATININE: 0.63 mg/dL (ref 0.61–1.24)
Calcium: 9 mg/dL (ref 8.9–10.3)
GFR calc Af Amer: >60 mL/min (ref >60)
GFR calc non Af Amer: >60 mL/min (ref >60)
GLUCOSE: 119 mg/dL — AB (ref 65–99)
Potassium: 3.5 mmol/L (ref 3.5–5.1)
SODIUM: 134 mmol/L — AB (ref 135–145)
Total Bilirubin: 4.5 mg/dL — ABNORMAL HIGH (ref 0.3–1.2)
Total Protein: 7.9 g/dL (ref 6.5–8.1)

## 2016-11-20 LAB — PROTIME-INR
INR: 1.4
PROTHROMBIN TIME: 17.3 s — AB (ref 11.4–15.2)

## 2016-11-20 MED ORDER — IOPAMIDOL (ISOVUE-300) INJECTION 61%
INTRAVENOUS | Status: AC
  Administered 2016-11-20: 30 mL via ORAL
  Filled 2016-11-20: qty 30

## 2016-11-20 MED ORDER — ONDANSETRON HCL 4 MG/2ML IJ SOLN
4.0000 mg | Freq: Once | INTRAMUSCULAR | Status: AC
  Administered 2016-11-20: 4 mg via INTRAMUSCULAR

## 2016-11-20 MED ORDER — IOPAMIDOL (ISOVUE-300) INJECTION 61%
100.0000 mL | Freq: Once | INTRAVENOUS | Status: AC | PRN
  Administered 2016-11-20: 100 mL via INTRAVENOUS

## 2016-11-20 NOTE — Progress Notes (Signed)
Although LFTs appear similar to past (some increase in bilirubin noted), creatinine is normal, no anemia, he appears to be rapidly declining. I have called in Zofran to take as needed for nausea. CT with no acute findings. Will add on fluid analysis for next paracentesis, which will likely be next week. MELD Na is 20. He is not a candidate for a liver transplant due to recent ETOH abuse (only about a month sober). Difficult situation. Awaiting stool studies, as he has new onset diarrhea. I will see if the transitions care team (who has been assessing him monthly) can reassess now and see hospice candidacy. As of last visit in Nov, he was not a candidate, but his situation is changed. If any worsening of symptoms, fever, changes in mental status, he is to seek medical care immediately. His sister is actively involved in his care and understands.   Ginger/Martina: can we add fluid analysis for next paracentesis? Need cell count, culture, cytology.

## 2016-11-20 NOTE — Progress Notes (Signed)
Chief Complaint  Patient presents with  . Follow-up   Parish is here for follow-up. He had another relapse and is drinking about a month ago and was drinking heavily for 5 weeks. He's quit again now. Since February he has had a worsening of his ascites. He has had 3 separate paracentesis procedures with approximately 5 L each time so far this month. He is due for another paracentesis. He is here with his sister. He hasn't eaten for 2 days. He is drinking very little. He has cognitive impairment. His eyes are yellow. His abdomen is large. It appears his liver failure is acutely worse than when I last saw him. He saw his gastroenterologist today. She will get stat blood work, and a CAT scan. They're to be performed in the next hour or 2. Once we have this information we can plan more for his future. I suspect today he needs to be hospitalized for rehydration, paracentesis, and likely a palliative care consult. He has a history of cellulitis. He doesn't have any swelling or problems with his legs today. He has a history of tobacco abuse. He continues to smoke cigarettes. He has reduced. Even though he is not eating or drinking very much she has had an increase in his diarrhea. He hasn't taken his lactulose because of the diarrhea. His biggest acute complaint today is nausea. He feels like he is going to throw up in the office. He is given a shot of Zofran.  Patient Active Problem List   Diagnosis Date Noted  . Abdominal pain 11/20/2016  . Diarrhea 11/20/2016  . Tobacco abuse 11/20/2016  . Chronic midline low back pain without sciatica 09/01/2016  . Chronic insomnia 09/01/2016  . 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 Localized swelling of lower extremity 08/09/2016  . Cellulitis 08/09/2016  . Cellulitis of leg, left 08/04/2016  . Alcoholic hepatitis with ascites 07/14/2016  . AKI (acute kidney injury) (HCC) 07/14/2016  . Hyponatremia 07/14/2016  . Hypokalemia  07/14/2016  . Alcohol abuse 07/14/2016  . Anxiety 07/14/2016  . Coagulopathy (HCC) 07/14/2016  . Thrombocytopenia (HCC) 07/14/2016  . Elevated LFTs   . Esophageal varices without bleeding (HCC)   . Portal hypertensive gastropathy   . Duodenal ulcer   . Reflux esophagitis   . Hepatic cirrhosis (HCC) 06/10/2015    Outpatient Encounter Prescriptions as of 11/20/2016  Medication Sig  . folic acid (FOLVITE) 1 MG tablet Take 1 tablet (1 mg total) by mouth daily. Reported on 03/12/2016  . gabapentin (NEURONTIN) 300 MG capsule Take 300-600 mg by mouth 2 (two) times daily. 300mg  in the morning, 300mg  at 4pm and 600mg  at bedtime  . Multiple Vitamin (MULTIVITAMIN WITH MINERALS) TABS tablet Take 1 tablet by mouth daily.  . pantoprazole (PROTONIX) 40 MG tablet Take 1 tablet (40 mg total) by mouth 2 (two) times daily before a meal.  . potassium chloride SA (K-DUR,KLOR-CON) 20 MEQ tablet Take 1 tablet (20 mEq total) by mouth 2 (two) times daily.  Marland Kitchen thiamine 100 MG tablet Take 100 mg by mouth daily.   Marland Kitchen tiZANidine (ZANAFLEX) 4 MG tablet Take 1 tablet (4 mg total) by mouth every 8 (eight) hours as needed for muscle spasms.  . furosemide (LASIX) 40 MG tablet Take 40 mg by mouth 2 (two) times daily.  . [EXPIRED] ondansetron (ZOFRAN) injection 4 mg    No facility-administered encounter medications on file as of 11/20/2016.     Allergies  Allergen Reactions  .  Sertraline Other (See Comments)    Suicidal ideations    Review of Systems  Constitutional: Positive for activity change, appetite change and fatigue.  HENT: Positive for congestion and dental problem.   Eyes: Negative for visual disturbance.       Eyes are yellow  Respiratory: Negative for shortness of breath.   Cardiovascular: Positive for palpitations and leg swelling.       Occasional feels heart flutter.  Gastrointestinal: Positive for abdominal distention, abdominal pain, diarrhea and nausea. Negative for blood in stool and vomiting.    Genitourinary:       Urine less frequent, dark  Musculoskeletal: Positive for back pain.       Intermittent  Skin: Positive for color change.  Neurological: Negative for dizziness, seizures and headaches.  Psychiatric/Behavioral: Positive for agitation, behavioral problems, confusion and decreased concentration.       Fatigued, slow responses but easily irritated, confusion    BP 110/68 (BP Location: Right Arm, Patient Position: Sitting, Cuff Size: Normal)   Pulse 80   Temp 97.7 F (36.5 C) (Temporal)   Resp 18   Ht 5\' 8"  (1.727 m)   Wt 212 lb (96.2 kg)   SpO2 100%   BMI 32.23 kg/m   Physical Exam  Constitutional: He appears well-developed and well-nourished. He appears distressed.  Somnolent. Skin and nausea. Large protuberant abdomen.  HENT:  Head: Normocephalic and atraumatic.  Mouth slightly dry  Eyes: Conjunctivae are normal. Pupils are equal, round, and reactive to light. Scleral icterus is present.  Neck: Normal range of motion.  Cardiovascular: Normal rate and regular rhythm.   Pulmonary/Chest: Effort normal and breath sounds normal.  Abdominal: He exhibits distension.  Musculoskeletal: He exhibits edema.  Neurological: He is alert.  Psychiatric:  Somnolent. Irritable. Doesn't appear to understand some questions.    ASSESSMENT/PLAN:  1. Nausea  - ondansetron (ZOFRAN) injection 4 mg; Inject 2 mLs (4 mg total) into the muscle once.  2. Alcoholic cirrhosis of liver with ascites (HCC) I referred the patient has likely got into an acute liver failure. He needs systemic labs and CAT scan to determine his current status. I suspect his ammonia is high.  3. Other osteoarthritis of spine, lumbar region  4. Alcohol abuse  5. Tobacco abuse  Briefly discussed with his sister Olegario MessierKathy that this may be a worsening of condition that could lead to his demise. I advised her not to take him home after his testing today but keep him in the hospital until one of us could reach  her for determination. She'll either need to be taking him to her house for care or to be an inpatient. Greater than 50% of this visit was spent in counseling and coordinating care.  Total face to face time:   25 minutes  Patient Instructions  Go to hospital for testing   Eustace MooreYvonne Sue Suzanne Garbers, MD

## 2016-11-20 NOTE — Progress Notes (Signed)
Primary Care Physician:  Eustace MooreYvonne Sue Nelson, MD Primary GI: Dr. Jena Gaussourk    Chief Complaint  Patient presents with  . Abdominal Pain    HPI:   Joseph Cortez is a 51 y.o. male presenting today with a history of ETOH cirrhosis, ETOH hepatitis, LLE cellulitis requiring inpatient stay. Lactulose for hepatic encephalopathy and have continued to try and approve for patient assistance with Xifaxan.   EGD due in May 2018. No prior colonoscopy. US abdomen due in May 2018. Has had frequent paracentesis, 3 sinc Nov 02, 2016. Negative most recently for SBP. Historically has been on lasix 40 mg BID and aldactone 100 BID.   Presents today feeling fatigued. Joseph Cortez, his sister, feels he is more jaundiced than normal. Joseph Cortez notes changes to his trunk, consistent with spider angiomas.  Hasn't eaten since Wednesday, as he does not feel like it. Pain "on inside of abdomen", sometimes better after para. Feels like a knife inside. Worried about belly button, states he feels fluid around it. Doesn't feel like he needs a paracentesis today. No fever/chills. Confused at times. Stopped lasix/aldactone a few days ago as he felt it was not helping. Loose stools for at least 3-4 days, sometimes up to 5 a day. Has held lactulose for several days now. No rectal bleeding.    Past Medical History:  Diagnosis Date  . Anemia   . Anxiety   . Arthritis   . Blood transfusion without reported diagnosis   . Cirrhosis (HCC)   . COPD (chronic obstructive pulmonary disease) (HCC)   . Depression   . Neuromuscular disorder (HCC)    Neuropathy  . Neuropathy (HCC)   . Substance abuse   . Ulcer (HCC)    BLEEDING VARICES, ULCERATIONS    Past Surgical History:  Procedure Laterality Date  . BIOPSY  06/27/2015   Procedure: BIOPSY (Gastric);  Surgeon: Corbin Adeobert M Rourk, MD;  Location: [facility];  Service: Endoscopy;;  . CHOLECYSTECTOMY    . ESOPHAGEAL BANDING N/A 01/30/2016   Procedure: ESOPHAGEAL BANDING;  Surgeon: Corbin Adeobert M Rourk, MD;   Location: [facility] SU[facility];  Service: Endoscopy;  Laterality: N/A;  . ESOPHAGOGASTRODUODENOSCOPY  January 06, 2016   [facility]: duodenitis in bullb, portal gastropathy, 2 columns of large distal esophageal varices, one with flat red spot, s/p band ligation. Needs 4 week surveillance EGD   . ESOPHAGOGASTRODUODENOSCOPY (EGD) WITH PROPOFOL N/A 06/27/2015   [id number]:[id number]:mild erosive reflux esophagitis, portal gastropathy, duodenal bulbar ulcer, negative H.pylori. Surveillance due yearly according to ASGE current guidelines regarding ETOH cirrhosis and ongoing ETOH abuse  . ESOPHAGOGASTRODUODENOSCOPY (EGD) WITH PROPOFOL N/A 01/30/2016   Dr. Jena Gaussourk: Grade 2 varices s/p banding with complete deflation of varices, portal gastropathy, surveillance in May 2018    Current Outpatient Prescriptions  Medication Sig Dispense Refill  . folic acid (FOLVITE) 1 MG tablet Take 1 tablet (1 mg total) by mouth daily. Reported on 03/12/2016 30 tablet 5  . gabapentin (NEURONTIN) 300 MG capsule Take 300-600 mg by mouth 2 (two) times daily. 300mg  in the morning, 300mg  at 4pm and 600mg  at bedtime    . Multiple Vitamin (MULTIVITAMIN WITH MINERALS) TABS tablet Take 1 tablet by mouth daily.    . pantoprazole (PROTONIX) 40 MG tablet Take 1 tablet (40 mg total) by mouth 2 (two) times daily before a meal. 60 tablet 3  . potassium chloride SA (K-DUR,KLOR-CON) 20 MEQ tablet Take 1 tablet (20 mEq total) by mouth 2 (two) times daily. 60 tablet 3  .  thiamine 100 MG tablet Take 100 mg by mouth daily.     Marland Kitchen tiZANidine (ZANAFLEX) 4 MG tablet Take 1 tablet (4 mg total) by mouth every 8 (eight) hours as needed for muscle spasms. 30 tablet 0   No current facility-administered medications for this visit.     Allergies as of 11/20/2016 - Review Complete 11/20/2016  Allergen Reaction Noted  . Sertraline Other (See Comments) 01/07/2016    Family History  Problem Relation Age of Onset  . Colon cancer Maternal Uncle   . Alcohol abuse Maternal  Uncle   . Arthritis Mother   . Cancer Mother     breast cancer  . Hypertension Father   . Hyperlipidemia Father   . Lupus Sister   . Alcohol abuse Paternal Uncle   . Alzheimer's disease Maternal Grandmother   . COPD Maternal Grandfather   . Diabetes Maternal Grandfather   . Heart disease Paternal Grandfather   . Alcohol abuse Cousin     Social History   Social History  . Marital status: Divorced    Spouse name: N/A  . Number of children: 2  . Years of education: 11   Occupational History  . disability     former truck driver   Social History Main Topics  . Smoking status: Current Every Day Smoker    Types: Cigarettes  . Smokeless tobacco: Never Used     Comment: 3-4 cigarettes daily   . Alcohol use No     Comment: 1/5th bottle wine every day; stopped drinking October 18th   . Drug use: No     Comment: cocaine historically, several years ago  . Sexual activity: Yes    Birth control/ protection: None   Other Topics Concern  . None   Social History Narrative   Lives alone   Lives near sister   No reg exercise   Disabled for 3 years    Review of Systems: As mentioned in HPI   Physical Exam: BP 113/77   Pulse 84   Temp 97.6 F (36.4 C) (Oral)   Ht 5\' 8"  (1.727 m)   Wt 211 lb (95.7 kg)   BMI 32.08 kg/m  General:   Alert and oriented. Appears tired. Face drawn.  Eyes:  +scleral icterus  Abdomen:  +BS, mild to moderately tense ascites, umbilical hernia, no significant TTP. No rebound or guarding Extremities:  Without edema, palmar erythema noted bilaterally Neurologic:  Alert and  oriented x4; negative asterixis Skin:  Spider angiomas across trunk  Psych:  Alert and cooperative. Flat affect but easily converses

## 2016-11-20 NOTE — Progress Notes (Signed)
Spoke with Joseph LoronAnna Cortez and she said to send the patient on home and she would text his sister. I took the IV out of his arm and sent him home. I told the sister that [facility]./ tsf

## 2016-11-20 NOTE — Assessment & Plan Note (Signed)
No ETOH in about a month, which is encouraging. Dr. Steffanie DunneNunzio [phone]) requesting our input regarding Naltrexone due to history of ETOH abuse. I do not feel comfortable with this due to his decline and decompensation. I discussed this with [name] and Kathlene NovemberMike today. Lower extremity edema resolved but continues with frequent paracenteses. Negative SBP recently. Abdominal discomfort chronic but worse today in setting of diarrhea, fatigue. CT ordered. Need to rule out PVT, other etiology contributing to persistent accumulation of ascites despite diuretic therapy and dietary behavior modifications. CBC, CMP, INR stat today. CT stat. Ordered stool studies to further evaluate for infectious process due to new onset diarrhea. Lactulose on hold for now. Still attempting to have Xifaxan covered. EGD in May 2018. Further recommendations after imaging and labs.

## 2016-11-20 NOTE — Patient Instructions (Signed)
We have ordered labs and a CT for you today.   Please complete the stool studies when you can.  We will see what everything shows!   When you need a paracentesis, just call our office so we can arrange.

## 2016-11-20 NOTE — Patient Instructions (Signed)
Go to hospital for testing

## 2016-11-21 LAB — C. DIFFICILE GDH AND TOXIN A/B
C. DIFF TOXIN A/B: NOT DETECTED
C. difficile GDH: NOT DETECTED

## 2016-11-21 LAB — CLOSTRIDIUM DIFFICILE BY PCR: Toxigenic C. Difficile by PCR: DETECTED — CR

## 2016-11-23 ENCOUNTER — Telehealth: Payer: Self-pay | Admitting: Internal Medicine

## 2016-11-23 ENCOUNTER — Ambulatory Visit (HOSPITAL_COMMUNITY)
Admission: RE | Admit: 2016-11-23 | Discharge: 2016-11-23 | Disposition: A | Discharge status: Home / Self Care | Payer: Medicaid - Out of State | Source: Ambulatory Visit | Attending: Gastroenterology | Admitting: Gastroenterology

## 2016-11-23 ENCOUNTER — Telehealth: Payer: Self-pay

## 2016-11-23 ENCOUNTER — Other Ambulatory Visit: Payer: Self-pay

## 2016-11-23 ENCOUNTER — Telehealth: Payer: Self-pay | Admitting: Gastroenterology

## 2016-11-23 ENCOUNTER — Encounter (HOSPITAL_COMMUNITY): Payer: Self-pay

## 2016-11-23 DIAGNOSIS — R188 Other ascites: Secondary | ICD-10-CM

## 2016-11-23 LAB — BODY FLUID CELL COUNT WITH DIFFERENTIAL
EOS FL: 0 %
LYMPHS FL: 67 %
MONOCYTE-MACROPHAGE-SEROUS FLUID: 28 % — AB (ref 50–90)
NEUTROPHIL FLUID: 5 % (ref 0–25)
OTHER CELLS FL: 0 %
Total Nucleated Cell Count, Fluid: 321 cu mm (ref 0–1000)

## 2016-11-23 LAB — GIARDIA ANTIGEN

## 2016-11-23 LAB — GRAM STAIN: Gram Stain: NONE SEEN

## 2016-11-23 MED ORDER — ALBUMIN HUMAN 25 % IV SOLN
INTRAVENOUS | Status: AC
  Administered 2016-11-23: 25 g via INTRAVENOUS
  Filled 2016-11-23: qty 100

## 2016-11-23 MED ORDER — ALBUMIN HUMAN 25 % IV SOLN
25.0000 g | Freq: Once | INTRAVENOUS | Status: AC
  Administered 2016-11-23: 25 g via INTRAVENOUS

## 2016-11-23 MED ORDER — VANCOMYCIN HCL 125 MG PO CAPS: 125.0000 mg | ORAL_CAPSULE | Freq: Four times a day (QID) | ORAL | 0 refills | Status: DC

## 2016-11-23 MED ORDER — METRONIDAZOLE 500 MG PO TABS: 500.0000 mg | ORAL_TABLET | Freq: Three times a day (TID) | ORAL | 0 refills | Status: DC

## 2016-11-23 NOTE — Progress Notes (Signed)
cc'ed to pcp °

## 2016-11-23 NOTE — Telephone Encounter (Signed)
Sister called and stated vancomycin is several hundred dollars and unable to afford. Self-pay. Will send in Flagyl 500 mg TID for 10 days.

## 2016-11-23 NOTE — Procedures (Signed)
PreOperative Dx: Cirrhosis, ascites Postoperative Dx: Cirrhosis, ascites Procedure:   US guided paracentesis Radiologist:  Willia Lampert Anesthesia:  10 ml of1% lidocaine Specimen:  4.3 L of yellow ascitic fluid EBL:   < 1 ml Complications:  None   

## 2016-11-23 NOTE — Telephone Encounter (Signed)
Pt called asking to schedule a para for today. Please call him at [phone]

## 2016-11-23 NOTE — Progress Notes (Signed)
Paracentesis complete no signs of distress 4.3L yellow colored ascites removed.  

## 2016-11-23 NOTE — Telephone Encounter (Signed)
Jennifer from [facility] called- pts cdiff by pcr is positive.

## 2016-11-23 NOTE — Telephone Encounter (Signed)
I have order it for today

## 2016-11-23 NOTE — Telephone Encounter (Signed)
Patient needs cell count and culture at time of para today. Thanks!

## 2016-11-23 NOTE — Telephone Encounter (Signed)
Pt is set up for PARA today at 12:00. He knows that he will need to be there at 11:30

## 2016-11-23 NOTE — Telephone Encounter (Addendum)
   Patient's diarrhea resolved, with none over the weekend. Toxin A and B negative. PCR positive, which can be positive if a carrier. Spoke with sister, and diarrhea has lessened but still present. He has multiple co-morbidities that would increase risk of CDI. In this case, even if he is just a carrier, favor empirically treating. Per new IDSA/SHEA guidelines, initial non-severe treatment is with vancomycin unless cost prohibits or unavailable. I have sent to pharmacy and informed sister.

## 2016-11-23 NOTE — Telephone Encounter (Signed)
noted 

## 2016-11-24 ENCOUNTER — Telehealth: Payer: Self-pay | Admitting: Family Medicine

## 2016-11-24 LAB — STOOL CULTURE

## 2016-11-24 NOTE — Telephone Encounter (Signed)
I was not aware he took gabapentin, sorry.  He may try hydroxyzine 25 mg TID prn anxiety. #30   [facility]

## 2016-11-24 NOTE — Telephone Encounter (Signed)
Can take gabapentin 100 mg tid as needed

## 2016-11-24 NOTE — Telephone Encounter (Signed)
Patient is asking for something for his nerves, please advise?

## 2016-11-25 ENCOUNTER — Other Ambulatory Visit: Payer: Self-pay

## 2016-11-25 ENCOUNTER — Telehealth: Payer: Self-pay

## 2016-11-25 MED ORDER — HYDROXYZINE PAMOATE 25 MG PO CAPS: 25.0000 mg | ORAL_CAPSULE | Freq: Three times a day (TID) | ORAL | 0 refills | Status: DC | PRN

## 2016-11-25 MED ORDER — TIZANIDINE HCL 4 MG PO TABS: 4.0000 mg | ORAL_TABLET | Freq: Three times a day (TID) | ORAL | 0 refills | Status: DC | PRN

## 2016-11-26 NOTE — Telephone Encounter (Signed)
Have Joseph Cortez come back in a month

## 2016-11-26 NOTE — Telephone Encounter (Signed)
Noted & Done scheduled for 12/28/16 and I have mailed him an appointment card as well

## 2016-11-28 LAB — CULTURE, BODY FLUID-BOTTLE: CULTURE: NO GROWTH

## 2016-11-28 LAB — CULTURE, BODY FLUID W GRAM STAIN -BOTTLE

## 2016-11-30 ENCOUNTER — Telehealth: Payer: Self-pay

## 2016-12-01 NOTE — Telephone Encounter (Signed)
Referral pended

## 2016-12-03 ENCOUNTER — Other Ambulatory Visit: Payer: Self-pay

## 2016-12-03 ENCOUNTER — Telehealth: Payer: Self-pay | Admitting: Internal Medicine

## 2016-12-03 ENCOUNTER — Telehealth: Payer: Self-pay | Admitting: *Deleted

## 2016-12-03 ENCOUNTER — Ambulatory Visit (HOSPITAL_COMMUNITY)
Admission: RE | Admit: 2016-12-03 | Discharge: 2016-12-03 | Disposition: A | Discharge status: Home / Self Care | Payer: Medicaid - Out of State | Source: Ambulatory Visit | Attending: Gastroenterology | Admitting: Gastroenterology

## 2016-12-03 DIAGNOSIS — R188 Other ascites: Secondary | ICD-10-CM

## 2016-12-03 NOTE — Sedation Documentation (Signed)
Patient denies pain and is resting comfortably.  

## 2016-12-03 NOTE — Telephone Encounter (Signed)
I have called pts home and mobile numbers, no answer, no voice mail on cell phone.

## 2016-12-03 NOTE — Telephone Encounter (Signed)
Pt wants to schedule a para. Please call [phone]

## 2016-12-03 NOTE — Sedation Documentation (Signed)
Vital signs stable. 

## 2016-12-03 NOTE — Discharge Instructions (Signed)
Paracentesis, Care After  Refer to this sheet in the next few weeks. These instructions provide you with information about caring for yourself after your procedure. Your health care provider may also give you more specific instructions. Your treatment has been planned according to current medical practices, but problems sometimes occur. Call your health care provider if you have any problems or questions after your procedure.  What can I expect after the procedure?  After your procedure, it is common to have a small amount of clear fluid coming from the puncture site.  Follow these instructions at home:  · Return to your normal activities as told by your health care provider. Ask your health care provider what activities are safe for you.  · Take over-the-counter and prescription medicines only as told by your health care provider.  · Do not take baths, swim, or use a hot tub until your health care provider approves.  · Follow instructions from your health care provider about:  ? How to take care of your puncture site.  ? When and how you should change your bandage (dressing).  ? When you should remove your dressing.  · Check your puncture area every day signs of infection. Watch for:  ? Redness, swelling, or pain.  ? Fluid, blood, or pus.  · Keep all follow-up visits as told by your health care provider. This is important.  Contact a health care provider if:  · You have redness, swelling, or pain at your puncture site.  · You start to have more clear fluid coming from your puncture site.  · You have blood or pus coming from your puncture site.  · You have chills.  · You have a fever.  Get help right away if:  · You develop chest pain or shortness of breath.  · You develop increasing pain, discomfort, or swelling in your abdomen.  · You feel dizzy or light-headed or you pass out.  This information is not intended to replace advice given to you by your health care provider. Make sure you discuss any questions you  have with your health care provider.  Document Released: 01/29/2015 Document Revised: 02/20/2016 Document Reviewed: 11/27/2014  Elsevier Interactive Patient Education © 2017 [employer]  Paracentesis  Paracentesis is a procedure to remove excess fluid (ascites) from the belly (abdomen). Ascites can result from certain conditions, such as infection, inflammation, abdominal injury, heart failure, chronic scarring of the liver (cirrhosis), or cancer. Ascites is removed using a needle that is inserted through the skin and tissue into the abdomen.  This procedure may be done:  · To determine the cause of the ascites.  · To relieve symptoms that are caused by the ascites, such as pain or shortness of breath.  · To see if there is bleeding after an abdominal injury.    Tell a health care provider about:  · Any allergies you have.  · All medicines you are taking, including vitamins, herbs, eye drops, creams, and over-the-counter medicines.  · Any problems you or family members have had with anesthetic medicines.  · Any blood disorders you have.  · Any surgeries you have had.  · Any medical conditions you have.  · Whether you are pregnant or may be pregnant.  What are the risks?  Generally, this is a safe procedure. However, problems may occur, including:  · Infection.  · Bleeding.  · Injury to an abdominal organ, such as the bowel (large intestine), liver, spleen, or bladder.  ·   Low blood pressure (hypotension).  · Spreading of cancer, if there are cancer cells in the abdominal fluid.  · Mental status changes in people who have liver disease. These changes would be caused by shifts in the balance of fluids and minerals (electrolytes) in the body.    What happens before the procedure?  · Ask your health care provider about:  ? Changing or stopping your regular medicines. This is especially important if you are taking diabetes medicines or blood thinners.  ? Taking medicines such as aspirin and ibuprofen. These medicines  can thin your blood. Do not take these medicines before your procedure if your health care provider instructs you not to.  · A blood sample may be done to determine your blood clotting time.  · You will be asked to urinate.  What happens during the procedure?  · You may be asked to lie on your back with your head raised (elevated).  · To reduce your risk of infection:  ? Your health care team will wash or sanitize their hands.  ? Your skin will be washed with soap.  · You will be given a medicine to numb the area (local anesthetic).  · Your abdominal skin will be punctured with a needle or a scalpel.  · A drainage tube will be inserted through the puncture site. Fluid will drain through the tube into a container.  · After enough fluid has been removed, the tube will be removed.  · A sample of the fluid will be sent for examination.  · A bandage (dressing) will be placed over the puncture site.  The procedure may vary among health care providers and hospitals.  What happens after the procedure?  · It is your responsibility to get your test results. Ask your health care provider or the department performing the test when your results will be ready.  This information is not intended to replace advice given to you by your health care provider. Make sure you discuss any questions you have with your health care provider.  Document Released: 03/30/2005 Document Revised: 02/20/2016 Document Reviewed: 11/27/2014  Elsevier Interactive Patient Education © 2017 Elsevier Inc.

## 2016-12-03 NOTE — Telephone Encounter (Signed)
PATIENT CALLING TO FOLLOW UP ON IF DR NELSON IS WILLING TO REFER HIM TO BACK SPECIALIST, IF SHE IS WILLING HE HAS ONE IN MIND AND WILL GIVE US THE INFO

## 2016-12-03 NOTE — Telephone Encounter (Signed)
Pt called again asking about scheduling a para. I told him the nurse was aware and would be in touch with him.

## 2016-12-03 NOTE — Telephone Encounter (Signed)
[facility] can go on to [facility] now for Para. Called and informed pt.  Order for para entered in Epic. Para order with Albumin order faxed to Radiology.

## 2016-12-03 NOTE — Sedation Documentation (Signed)
Order to given 25g Albumin with 4L drained- pt does not wish for albumin today. Educated pt on albumin- pt still refused.

## 2016-12-03 NOTE — Telephone Encounter (Signed)
Needs to get a back x ray first and try PT prior to specialist.

## 2016-12-11 ENCOUNTER — Ambulatory Visit (HOSPITAL_COMMUNITY)
Admission: RE | Admit: 2016-12-11 | Discharge: 2016-12-11 | Disposition: A | Discharge status: Home / Self Care | Payer: Medicaid - Out of State | Source: Ambulatory Visit | Attending: Gastroenterology | Admitting: Gastroenterology

## 2016-12-11 ENCOUNTER — Telehealth: Payer: Self-pay | Admitting: Internal Medicine

## 2016-12-11 ENCOUNTER — Other Ambulatory Visit: Payer: Self-pay

## 2016-12-11 DIAGNOSIS — R188 Other ascites: Secondary | ICD-10-CM | POA: Insufficient documentation

## 2016-12-11 MED ORDER — ALBUMIN HUMAN 25 % IV SOLN
INTRAVENOUS | Status: AC
  Administered 2016-12-11: 25 g via INTRAVENOUS
  Filled 2016-12-11: qty 100

## 2016-12-11 MED ORDER — ALBUMIN HUMAN 25 % IV SOLN
25.0000 g | Freq: Once | INTRAVENOUS | Status: AC
  Administered 2016-12-11: 25 g via INTRAVENOUS

## 2016-12-11 NOTE — Telephone Encounter (Signed)
Pt called this morning wanting to schedule a para. Please call him at [phone]

## 2016-12-11 NOTE — Telephone Encounter (Signed)
Pt is set up for PARA today at 12:15. His is aware of appointment

## 2016-12-11 NOTE — Procedures (Signed)
PreOperative Dx: Cirrhosis, ascites Postoperative Dx: Cirrhosis, ascites Procedure:   US guided paracentesis Radiologist:  Ambrea Hegler Anesthesia:  10 ml of1% lidocaine Specimen:  3.5 L of yellow ascitic fluid EBL:   < 1 ml Complications: None   

## 2016-12-11 NOTE — Progress Notes (Signed)
Paracentesis complete no signs of distress. 3.5l yellow colored ascites removed.

## 2016-12-11 NOTE — Telephone Encounter (Signed)
cc'ed to pcp °

## 2016-12-16 ENCOUNTER — Telehealth: Payer: Self-pay | Admitting: Family Medicine

## 2016-12-16 ENCOUNTER — Other Ambulatory Visit: Payer: Self-pay

## 2016-12-16 ENCOUNTER — Telehealth: Payer: Self-pay

## 2016-12-16 DIAGNOSIS — M47896 Other spondylosis, lumbar region: Secondary | ICD-10-CM

## 2016-12-16 DIAGNOSIS — R188 Other ascites: Secondary | ICD-10-CM

## 2016-12-16 NOTE — Telephone Encounter (Signed)
Pt called wanting to get a PARA set up for Thursday. He is set up for PARA on 12/17/16 @ 10:00 am. He is aware

## 2016-12-16 NOTE — Telephone Encounter (Signed)
Noted  

## 2016-12-16 NOTE — Telephone Encounter (Signed)
Patient called because he is having stomach pain 6 out of 10, I scheduled an appt to see her tomorrow morning.  He stated he thinks he needs a referral to a back specialist for back pain as well. cb#: [phone]

## 2016-12-17 ENCOUNTER — Encounter (HOSPITAL_COMMUNITY): Payer: Self-pay

## 2016-12-17 ENCOUNTER — Telehealth: Payer: Self-pay | Admitting: Internal Medicine

## 2016-12-17 ENCOUNTER — Other Ambulatory Visit: Payer: Self-pay

## 2016-12-17 ENCOUNTER — Ambulatory Visit (HOSPITAL_COMMUNITY)
Admission: RE | Admit: 2016-12-17 | Discharge: 2016-12-17 | Disposition: A | Discharge status: Home / Self Care | Payer: Medicaid - Out of State | Source: Ambulatory Visit | Attending: Gastroenterology | Admitting: Gastroenterology

## 2016-12-17 ENCOUNTER — Ambulatory Visit: Payer: Medicaid - Out of State | Admitting: Family Medicine

## 2016-12-17 DIAGNOSIS — R188 Other ascites: Secondary | ICD-10-CM | POA: Insufficient documentation

## 2016-12-17 LAB — GRAM STAIN: Gram Stain: NONE SEEN

## 2016-12-17 LAB — BODY FLUID CELL COUNT WITH DIFFERENTIAL
EOS FL: 0 %
Lymphs, Fluid: 66 %
MONOCYTE-MACROPHAGE-SEROUS FLUID: 11 % — AB (ref 50–90)
Neutrophil Count, Fluid: 23 % (ref 0–25)
OTHER CELLS FL: 0 %
WBC FLUID: 297 uL (ref 0–1000)

## 2016-12-17 NOTE — Telephone Encounter (Signed)
Sister is "Administrator, Civil ServiceCathy". I don't have any openings today but yes we can bump him up from before 4/2. If pain is severe, would recommend going straight to ED. Would want to make sure no acute process going on with his history of cirrhosis.

## 2016-12-17 NOTE — Progress Notes (Signed)
Paracentesis complete no signs of distress. 7L yellow colored ascites removed. Pt did not want IV and Albumin.

## 2016-12-17 NOTE — Telephone Encounter (Signed)
I called sister CathyLynden Ang and offered patient appt with AB for next Tuesday at 1130. Pt wants to wait and see how he feels after his para today. He wants to keep OV with AB on 4/2 so sister can come with him. I told her to let us know what they wanted to do if he wants to come Tuesday next week or wait until 4/2.

## 2016-12-17 NOTE — Telephone Encounter (Signed)
Pt's sister, Annice PihJackie, called to see if patient could be seen by AB today or before 4/2 when he is scheduled next OV. He is on his way to the hospital now to have a para done. Sister said he was having abdominal pains. Please advise and call Annice PihJackie at [phone]

## 2016-12-17 NOTE — Telephone Encounter (Signed)
Has he gone for his lumbar x ray? Has he gone for his physical therapy? Then no referral for back specialist.

## 2016-12-17 NOTE — Telephone Encounter (Signed)
pts sister is aware. Tobi Bastosnna has ordered cell count and cultures on the fluid for today. If he is not feeling better after the para, they will consider going to the ED. Tobi Bastosnna said it was ok to get him in next week if possible, ok to use an urgent spot. Please schedule.

## 2016-12-17 NOTE — Telephone Encounter (Signed)
noted 

## 2016-12-17 NOTE — Procedures (Signed)
PreOperative Dx: Alcoholic cirrhosis, ascites Postoperative Dx: Alcoholic cirrhosis, ascites Procedure:   US guided paracentesis Radiologist:  Tyron RussellBoles Anesthesia:  10 ml of1% lidocaine Specimen:  7 L of cloudy yellow ascitic fluid EBL:   < 1 ml Complications: None

## 2016-12-18 LAB — PATHOLOGIST SMEAR REVIEW

## 2016-12-21 ENCOUNTER — Ambulatory Visit (HOSPITAL_COMMUNITY)
Admission: RE | Admit: 2016-12-21 | Discharge: 2016-12-21 | Disposition: A | Discharge status: Home / Self Care | Payer: Medicaid - Out of State | Source: Ambulatory Visit | Attending: Family Medicine | Admitting: Family Medicine

## 2016-12-21 DIAGNOSIS — M47896 Other spondylosis, lumbar region: Secondary | ICD-10-CM

## 2016-12-21 DIAGNOSIS — I700 Atherosclerosis of aorta: Secondary | ICD-10-CM | POA: Insufficient documentation

## 2016-12-21 DIAGNOSIS — M5136 Other intervertebral disc degeneration, lumbar region: Secondary | ICD-10-CM | POA: Insufficient documentation

## 2016-12-22 ENCOUNTER — Ambulatory Visit: Payer: Medicaid - Out of State | Admitting: Gastroenterology

## 2016-12-22 LAB — CULTURE, BODY FLUID-BOTTLE

## 2016-12-22 LAB — CULTURE, BODY FLUID W GRAM STAIN -BOTTLE: Culture: NO GROWTH

## 2016-12-23 ENCOUNTER — Telehealth: Payer: Self-pay | Admitting: Gastroenterology

## 2016-12-23 ENCOUNTER — Telehealth: Payer: Self-pay | Admitting: Internal Medicine

## 2016-12-23 ENCOUNTER — Other Ambulatory Visit: Payer: Self-pay

## 2016-12-23 DIAGNOSIS — R188 Other ascites: Secondary | ICD-10-CM

## 2016-12-23 NOTE — Telephone Encounter (Signed)
Spoke with Dr. Thurston PoundsLaura DeNunzio (psychiatrist), who would like to prescribe patient acamprosate. This is renally excreted and has been used in patients with ETOH abuse and liver disease. Renal function reviewed and stable, no impairment. I am faxing labs from 2/23 to [phone], attn: Dr. Steffanie DunneNunzio. Will be seeing patient soon.

## 2016-12-23 NOTE — Telephone Encounter (Signed)
AB advised to use existing standing order for para with no fluid analysis. Para scheduled for tomorrow at 1:15pm, arrive at 1:00pm. Called and informed pt.

## 2016-12-23 NOTE — Telephone Encounter (Signed)
Pt called asking to have a para done either today or tomorrow. Please advise and call him at [phone]

## 2016-12-24 ENCOUNTER — Ambulatory Visit (HOSPITAL_COMMUNITY)
Admission: RE | Admit: 2016-12-24 | Discharge: 2016-12-24 | Disposition: A | Discharge status: Home / Self Care | Payer: Medicaid - Out of State | Source: Ambulatory Visit | Attending: Gastroenterology | Admitting: Gastroenterology

## 2016-12-24 ENCOUNTER — Other Ambulatory Visit: Payer: Self-pay | Admitting: Gastroenterology

## 2016-12-24 DIAGNOSIS — R188 Other ascites: Secondary | ICD-10-CM

## 2016-12-24 NOTE — Progress Notes (Signed)
Pt refused IV and Albumin

## 2016-12-24 NOTE — Progress Notes (Signed)
Paracentesis complete no signs of distress. 7L yellow colored ascites removed.  

## 2016-12-28 ENCOUNTER — Encounter: Payer: Self-pay | Admitting: Gastroenterology

## 2016-12-28 ENCOUNTER — Ambulatory Visit (INDEPENDENT_AMBULATORY_CARE_PROVIDER_SITE_OTHER): Payer: Medicaid - Out of State | Admitting: Gastroenterology

## 2016-12-28 ENCOUNTER — Encounter: Payer: Self-pay | Admitting: Family Medicine

## 2016-12-28 ENCOUNTER — Ambulatory Visit (INDEPENDENT_AMBULATORY_CARE_PROVIDER_SITE_OTHER): Payer: Medicaid - Out of State | Admitting: Family Medicine

## 2016-12-28 ENCOUNTER — Telehealth: Payer: Self-pay

## 2016-12-28 ENCOUNTER — Other Ambulatory Visit: Payer: Self-pay

## 2016-12-28 VITALS — BP 120/78 | HR 98 | Temp 97.9°F | Ht 68.0 in | Wt 210.0 lb

## 2016-12-28 VITALS — BP 112/68 | HR 84 | Temp 98.9°F | Resp 18 | Ht 68.0 in | Wt 210.1 lb

## 2016-12-28 DIAGNOSIS — G8929 Other chronic pain: Secondary | ICD-10-CM

## 2016-12-28 DIAGNOSIS — Z720 Tobacco use: Secondary | ICD-10-CM | POA: Diagnosis not present

## 2016-12-28 DIAGNOSIS — R1010 Upper abdominal pain, unspecified: Secondary | ICD-10-CM

## 2016-12-28 DIAGNOSIS — K625 Hemorrhage of anus and rectum: Secondary | ICD-10-CM

## 2016-12-28 DIAGNOSIS — K7031 Alcoholic cirrhosis of liver with ascites: Secondary | ICD-10-CM

## 2016-12-28 DIAGNOSIS — R634 Abnormal weight loss: Secondary | ICD-10-CM | POA: Diagnosis not present

## 2016-12-28 DIAGNOSIS — I8510 Secondary esophageal varices without bleeding: Secondary | ICD-10-CM

## 2016-12-28 DIAGNOSIS — K7460 Unspecified cirrhosis of liver: Principal | ICD-10-CM

## 2016-12-28 DIAGNOSIS — M545 Low back pain: Secondary | ICD-10-CM

## 2016-12-28 NOTE — Patient Instructions (Addendum)
I would like to see what your blood work shows before resuming your fluid pills. We may need a different combination.  We have scheduled you for a colonoscopy and upper endoscopy with Dr. Jena Gauss.  I am going to see about referral for a TIPS procedure.   I am looking into what options we have for liver transplant centers.

## 2016-12-28 NOTE — Patient Instructions (Addendum)
Continue to try to eat well Avoid salt Walk every day that you are able  We will call again about physical therapy  I will add blood work to check for diabetes I will send you a letter with your test results.  If there is anything of concern, we will call right away.  See me every month or two Schedule a physical next visit

## 2016-12-28 NOTE — Progress Notes (Signed)
Referring Provider: Eustace Moore, MD Primary Care Physician:  Eustace Moore, MD Primary GI: Dr. Jena Gauss   Chief Complaint  Patient presents with  . Follow-up    HPI:   Joseph Cortez is a 51 y.o. male presenting today with a history of ETOH cirrhosis, ETOH hepatitis, LLE cellulitis now resolved. Hepatic encephalopathy: prescribed lactulose and have had difficulty obtaining patient assistance for Xifaxan. EGD due in May 2018. No prior colonoscopy. Has had difficulty tolerating Inderal due to hypotension. Requiring frequent paracenteses, averaging about once a week now. Fluid analysis continues to be negative for SBP. CT 2/23 noting mild wall thickening of colon, non-specific in setting of cirrhosis/ascites. Stool studies completed due to diarrhea despite stopping lactulose, and he was positive for CDI PCR. Toxin was negative. Felt that he was a carrier. However, he was empirically treated due to his multiple co-morbidities. Unable to treat with vanc due to cost, so he was treated with flagyl X 10 days.   Returns today with his sister, Lynden Ang. Been off Lasix and aldactone for 3-4 weeks. States he doesn't feel this works well with helping him urinate. Abdominal pain is all over, comes and goes. Has seen blood in stool intermittently, low-volume. Neurontin makes his hands shake. Smokes about 2 packs per week.   Past Medical History:  Diagnosis Date  . AKI (acute kidney injury) (HCC) 07/14/2016  . Anemia   . Anxiety   . Arthritis   . Blood transfusion without reported diagnosis   . Cirrhosis (HCC)   . COPD (chronic obstructive pulmonary disease) (HCC)   . Depression   . Hypokalemia 07/14/2016  . Hyponatremia 07/14/2016  . Neuromuscular disorder (HCC)    Neuropathy  . Neuropathy (HCC)   . Substance abuse   . Ulcer (HCC)    BLEEDING VARICES, ULCERATIONS    Past Surgical History:  Procedure Laterality Date  . BIOPSY  06/27/2015   Procedure: BIOPSY (Gastric);  Surgeon: Corbin Ade, MD;  Location: [facility];  Service: Endoscopy;;  . CHOLECYSTECTOMY    . ESOPHAGEAL BANDING N/A 01/30/2016   Procedure: ESOPHAGEAL BANDING;  Surgeon: Corbin Ade, MD;  Location: [facility] END[facility] SUITE;  Service: Endoscopy;  Laterality: N/A;  . ESOPHAGOGASTRODUODENOSCOPY  January 06, 2016   Dr. Alycia Rossetti at [facility]: duodenitis in bullb, portal gastropathy, 2 columns of large distal esophageal varices, one with flat red spot, s/p band ligation. Needs 4 week surveillance EGD   . ESOPHAGOGASTRODUODENOSCOPY (EGD) WITH PROPOFOL N/A 06/27/2015   ZOX:WRUE erosive reflux esophagitis, portal gastropathy, duodenal bulbar ulcer, negative H.pylori. Surveillance due yearly according to ASGE current guidelines regarding ETOH cirrhosis and ongoing ETOH abuse  . ESOPHAGOGASTRODUODENOSCOPY (EGD) WITH PROPOFOL N/A 01/30/2016   Dr. Jena Gauss: Grade 2 varices s/p banding with complete deflation of varices, portal gastropathy, surveillance in May 2018    Current Outpatient Prescriptions  Medication Sig Dispense Refill  . folic acid (FOLVITE) 1 MG tablet Take 1 tablet (1 mg total) by mouth daily. Reported on 03/12/2016 30 tablet 5  . gabapentin (NEURONTIN) 300 MG capsule Take 300-600 mg by mouth 2 (two) times daily.  in the morning,  at 4pm and  at bedtime    . hydrOXYzine (VISTARIL) 25 MG capsule Take 1 capsule (25 mg total) by mouth 3 (three) times daily as needed. 30 capsule 0  . lactulose (CHRONULAC) 10 GM/15ML solution Take 15 g by mouth 2 (two) times daily.    . Multiple Vitamin (MULTIVITAMIN WITH MINERALS) TABS tablet Take 1 tablet  by mouth daily.    . pantoprazole (PROTONIX) 40 MG tablet Take 1 tablet (40 mg total) by mouth 2 (two) times daily before a meal. 60 tablet 3  . potassium chloride SA (K-DUR,KLOR-CON) 20 MEQ tablet Take 1 tablet (20 mEq total) by mouth 2 (two) times daily. 60 tablet 3  . thiamine 100 MG tablet Take 100 mg by mouth daily.     Marland Kitchen tiZANidine (ZANAFLEX) 4 MG tablet Take 1 tablet (4 mg  total) by mouth every 8 (eight) hours as needed for muscle spasms. 30 tablet 0  . furosemide (LASIX) 40 MG tablet Take 40 mg by mouth 2 (two) times daily.  0  . metroNIDAZOLE (FLAGYL) 500 MG tablet Take 1 tablet (500 mg total) by mouth 3 (three) times daily. For 10 days (Patient not taking: Reported on 12/28/2016) 30 tablet 0  . vancomycin (VANCOCIN) 125 MG capsule Take 1 capsule (125 mg total) by mouth 4 (four) times daily. For 10 days (Patient not taking: Reported on 12/28/2016) 40 capsule 0   No current facility-administered medications for this visit.     Allergies as of 12/28/2016 - Review Complete 12/28/2016  Allergen Reaction Noted  . Sertraline Other (See Comments) 01/07/2016    Family History  Problem Relation Age of Onset  . Colon cancer Maternal Uncle   . Alcohol abuse Maternal Uncle   . Arthritis Mother   . Cancer Mother     breast cancer  . Hypertension Father   . Hyperlipidemia Father   . Lupus Sister   . Alcohol abuse Paternal Uncle   . Alzheimer's disease Maternal Grandmother   . COPD Maternal Grandfather   . Diabetes Maternal Grandfather   . Heart disease Paternal Grandfather   . Alcohol abuse Cousin     Social History   Social History  . Marital status: Divorced    Spouse name: N/A  . Number of children: 2  . Years of education: 11   Occupational History  . disability     former truck driver   Social History Main Topics  . Smoking status: Current Every Day Smoker    Types: Cigarettes  . Smokeless tobacco: Never Used     Comment: 3-4 cigarettes daily   . Alcohol use No     Comment: 1/5th bottle wine every day; stopped drinking October 18th   . Drug use: No     Comment: cocaine historically, several years ago  . Sexual activity: Yes    Birth control/ protection: None   Other Topics Concern  . None   Social History Narrative   Lives alone   Lives near sister   No reg exercise   Disabled for 3 years    Review of Systems: As mentioned in HPI     Physical Exam: BP 120/78   Pulse 98   Temp 97.9 F (36.6 C) (Oral)   Ht  (1.727 m)   Wt 210 lb (95.3 kg)   BMI 31.93 kg/m  General:   Alert and oriented. Appears fatigued. Laying on bed. Sallow appearing Head:  Normocephalic and atraumatic. Eyes:  Mild scleral icterus  Mouth:  Oral mucosa pink and moist. Fruity odor to breath  Abdomen:  +BS, distended with moderately tense ascites Msk:  Muscle wasting upper extremities  Extremities:  Without edema. Neurologic:  Alert and  oriented x4; mild asterixis  Psych:  Alert and cooperative. Normal mood and affect.

## 2016-12-28 NOTE — Progress Notes (Signed)
Chief Complaint  Patient presents with  . Follow-up    1 month   Routine follow up Is getting paracentesis about once every 6 days Still has a large distended abdomen that is uncomfortable In addition he complains of daily pain in his abdomen that is not relieved by current medicine.  He took some pepto-bismol last week for pain relief even though he knows he is not supposed to.  He needs to consult with his GI specialist about what he can take for abdominal pain. He still has chronic daily low back pain.  I have ordered  Lumbar x ray which is unchanged and shows mild DJD changes.  I ordered PT, bu the has not yet gone. I explained to him that he is not a candidate for surgery or injections in his back, and that he must rely on conservative management He still smokes.  He says he smokes about 2 packs per week.  Is still trying to cut down He stated he is not drinking alcohol He is seeing a psychiatrist who is recommending he take acamprosate.  I feel like it is safe.  He still craves alcohol.  I explained that this will likely help.    Patient Active Problem List   Diagnosis Date Noted  . Abdominal pain 11/20/2016  . Diarrhea 11/20/2016  . Tobacco abuse 11/20/2016  . Chronic midline low back pain without sciatica 09/01/2016  . Chronic insomnia 09/01/2016  . 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 Alcoholic hepatitis with ascites 07/14/2016  . Alcohol abuse 07/14/2016  . Anxiety 07/14/2016  . Coagulopathy (HCC) 07/14/2016  . Thrombocytopenia (HCC) 07/14/2016  . Elevated LFTs   . Esophageal varices without bleeding (HCC)   . Portal hypertensive gastropathy   . Duodenal ulcer   . Reflux esophagitis   . Hepatic cirrhosis (HCC) 06/10/2015    Outpatient Encounter Prescriptions as of 12/28/2016  Medication Sig  . folic acid (FOLVITE) 1 MG tablet Take 1 tablet (1 mg total) by mouth daily. Reported on 03/12/2016  . furosemide (LASIX) 40 MG tablet Take 40 mg by  mouth 2 (two) times daily.  Marland Kitchen gabapentin (NEURONTIN) 300 MG capsule Take 300-600 mg by mouth 2 (two) times daily.  in the morning,  at 4pm and  at bedtime  . hydrOXYzine (VISTARIL) 25 MG capsule Take 1 capsule (25 mg total) by mouth 3 (three) times daily as needed.  . metroNIDAZOLE (FLAGYL) 500 MG tablet Take 1 tablet (500 mg total) by mouth 3 (three) times daily. For 10 days  . Multiple Vitamin (MULTIVITAMIN WITH MINERALS) TABS tablet Take 1 tablet by mouth daily.  . pantoprazole (PROTONIX) 40 MG tablet Take 1 tablet (40 mg total) by mouth 2 (two) times daily before a meal.  . potassium chloride SA (K-DUR,KLOR-CON) 20 MEQ tablet Take 1 tablet (20 mEq total) by mouth 2 (two) times daily.  Marland Kitchen thiamine 100 MG tablet Take 100 mg by mouth daily.   Marland Kitchen tiZANidine (ZANAFLEX) 4 MG tablet Take 1 tablet (4 mg total) by mouth every 8 (eight) hours as needed for muscle spasms.  . vancomycin (VANCOCIN) 125 MG capsule Take 1 capsule (125 mg total) by mouth 4 (four) times daily. For 10 days   No facility-administered encounter medications on file as of 12/28/2016.     Allergies  Allergen Reactions  . Sertraline Other (See Comments)    Suicidal ideations    Review of Systems  Constitutional: Positive for appetite change and fatigue.  Negative for fever.  HENT: Positive for congestion and dental problem.   Eyes: Negative for visual disturbance.       Eyes are yellow  Respiratory: Negative for shortness of breath.   Cardiovascular: Positive for leg swelling. Negative for palpitations.       Occasional  Gastrointestinal: Positive for abdominal distention, abdominal pain, diarrhea and nausea. Negative for blood in stool and vomiting.  Genitourinary: Negative for difficulty urinating.       Urine less frequent, dark  Musculoskeletal: Positive for back pain.       Central, daily  Skin: Positive for color change.  Neurological: Negative for dizziness, seizures and headaches.    Psychiatric/Behavioral: Positive for decreased concentration and sleep disturbance. Negative for agitation, behavioral problems and confusion. The patient is nervous/anxious.    BP 112/68 (BP Location: Right Arm, Patient Position: Sitting, Cuff Size: Normal)   Pulse 84   Temp 98.9 F (37.2 C) (Temporal)   Resp 18   Ht  (1.727 m)   Wt 210 lb 1.9 oz (95.3 kg)   SpO2 100%   BMI 31.95 kg/m   Physical Exam  Constitutional: He appears well-developed and well-nourished. No distress.  Jaundice  HENT:  Head: Normocephalic and atraumatic.  Mouth/Throat: Oropharynx is clear and moist.  Eyes: Conjunctivae are normal. Pupils are equal, round, and reactive to light. Scleral icterus is present.  Neck: Normal range of motion. No thyromegaly present.  Cardiovascular: Normal rate, regular rhythm and normal heart sounds.   Pulmonary/Chest: Effort normal and breath sounds normal.  Abdominal: He exhibits distension.  Musculoskeletal: He exhibits no edema.  Very little back ROM.  Tender at the L4 area.  No muscle spasm.  Strength and reflexes symmetric in legs with negative LSR  Lymphadenopathy:    He has no cervical adenopathy.  Neurological: He is alert.  Psychiatric: He has a normal mood and affect. His behavior is normal.    ASSESSMENT/PLAN:  1. Alcoholic cirrhosis of liver with ascites (HCC) ABSTAIN.  Remain under care GI 2. Tobacco abuse Advised to quit 3. Chronic midline low back pain without sciatica Mechanical pain, recommend PT 4. Weight loss Weighed 220 in November, 210 today with ascites 5. Pain of upper abdomen   Patient Instructions  Continue to try to eat well Avoid salt Walk every day that you are able  We will call again about physical therapy  I will add blood work to check for diabetes I will send you a letter with your test results.  If there is anything of concern, we will call right away.  See me every month or two Schedule a physical next  visit    Eustace Moore, MD

## 2016-12-28 NOTE — Telephone Encounter (Signed)
Called and informed pt of pre-op appt 01/18/17 at 12:45pm. Letter also mailed.

## 2016-12-29 LAB — FOLATE

## 2016-12-29 LAB — PROTIME-INR
INR: 1.3 — AB
PROTHROMBIN TIME: 13.8 s — AB (ref 9.0–11.5)

## 2016-12-29 LAB — AMMONIA: Ammonia: 93 umol/L — ABNORMAL HIGH (ref <=47)

## 2016-12-30 ENCOUNTER — Other Ambulatory Visit: Payer: Self-pay | Admitting: Gastroenterology

## 2016-12-30 ENCOUNTER — Ambulatory Visit (HOSPITAL_COMMUNITY)
Admission: RE | Admit: 2016-12-30 | Discharge: 2016-12-30 | Disposition: A | Discharge status: Home / Self Care | Payer: Self-pay | Source: Ambulatory Visit | Attending: Gastroenterology | Admitting: Gastroenterology

## 2016-12-30 ENCOUNTER — Other Ambulatory Visit (HOSPITAL_COMMUNITY): Payer: Self-pay | Admitting: Nurse Practitioner

## 2016-12-30 ENCOUNTER — Encounter (HOSPITAL_COMMUNITY): Payer: Self-pay

## 2016-12-30 DIAGNOSIS — R188 Other ascites: Secondary | ICD-10-CM

## 2016-12-30 NOTE — Progress Notes (Signed)
Ammonia is elevated. Needs to make sure he is taking lactulose to achieve 3 soft bowel movements a day. May stop folic acid supplement and continue just MVI for now. Lynden Ang, patient's sister, informed. Can we see where the CMP and CBC are? Dr. Delton See had ordered that. I wonder if it was from [facility]?

## 2016-12-30 NOTE — Progress Notes (Signed)
Paracentesis complete no signs of distress; 6L yellow colored ascites removed.  

## 2016-12-31 NOTE — Progress Notes (Signed)
cc'ed to pcp °

## 2016-12-31 NOTE — Assessment & Plan Note (Addendum)
51 year old male with history of ETOH cirrhosis, decompensated, seeming to be near end-stage now. He has had no alcohol in almost 4 months, which is encouraging. He is requiring almost weekly if not more often paracenteses, and he has not been taking diuretics consistently. However, he has had worsening ascites even with diuretics historically. No evidence of PVT on recent CT. He is due for EGD for variceal screening; he has not been on a non-selective beta-blocker due to intolerance with hypotension. Unfortunately, he has also not been compliant with lactulose therapy and has mild asterixis on exam today. At time of EGD, will pursue colonoscopy due to occasional low-volume hematochezia. He also had evidence of possible colitis on CT about a month ago, which is non-specific in setting of ascites. As of note, no diarrhea today. Continues with abdominal discomfort, which I feel is mainly due to tense ascites. He has had negative fluid analyses on multiple prior occasions.   1. Proceed with upper endoscopy and colonoscopy in the near future with Dr. Jena Gauss. The risks, benefits, and alternatives have been discussed in detail with patient. They have stated understanding and desire to proceed. PROPOFOL  2. Obtain recent CBC, CMP, INR, add ammonia 3. Resume lactulose to achieve 3 soft BMs today. Will research status of Xifaxan patient assistance paperwork.  4. TIPS idea raised but with history of encephalopathy, would be concerned about worsening. Will need to titrate diuretics and enforce compliance prior to consideration of this intervention. 5. Will research liver transplant evaluation centers: he has no insurance, so this is highly unlikely to be an option, and I am concerned about his life expectancy to even reaching candidacy. Not a candidate for Medicaid in [place] due to liver disease from ETOH, but his sister is attempting to obtain disability due to chronic back pain.  6. Will continue to provide  supportive care, continue outpatient palliative team care, appreciate Dr. Lindaann Slough close involvement with patient.

## 2017-01-01 NOTE — Progress Notes (Signed)
CBC and CMP was ordered by Dr. Delton See. Did he have this done? Maybe he never took the labs to get done. We need these regardless. If he hasn't done, needs to have done.

## 2017-01-04 ENCOUNTER — Encounter: Payer: Self-pay | Admitting: Gastroenterology

## 2017-01-05 ENCOUNTER — Telehealth: Payer: Self-pay

## 2017-01-05 NOTE — Telephone Encounter (Signed)
I called, his voice mail is not

## 2017-01-05 NOTE — Telephone Encounter (Signed)
-----   Message from Joseph Moore, MD sent at 01/05/2017 12:55 PM EDT ----- I reviewed lab work from Joseph Cortez when he was here last time. Unfortunately, he the lab work drawn after his GI visit. The lab only drew his GI labs are not once we ordered. Please call and see if he'll go back to the laboratory to get these done.

## 2017-01-06 ENCOUNTER — Ambulatory Visit (HOSPITAL_COMMUNITY)
Admission: RE | Admit: 2017-01-06 | Discharge: 2017-01-06 | Disposition: A | Discharge status: Home / Self Care | Payer: Medicaid - Out of State | Source: Ambulatory Visit | Attending: Gastroenterology | Admitting: Gastroenterology

## 2017-01-06 ENCOUNTER — Telehealth: Payer: Self-pay

## 2017-01-06 ENCOUNTER — Other Ambulatory Visit: Payer: Self-pay

## 2017-01-06 ENCOUNTER — Telehealth: Payer: Self-pay | Admitting: Gastroenterology

## 2017-01-06 ENCOUNTER — Other Ambulatory Visit: Payer: Self-pay | Admitting: Gastroenterology

## 2017-01-06 DIAGNOSIS — R188 Other ascites: Secondary | ICD-10-CM

## 2017-01-06 DIAGNOSIS — K7031 Alcoholic cirrhosis of liver with ascites: Secondary | ICD-10-CM

## 2017-01-06 DIAGNOSIS — K7011 Alcoholic hepatitis with ascites: Secondary | ICD-10-CM

## 2017-01-06 LAB — CBC WITH DIFFERENTIAL/PLATELET
BASOS ABS: 64 {cells}/uL (ref 0–200)
Basophils Relative: 1 %
EOS ABS: 64 {cells}/uL (ref 15–500)
Eosinophils Relative: 1 %
HCT: 40.4 % (ref 38.5–50.0)
Hemoglobin: 13.6 g/dL (ref 13.2–17.1)
LYMPHS PCT: 28 %
Lymphs Abs: 1792 cells/uL (ref 850–3900)
MCH: 31.9 pg (ref 27.0–33.0)
MCHC: 33.7 g/dL (ref 32.0–36.0)
MCV: 94.8 fL (ref 80.0–100.0)
MONOS PCT: 6 %
MPV: 9.2 fL (ref 7.5–12.5)
Monocytes Absolute: 384 cells/uL (ref 200–950)
Neutro Abs: 4096 cells/uL (ref 1500–7800)
Neutrophils Relative %: 64 %
PLATELETS: 142 10*3/uL (ref 140–400)
RBC: 4.26 MIL/uL (ref 4.20–5.80)
RDW: 14.5 % (ref 11.0–15.0)
WBC: 6.4 10*3/uL (ref 3.8–10.8)

## 2017-01-06 MED ORDER — RIFAXIMIN 550 MG PO TABS: 550.0000 mg | ORAL_TABLET | Freq: Two times a day (BID) | ORAL | 3 refills | Status: DC

## 2017-01-06 NOTE — Telephone Encounter (Signed)
Referral has been made to IR for TIPPS

## 2017-01-06 NOTE — Telephone Encounter (Signed)
I also spoke with Cathy(sister) and she stated she's been speaking with a disability attorney to help he with processing Mr. Ravenscroft disability claim.

## 2017-01-06 NOTE — Telephone Encounter (Signed)
I spoke with the patient and he stated that he was told you must be on disability in order to receive full Medicaid benefits.  He applied for disability and he was denied, however he's not reapplied for disability as of right now.

## 2017-01-06 NOTE — Telephone Encounter (Signed)
I spoke with the patient and he gave me permission to speak with his case worker.

## 2017-01-06 NOTE — Telephone Encounter (Signed)
pts sisterLynden Ang, came by the office to bring patient assistance forms for xifaxan and to pick up lab orders that I spoke with her about on Monday. Pt is going to the lab today after para to have labs done which were ordered by Dr.Nelson, but the lab did not draw them when he went last week and did our labs. She said pt was c/o pain in his R groin area, she said he told her that he could feel it sticking out but it doesn't hurt when he pushes on it. It only hurts when he moves. She feels like it is a hernia. Pt has ov with Dr.Nelson tomorrow to see if it is a hernia but she will be unable to go to that appointment with him. She wanted to let Tobi Bastos know what was going on. I advised her that if Dr.Nelson felt it was related to his cirrhosis and not a hernia, that she would contact us. Routing to [employer] and Dr.Nelson for [employer].

## 2017-01-06 NOTE — Telephone Encounter (Signed)
Noted. Is there any way that we can add an AFP for him to complete today?   I have sent referral for TIPS consideration to [facility] clinical pool.

## 2017-01-06 NOTE — Procedures (Signed)
PreOperative Dx: Cirrhosis, ascites Postoperative Dx: Cirrhosis, ascites Procedure:   US guided paracentesis Radiologist:  Lindsay Soulliere Anesthesia:  10 ml of1% lidocaine Specimen:  5.3 L of yellow ascitic fluid EBL:   < 1 ml Complications: None  

## 2017-01-06 NOTE — Progress Notes (Signed)
Paracentesis complete no signs of distress. 5.3L yellow colored ascites removed. Pt did not want Albumin today.

## 2017-01-06 NOTE — Telephone Encounter (Signed)
TIPPS referral has been canceled

## 2017-01-06 NOTE — Telephone Encounter (Signed)
Lab order has been faxed to the lab 

## 2017-01-06 NOTE — Telephone Encounter (Signed)
I meant to order a TIPS referral when I last saw patient. We addressed a lot during that visit, so I didn't mean to leave that out.  I don't know if he would be a candidate or not, but we can see what they say.

## 2017-01-06 NOTE — Telephone Encounter (Signed)
Patient has no insurance coverage. He is a resident of [place]. His sister told me he has had trouble with approval for Medicaid/disability insurance due to current health condition (cirrhosis) as a result of ETOH use. She told me this was one of the restrictions. I may not be saying the insurance coverage correctly, but from what I understand, they have had trouble with any governmental assistance. Is there anything we can do?

## 2017-01-06 NOTE — Telephone Encounter (Signed)
[employer] [place]    [phone]

## 2017-01-06 NOTE — Telephone Encounter (Signed)
Sister is asking about a referral for TIPPS. Please advise

## 2017-01-06 NOTE — Telephone Encounter (Signed)
See other note. I inadvertently did not put on encounter sheet. We had discussed a lot that day.

## 2017-01-06 NOTE — Telephone Encounter (Signed)
I'm sorry for the back and forth: can we cancel this referral for right now. In looking back, we need to really maximize diuretic therapy before going this route. I have informed patient's sister.

## 2017-01-07 ENCOUNTER — Ambulatory Visit (INDEPENDENT_AMBULATORY_CARE_PROVIDER_SITE_OTHER): Payer: Medicaid - Out of State | Admitting: Family Medicine

## 2017-01-07 ENCOUNTER — Encounter: Payer: Self-pay | Admitting: Family Medicine

## 2017-01-07 VITALS — BP 106/70 | HR 80 | Temp 98.6°F | Resp 18 | Ht 66.0 in | Wt 206.0 lb

## 2017-01-07 DIAGNOSIS — R1031 Right lower quadrant pain: Secondary | ICD-10-CM

## 2017-01-07 DIAGNOSIS — K429 Umbilical hernia without obstruction or gangrene: Secondary | ICD-10-CM | POA: Diagnosis not present

## 2017-01-07 DIAGNOSIS — G8929 Other chronic pain: Secondary | ICD-10-CM

## 2017-01-07 DIAGNOSIS — M545 Low back pain: Secondary | ICD-10-CM

## 2017-01-07 LAB — COMPLETE METABOLIC PANEL WITH GFR
ALBUMIN: 3.3 g/dL — AB (ref 3.6–5.1)
ALT: 13 U/L (ref 9–46)
AST: 33 U/L (ref 10–40)
Alkaline Phosphatase: 92 U/L (ref 40–115)
BILIRUBIN TOTAL: 2.1 mg/dL — AB (ref 0.2–1.2)
BUN: 5 mg/dL — ABNORMAL LOW (ref 7–25)
CO2: 22 mmol/L (ref 20–31)
CREATININE: 0.8 mg/dL (ref 0.60–1.35)
Calcium: 8.7 mg/dL (ref 8.6–10.3)
Chloride: 104 mmol/L (ref 98–110)
GFR, Est Non African American: >89 mL/min (ref >=60)
GLUCOSE: 127 mg/dL — AB (ref 65–99)
Potassium: 3.9 mmol/L (ref 3.5–5.3)
SODIUM: 136 mmol/L (ref 135–146)
TOTAL PROTEIN: 6.1 g/dL (ref 6.1–8.1)

## 2017-01-07 LAB — HEMOGLOBIN A1C
Hgb A1c MFr Bld: 4.8 % (ref <5.7)
MEAN PLASMA GLUCOSE: 91 mg/dL

## 2017-01-07 MED ORDER — METHOCARBAMOL 750 MG PO TABS: 750.0000 mg | ORAL_TABLET | Freq: Four times a day (QID) | ORAL | 0 refills | Status: DC | PRN

## 2017-01-07 NOTE — Telephone Encounter (Signed)
I called social services and left a message for with, Ms. Mounkaila the patient's case worker.

## 2017-01-07 NOTE — Progress Notes (Signed)
Chief Complaint  Patient presents with  . Groin Pain    right side   Patient is here for an acute visit. Since he was seen last she has noticed that has a pain and a bulge in his right groin. It's worse when he is upright and standing and better when he is resting. No problems with bowels or bladder. No fever. He also has noticed an increase in the bulge around his umbilicus. I discussed with him that these are relatively weak points in the abdominal wall and with his large repeated bouts of ascites, it's not unexpected that he would have hernia formation. Further discussed with him that he is a poor surgical candidate with his liver impairment, ascites, infection risk, high INR, and cigarette smoking. I am going to send him to a surgical specialist for consultation regarding management, I expect we will observe them for the time being. Appetite is still poor. He struggles with lactulose dose and diarrhea, and on his most recent laboratory had an elevated ammonia. I did another blood work to check for diabetes and medical illness. His hemoglobin A1c is low. Electrolytes were normal, kidney function normal, albumin mildly low. Blood count was good.  Joseph Cortez is seen today with his sister Olegario Messier  He takes tizanidine as a muscle relaxer for his chronic back pain. He states it causes excessive dry mouth. He would like to try different muscle relaxer. I looked at methocarbamol, and this does not require any dose adjustment for liver failure. He will be given a limited number of these to try.  Patient Active Problem List   Diagnosis Date Noted  . Abdominal pain 11/20/2016  . Diarrhea 11/20/2016  . Tobacco abuse 11/20/2016  . Chronic midline low back pain without sciatica 09/01/2016  . Chronic insomnia 09/01/2016  . 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 Alcoholic hepatitis with ascites 07/14/2016  . Alcohol abuse 07/14/2016  . Anxiety 07/14/2016  . Coagulopathy (HCC)  07/14/2016  . Thrombocytopenia (HCC) 07/14/2016  . Elevated LFTs   . Esophageal varices without bleeding (HCC)   . Portal hypertensive gastropathy   . Duodenal ulcer   . Reflux esophagitis   . Hepatic cirrhosis (HCC) 06/10/2015    Outpatient Encounter Prescriptions as of 01/07/2017  Medication Sig  . folic acid (FOLVITE) 1 MG tablet Take 1 tablet (1 mg total) by mouth daily. Reported on 03/12/2016  . gabapentin (NEURONTIN) 300 MG capsule Take 300-600 mg by mouth 2 (two) times daily.  in the morning,  at 4pm and  at bedtime  . hydrOXYzine (VISTARIL) 25 MG capsule Take 1 capsule (25 mg total) by mouth 3 (three) times daily as needed.  . Multiple Vitamin (MULTIVITAMIN WITH MINERALS) TABS tablet Take 1 tablet by mouth daily.  . pantoprazole (PROTONIX) 40 MG tablet Take 1 tablet (40 mg total) by mouth 2 (two) times daily before a meal.  . potassium chloride SA (K-DUR,KLOR-CON) 20 MEQ tablet Take 1 tablet (20 mEq total) by mouth 2 (two) times daily.  . rifaximin (XIFAXAN) 550 MG TABS tablet Take 1 tablet (550 mg total) by mouth 2 (two) times daily.  Marland Kitchen thiamine 100 MG tablet Take 100 mg by mouth daily.   . methocarbamol (ROBAXIN) 750 MG tablet Take 1 tablet (750 mg total) by mouth every 6 (six) hours as needed for muscle spasms.   No facility-administered encounter medications on file as of 01/07/2017.     Allergies  Allergen Reactions  . Sertraline Other (  See Comments)    Suicidal ideations    Review of Systems  Constitutional: Positive for appetite change and fatigue. Negative for fever.  HENT: Positive for dental problem. Negative for congestion and nosebleeds.   Eyes: Negative for visual disturbance.       Eyes are yellow  Respiratory: Negative for shortness of breath.   Cardiovascular: Positive for leg swelling. Negative for palpitations.       Occasional  Gastrointestinal: Positive for abdominal distention, abdominal pain, diarrhea and nausea. Negative for blood in  stool and vomiting.       Umbilical and groin swelling  Genitourinary: Negative for difficulty urinating.  Musculoskeletal: Positive for back pain.       Central, daily  Skin: Positive for color change.  Neurological: Negative for dizziness, seizures and headaches.  Hematological: Bruises/bleeds easily.  Psychiatric/Behavioral: Positive for decreased concentration and sleep disturbance. Negative for agitation, behavioral problems and confusion. The patient is nervous/anxious.        Under care of psychiatry    BP 106/70 (BP Location: Right Arm, Patient Position: Sitting, Cuff Size: Normal)   Pulse 80   Temp 98.6 F (37 C) (Temporal)   Resp 18   Ht  (1.676 m)   Wt 206 lb (93.4 kg)   SpO2 100%   BMI 33.25 kg/m   Physical Exam  Constitutional: He appears well-developed and well-nourished.  Pleasant. Ascites. Jaundice.  HENT:  Head: Normocephalic and atraumatic.  Mouth/Throat: Oropharynx is clear and moist.  Eyes: Conjunctivae are normal. Pupils are equal, round, and reactive to light. Scleral icterus is present.  Cardiovascular: Normal rate and regular rhythm.   Pulmonary/Chest: Effort normal and breath sounds normal.  Abdominal: He exhibits distension. There is no tenderness. A hernia is present.  Patient is a soft reducible umbilical hernia. The right inguinal ring is tender but not palpably a frank hernia. Mild tenderness in the muscular abductor insertion at the pubis.    ASSESSMENT/PLAN:  1. Umbilical hernia without obstruction and without gangrene  - Ambulatory referral to General Surgery  2. Pain in the groin, right  - Ambulatory referral to General Surgery  3. Chronic midline low back pain without sciatica Stop tizanidine. Start Robaxin. Call for problems.   Patient Instructions  Referred to surgery for consultation  Stop the tizanidine Start methocarbamol as needed muscle relaxer  See me on usual schedule   Eustace Moore, MD

## 2017-01-07 NOTE — Patient Instructions (Signed)
Referred to surgery for consultation  Stop the tizanidine Start methocarbamol as needed muscle relaxer  See me on usual schedule

## 2017-01-13 ENCOUNTER — Other Ambulatory Visit (HOSPITAL_COMMUNITY): Payer: Self-pay | Admitting: Nurse Practitioner

## 2017-01-13 ENCOUNTER — Ambulatory Visit (HOSPITAL_COMMUNITY): Payer: Medicaid - Out of State

## 2017-01-13 ENCOUNTER — Telehealth: Payer: Self-pay | Admitting: Gastroenterology

## 2017-01-13 ENCOUNTER — Ambulatory Visit (HOSPITAL_COMMUNITY)
Admission: RE | Admit: 2017-01-13 | Discharge: 2017-01-13 | Disposition: A | Discharge status: Home / Self Care | Payer: Medicaid - Out of State | Source: Ambulatory Visit | Attending: Nurse Practitioner | Admitting: Nurse Practitioner

## 2017-01-13 ENCOUNTER — Other Ambulatory Visit: Payer: Medicaid - Out of State

## 2017-01-13 DIAGNOSIS — R188 Other ascites: Secondary | ICD-10-CM

## 2017-01-13 MED ORDER — SPIRONOLACTONE 50 MG PO TABS: 100.0000 mg | ORAL_TABLET | Freq: Every day | ORAL | 3 refills | Status: DC

## 2017-01-13 MED ORDER — PANTOPRAZOLE SODIUM 40 MG PO TBEC: 40.0000 mg | DELAYED_RELEASE_TABLET | Freq: Two times a day (BID) | ORAL | 3 refills | Status: DC

## 2017-01-13 MED ORDER — FUROSEMIDE 20 MG PO TABS: 40.0000 mg | ORAL_TABLET | Freq: Every day | ORAL | 3 refills | Status: DC

## 2017-01-13 NOTE — Telephone Encounter (Signed)
Spoke with the sister, he is taking otc potassium one a day. He said it helps with the cramps that he gets when he takes gabapentin. He said that it supplies 3% of your daily allowance of potassium. He is not taking prescription potassium. Does he need to stop this?

## 2017-01-13 NOTE — Telephone Encounter (Signed)
Pt and his sister are aware. He needs rx for protonix and lasix sent in to [facility] pharmacy. Please send them in. Pt has ov with AB on 01/27/17 and is aware of date and time.

## 2017-01-13 NOTE — Telephone Encounter (Signed)
CMP reviewed.   Patient needs to resume Lasix 40 mg and Aldactone 100 mg each morning. Start JUST ONCE A DAY FOR NOW.   Let's have him return to see me that last week of April/first week of May, in follow-up after colonoscopy.   He should already have lasix and aldactone at home.    Lab Results  Component Value Date   ALT 13 01/06/2017   AST 33 01/06/2017   ALKPHOS 92 01/06/2017   BILITOT 2.1 (H) 01/06/2017   Lab Results  Component Value Date   CREATININE 0.80 01/06/2017   BUN 5 (L) 01/06/2017   NA 136 01/06/2017   K 3.9 01/06/2017   CL 104 01/06/2017   CO2 22 01/06/2017

## 2017-01-13 NOTE — Telephone Encounter (Signed)
Done. I also sent in aldactone so he can have that. I see potassium is on his med list, but that may not be current. He does not need to take that.

## 2017-01-13 NOTE — Telephone Encounter (Signed)
Protonix refilled

## 2017-01-14 NOTE — Telephone Encounter (Signed)
I would hold the potassium for now or use absolutely sparingly.

## 2017-01-14 NOTE — Telephone Encounter (Signed)
Tried to call pts sisterLynden Ang, [place] on voicemail with instructions.

## 2017-01-15 ENCOUNTER — Ambulatory Visit (HOSPITAL_COMMUNITY)
Admission: RE | Admit: 2017-01-15 | Discharge: 2017-01-15 | Disposition: A | Discharge status: Home / Self Care | Payer: Medicaid - Out of State | Source: Ambulatory Visit | Attending: Nurse Practitioner | Admitting: Nurse Practitioner

## 2017-01-15 ENCOUNTER — Encounter (HOSPITAL_COMMUNITY): Payer: Self-pay

## 2017-01-15 ENCOUNTER — Ambulatory Visit (HOSPITAL_COMMUNITY): Payer: Medicaid - Out of State

## 2017-01-15 ENCOUNTER — Other Ambulatory Visit: Payer: Self-pay | Admitting: Gastroenterology

## 2017-01-15 DIAGNOSIS — R188 Other ascites: Secondary | ICD-10-CM

## 2017-01-15 NOTE — Procedures (Signed)
INDICATION: [Cirrhosis.  Substance abuse.] EXAM: ULTRASOUND GUIDED [RIGHT UPPER QUADRANT] PARACENTESIS GRIP-IR: Category: Fluids   Subcategory: [Paracentesis]   Follow-Up: [None]   MEDICATIONS: [None.] COMPLICATIONS: <48 hrs).\nSIR LEVEL D - Requires major therapy, prolonged hospitalization (>48 hours).\nSIR LEVEL E - Permanent adverse sequelae.\nSIR LEVEL F - Death.\nDelayed -","cue":"complications","grammar":"None immediate.  SIR Level A - No therapy, no consequence.  SIR LEVEL B - Normal therapy, includes overnight admission for observation.  SIR LEVEL C - Requires therapy, minor hospitalization (<48 hrs).  SIR LEVEL D - Requires major therapy, prolonged hospitalization (>48 hours).  SIR LEVEL E - Permanent adverse sequelae.  SIR LEVEL F - Death.  Delayed -","dictation":false}}'>[None immediate.] PROCEDURE: Informed written consent was obtained from the patient after a discussion of the risks, benefits and alternatives to treatment. A timeout was performed prior to the initiation of the procedure.   Initial ultrasound scanning demonstrates a [large] amount of ascites within the right lower abdominal quadrant. The right lower abdomen was prepped and draped in the usual sterile fashion. 1% lidocaine with epinephrine was used for local anesthesia.    Following this, a [19 gauge, 7-cm, Yueh] catheter was introduced. An ultrasound image was saved for documentation purposes. The paracentesis was performed. The catheter was removed and a dressing was applied. The patient tolerated the procedure well without immediate post procedural complication.  FINDINGS: A total of approximately [7100 CC] of [yellow] fluid was removed.  IMPRESSION:  Successful ultrasound-guided paracentesis yielding [7.1] liters of peritoneal fluid.

## 2017-01-15 NOTE — Progress Notes (Signed)
Paracentesis complete no signs of distress. 7.1 Yellow colored ascites removed. Pt refused IV and Albumin.

## 2017-01-18 ENCOUNTER — Other Ambulatory Visit (HOSPITAL_COMMUNITY): Payer: Medicaid - Out of State

## 2017-01-19 ENCOUNTER — Ambulatory Visit: Payer: Self-pay | Admitting: General Surgery

## 2017-01-19 ENCOUNTER — Encounter (HOSPITAL_COMMUNITY): Admission: RE | Admit: 2017-01-19 | Payer: Medicaid - Out of State | Source: Ambulatory Visit

## 2017-01-20 ENCOUNTER — Ambulatory Visit (HOSPITAL_COMMUNITY): Payer: Medicaid - Out of State

## 2017-01-22 ENCOUNTER — Encounter (HOSPITAL_COMMUNITY): Payer: Self-pay

## 2017-01-22 ENCOUNTER — Ambulatory Visit (HOSPITAL_COMMUNITY): Payer: Medicaid - Out of State

## 2017-01-25 ENCOUNTER — Encounter (HOSPITAL_COMMUNITY): Admission: RE | Payer: Self-pay | Source: Ambulatory Visit

## 2017-01-25 ENCOUNTER — Ambulatory Visit (HOSPITAL_COMMUNITY)
Admission: RE | Admit: 2017-01-25 | Payer: Medicaid - Out of State | Source: Ambulatory Visit | Admitting: Internal Medicine

## 2017-01-25 SURGERY — COLONOSCOPY WITH PROPOFOL
Anesthesia: Monitor Anesthesia Care

## 2017-01-27 ENCOUNTER — Telehealth: Payer: Self-pay | Admitting: Gastroenterology

## 2017-01-27 ENCOUNTER — Encounter: Payer: Self-pay | Admitting: Gastroenterology

## 2017-01-27 ENCOUNTER — Ambulatory Visit: Payer: Medicaid - Out of State | Admitting: Gastroenterology

## 2017-01-27 ENCOUNTER — Telehealth: Payer: Self-pay | Admitting: Internal Medicine

## 2017-01-27 MED ORDER — PANTOPRAZOLE SODIUM 40 MG PO TBEC: 40.0000 mg | DELAYED_RELEASE_TABLET | Freq: Two times a day (BID) | ORAL | 3 refills | Status: AC

## 2017-01-27 NOTE — Telephone Encounter (Signed)
Pt's sister called to cancel his OV with AB today. She said that he was too sick to come. She asked to reschedule him with AB and is aware of OV on 5/22. He also needs his protonix prescription called into [facility] pharmacy in [place].

## 2017-01-27 NOTE — Telephone Encounter (Signed)
Is there an urgent visit we can put Joseph Cortez into?

## 2017-01-27 NOTE — Telephone Encounter (Signed)
OV is 5/25

## 2017-01-27 NOTE — Telephone Encounter (Signed)
Thank you :)

## 2017-01-27 NOTE — Telephone Encounter (Signed)
Thanks! I don't see where he is coming on 5/22?

## 2017-01-27 NOTE — Telephone Encounter (Signed)
rx was sent to [facility]- pt requested [facility]. We [facility], I have filled it out and faxed it back to [facility] per AB rx.

## 2017-01-28 ENCOUNTER — Emergency Department (HOSPITAL_COMMUNITY)
Admission: EM | Admit: 2017-01-28 | Discharge: 2017-01-28 | Disposition: A | Discharge status: Home / Self Care | Payer: Medicaid - Out of State | Attending: Emergency Medicine | Admitting: Emergency Medicine

## 2017-01-28 ENCOUNTER — Encounter (HOSPITAL_COMMUNITY): Payer: Self-pay | Admitting: *Deleted

## 2017-01-28 DIAGNOSIS — F10120 Alcohol abuse with intoxication, uncomplicated: Secondary | ICD-10-CM | POA: Insufficient documentation

## 2017-01-28 DIAGNOSIS — K7031 Alcoholic cirrhosis of liver with ascites: Secondary | ICD-10-CM | POA: Insufficient documentation

## 2017-01-28 DIAGNOSIS — Z79899 Other long term (current) drug therapy: Secondary | ICD-10-CM | POA: Insufficient documentation

## 2017-01-28 DIAGNOSIS — F10920 Alcohol use, unspecified with intoxication, uncomplicated: Secondary | ICD-10-CM

## 2017-01-28 DIAGNOSIS — F17210 Nicotine dependence, cigarettes, uncomplicated: Secondary | ICD-10-CM | POA: Insufficient documentation

## 2017-01-28 DIAGNOSIS — R1084 Generalized abdominal pain: Secondary | ICD-10-CM

## 2017-01-28 DIAGNOSIS — J449 Chronic obstructive pulmonary disease, unspecified: Secondary | ICD-10-CM | POA: Insufficient documentation

## 2017-01-28 LAB — CBC WITH DIFFERENTIAL/PLATELET
Basophils Absolute: 0.1 10*3/uL (ref 0.0–0.1)
Basophils Relative: 1 %
EOS ABS: 0.2 10*3/uL (ref 0.0–0.7)
Eosinophils Relative: 3 %
HEMATOCRIT: 41.9 % (ref 39.0–52.0)
HEMOGLOBIN: 14.7 g/dL (ref 13.0–17.0)
LYMPHS ABS: 2.4 10*3/uL (ref 0.7–4.0)
LYMPHS PCT: 30 %
MCH: 32.4 pg (ref 26.0–34.0)
MCHC: 35.1 g/dL (ref 30.0–36.0)
MCV: 92.3 fL (ref 78.0–100.0)
Monocytes Absolute: 0.4 10*3/uL (ref 0.1–1.0)
Monocytes Relative: 6 %
NEUTROS PCT: 60 %
Neutro Abs: 4.8 10*3/uL (ref 1.7–7.7)
Platelets: 134 10*3/uL — ABNORMAL LOW (ref 150–400)
RBC: 4.54 MIL/uL (ref 4.22–5.81)
RDW: 14.4 % (ref 11.5–15.5)
WBC: 7.9 10*3/uL (ref 4.0–10.5)

## 2017-01-28 LAB — COMPREHENSIVE METABOLIC PANEL
ALBUMIN: 3.2 g/dL — AB (ref 3.5–5.0)
ALK PHOS: 149 U/L — AB (ref 38–126)
ALT: 26 U/L (ref 17–63)
ANION GAP: 9 (ref 5–15)
AST: 72 U/L — ABNORMAL HIGH (ref 15–41)
BUN: 5 mg/dL — ABNORMAL LOW (ref 6–20)
CALCIUM: 8.7 mg/dL — AB (ref 8.9–10.3)
CHLORIDE: 105 mmol/L (ref 101–111)
CO2: 24 mmol/L (ref 22–32)
Creatinine, Ser: 0.62 mg/dL (ref 0.61–1.24)
GFR calc Af Amer: >60 mL/min (ref >60)
GFR calc non Af Amer: >60 mL/min (ref >60)
Glucose, Bld: 110 mg/dL — ABNORMAL HIGH (ref 65–99)
Potassium: 3.6 mmol/L (ref 3.5–5.1)
SODIUM: 138 mmol/L (ref 135–145)
Total Bilirubin: 1.9 mg/dL — ABNORMAL HIGH (ref 0.3–1.2)
Total Protein: 7.1 g/dL (ref 6.5–8.1)

## 2017-01-28 LAB — URINALYSIS, ROUTINE W REFLEX MICROSCOPIC
GLUCOSE, UA: 50 mg/dL — AB
HGB URINE DIPSTICK: NEGATIVE
Ketones, ur: NEGATIVE mg/dL
Leukocytes, UA: NEGATIVE
Nitrite: NEGATIVE
PH: 5 (ref 5.0–8.0)
Protein, ur: NEGATIVE mg/dL
SPECIFIC GRAVITY, URINE: 1.02 (ref 1.005–1.030)

## 2017-01-28 LAB — AMMONIA: AMMONIA: 40 umol/L — AB (ref 9–35)

## 2017-01-28 LAB — LIPASE, BLOOD: Lipase: 34 U/L (ref 11–51)

## 2017-01-28 LAB — ETHANOL: Alcohol, Ethyl (B): 300 mg/dL — ABNORMAL HIGH (ref <5)

## 2017-01-28 MED ORDER — OXYCODONE HCL 5 MG PO TABS
5.0000 mg | ORAL_TABLET | Freq: Once | ORAL | Status: AC
  Administered 2017-01-28: 5 mg via ORAL
  Filled 2017-01-28: qty 1

## 2017-01-28 MED ORDER — ALBUMIN HUMAN 25 % IV SOLN: 12.5000 g | Freq: Once | INTRAVENOUS | 0 refills | Status: AC

## 2017-01-28 NOTE — ED Triage Notes (Signed)
Pt comes in with abdominal pain starting 3-4 days ago. Pt gets paracentesis done but has missed his appts. The last two weeks because he was feeling so bad. Pt has two hernias as well. States he has felt nauseous but denies vomiting. Pt is alert but notably fatigued.

## 2017-01-28 NOTE — ED Notes (Signed)
Pt ambulatory to waiting room. Pt verbalized understanding of discharge instructions.   

## 2017-01-28 NOTE — Telephone Encounter (Signed)
LMOM for his sister, Marcha SoldersCathey, that we can see him 5/9 at 330pm and asked for her to call me back to confirm.

## 2017-01-28 NOTE — ED Provider Notes (Signed)
[facility] DE[facility] Provider Note   CSN: [id number] Arrival date & time: 01/28/17  1747     History   Chief Complaint Chief Complaint  Patient presents with  . Abdominal Pain    HPI Joseph Cortez is a 51 y.o. male.  HPI  51 year old male with a history of cirrhosis with ascites presents with abdominal pain. The patient has had progressive abdominal pain for several days, he is unclear exactly how long but his sister thinks it's been for almost a week. No fevers or vomiting. He is unable to fully localize the pain but states everywhere is hurting. He has both an inguinal hernia and an umbilical hernia, neither of these is completely swollen or irreducible. Sister also not as the patient has contacted drinking and patient states he drinks about 2 bottles of wine per day. He has had one today. This is been ongoing for the last few weeks. He was not feeling well last week so he did not get his weekly paracentesis. Has not been taking his lactulose so sister is concerned his ammonia is high. However he has not been confused besides when intoxicated.  Past Medical History:  Diagnosis Date  . AKI (acute kidney injury) (HCC) 07/14/2016  . Anemia   . Anxiety   . Arthritis   . Blood transfusion without reported diagnosis   . Cirrhosis (HCC)   . COPD (chronic obstructive pulmonary disease) (HCC)   . Depression   . Hypokalemia 07/14/2016  . Hyponatremia 07/14/2016  . Neuromuscular disorder (HCC)    Neuropathy  . Neuropathy   . Substance abuse   . Ulcer    BLEEDING VARICES, ULCERATIONS    Patient Active Problem List   Diagnosis Date Noted  . Abdominal pain 11/20/2016  . Diarrhea 11/20/2016  . Tobacco abuse 11/20/2016  . Chronic midline low back pain without sciatica 09/01/2016  . Chronic insomnia 09/01/2016  . 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 Alcoholic hepatitis with ascites 07/14/2016  . Alcohol abuse 07/14/2016  . Anxiety 07/14/2016  .  Coagulopathy (HCC) 07/14/2016  . Thrombocytopenia (HCC) 07/14/2016  . Elevated LFTs   . Esophageal varices without bleeding (HCC)   . Portal hypertensive gastropathy (HCC)   . Duodenal ulcer   . Reflux esophagitis   . Hepatic cirrhosis (HCC) 06/10/2015    Past Surgical History:  Procedure Laterality Date  . BIOPSY  06/27/2015   Procedure: BIOPSY (Gastric);  Surgeon: Corbin Adeobert M Rourk, MD;  Location: [facility] ORS;  Service: Endoscopy;;  . CHOLECYSTECTOMY    . ESOPHAGEAL BANDING N/A 01/30/2016   Procedure: ESOPHAGEAL BANDING;  Surgeon: Corbin Adeobert M Rourk, MD;  Location: [facility] ENDO SUITE;  Service: Endoscopy;  Laterality: N/A;  . ESOPHAGOGASTRODUODENOSCOPY  January 06, 2016   [facility]: duodenitis in bullb, portal gastropathy, 2 columns of large distal esophageal varices, one with flat red spot, s/p band ligation. Needs 4 week surveillance EGD   . ESOPHAGOGASTRODUODENOSCOPY (EGD) WITH PROPOFOL N/A 06/27/2015   WUJ:WJXBRMR:mild erosive reflux esophagitis, portal gastropathy, duodenal bulbar ulcer, negative H.pylori. Surveillance due yearly according to ASGE current guidelines regarding ETOH cirrhosis and ongoing ETOH abuse  . ESOPHAGOGASTRODUODENOSCOPY (EGD) WITH PROPOFOL N/A 01/30/2016   Dr. Jena Gaussourk: Grade 2 varices s/p banding with complete deflation of varices, portal gastropathy, surveillance in May 2018       Home Medications    Prior to Admission medications   Medication Sig Start Date End Date Taking? Authorizing Provider  gabapentin (NEURONTIN) 300 MG capsule Take 300-600  mg by mouth See admin instructions. 300mg  in the morning, 300mg  in the evening, and 600mg  at bedtime   Yes Historical Provider, MD  hydrOXYzine (VISTARIL) 25 MG capsule Take 1 capsule (25 mg total) by mouth 3 (three) times daily as needed. Patient taking differently: Take 25 mg by mouth 3 (three) times daily as needed for anxiety.  11/25/16  Yes Eustace Moore, MD  lactulose [facility]) 10 GM/15ML solution Take 20 g by mouth 2  (two) times daily.   Yes Historical Provider, MD  Multiple Vitamin (MULTIVITAMIN WITH MINERALS) TABS tablet Take 1 tablet by mouth daily.   Yes Historical Provider, MD  pantoprazole (PROTONIX) 40 MG tablet Take 1 tablet (40 mg total) by mouth 2 (two) times daily before a meal. 01/27/17  Yes Gelene Mink, NP  rifaximin (XIFAXAN) 550 MG TABS tablet Take 1 tablet (550 mg total) by mouth 2 (two) times daily. 01/06/17  Yes Gelene Mink, NP  albumin human 25 % bottle Inject 50 mLs (12.5 g total) into the vein once. 01/28/17 01/28/17  Pricilla Loveless, MD  furosemide (LASIX) 20 MG tablet Take 2 tablets (40 mg total) by mouth daily. Patient not taking: Reported on 01/28/2017 01/13/17   Gelene Mink, NP  methocarbamol (ROBAXIN) 750 MG tablet Take 1 tablet (750 mg total) by mouth every 6 (six) hours as needed for muscle spasms. Patient not taking: Reported on 01/28/2017 01/07/17   Eustace Moore, MD  spironolactone (ALDACTONE) 50 MG tablet Take 2 tablets (100 mg total) by mouth daily. Patient not taking: Reported on 01/28/2017 01/13/17   Gelene Mink, NP    Family History Family History  Problem Relation Age of Onset  . Colon cancer Maternal Uncle   . Alcohol abuse Maternal Uncle   . Arthritis Mother   . Cancer Mother     breast cancer  . Hypertension Father   . Hyperlipidemia Father   . Lupus Sister   . Alcohol abuse Paternal Uncle   . Alzheimer's disease Maternal Grandmother   . COPD Maternal Grandfather   . Diabetes Maternal Grandfather   . Heart disease Paternal Grandfather   . Alcohol abuse Cousin     Social History Social History  Substance Use Topics  . Smoking status: Current Every Day Smoker    Types: Cigarettes  . Smokeless tobacco: Never Used     Comment: 3-4 cigarettes daily   . Alcohol use No     Comment: 1/5th bottle wine every day; stopped drinking October 18th      Allergies   Sertraline   Review of Systems Review of Systems  Constitutional: Negative for fever.    Gastrointestinal: Positive for abdominal distention and abdominal pain. Negative for diarrhea, nausea and vomiting.  All other systems reviewed and are negative.    Physical Exam Updated Vital Signs BP 118/89 (BP Location: Left Arm)   Pulse 87   Temp 97.9 F (36.6 C) (Oral)   Resp 18   Ht 5\' 6"  (1.676 m)   Wt 210 lb (95.3 kg)   SpO2 95%   BMI 33.89 kg/m   Physical Exam  Constitutional: He is oriented to person, place, and time. He appears well-developed and well-nourished. No distress.  Mildly intoxicated  HENT:  Head: Normocephalic and atraumatic.  Right Ear: External ear normal.  Left Ear: External ear normal.  Nose: Nose normal.  Eyes: Right eye exhibits no discharge. Left eye exhibits no discharge.  Neck: Neck supple.  Cardiovascular: Normal rate, regular  rhythm and normal heart sounds.   Pulmonary/Chest: Effort normal and breath sounds normal.  Abdominal: Soft. He exhibits distension. There is tenderness (mild, generalized).  Mild periumbilical hernia R inguinal hernia that is not incarcerated, easily reduced  Musculoskeletal: He exhibits no edema.  Neurological: He is alert and oriented to person, place, and time.  Skin: Skin is warm and dry. He is not diaphoretic.  Nursing note and vitals reviewed.    ED Treatments / Results  Labs (all labs ordered are listed, but only abnormal results are displayed) Labs Reviewed  COMPREHENSIVE METABOLIC PANEL - Abnormal; Notable for the following:       Result Value   Glucose, Bld 110 (*)    BUN 5 (*)    Calcium 8.7 (*)    Albumin 3.2 (*)    AST 72 (*)    Alkaline Phosphatase 149 (*)    Total Bilirubin 1.9 (*)    All other components within normal limits  ETHANOL - Abnormal; Notable for the following:    Alcohol, Ethyl (B) 300 (*)    All other components within normal limits  CBC WITH DIFFERENTIAL/PLATELET - Abnormal; Notable for the following:    Platelets 134 (*)    All other components within normal limits   URINALYSIS, ROUTINE W REFLEX MICROSCOPIC - Abnormal; Notable for the following:    Color, Urine AMBER (*)    Glucose, UA 50 (*)    Bilirubin Urine SMALL (*)    All other components within normal limits  AMMONIA - Abnormal; Notable for the following:    Ammonia 40 (*)    All other components within normal limits  LIPASE, BLOOD    EKG  EKG Interpretation None       Radiology No results found.  Procedures Procedures (including critical care time)  Medications Ordered in ED Medications  oxyCODONE (Oxy IR/ROXICODONE) immediate release tablet 5 mg (5 mg Oral Given 01/28/17 2041)     Initial Impression / Assessment and Plan / ED Course  I have reviewed the triage vital signs and the nursing notes.  Pertinent labs & imaging results that were available during my care of the patient were reviewed by me and considered in my medical decision making (see chart for details).     At this time of night, radiology is unavailable to do a full, therapeutic paracentesis. I have offered to do a diagnostic paracentesis to help rule out SBP given his abdominal pain. Although I do think this is less likely with no fevers, abnormal vital signs, normal white blood cell count, etc. This is been an indolent process that is likely related to too much fluid. After multiple discussions with patient and sister, he wants to be discharged to follow-up with radiology tomorrow where he has a standing order for paracentesis. I discussed the possibility of missed diagnosis of SBP as well as potential harms of this including sepsis and death. He seems to understand. He has been observed in the ER and appears clinically sober at this time and is capable of making medical decisions along with his sister at the bedside. Given I have a low concern for SBP otherwise I think it is reasonable to be discharged without a tap tonight. Strict return precautions.  Final Clinical Impressions(s) / ED Diagnoses   Final  diagnoses:  Generalized abdominal pain  Ascites due to alcoholic cirrhosis (HCC)  Alcoholic intoxication without complication [facility])    New Prescriptions    Pricilla Loveless, MD 01/28/17 2109

## 2017-01-29 ENCOUNTER — Encounter (HOSPITAL_COMMUNITY): Payer: Self-pay

## 2017-01-29 ENCOUNTER — Ambulatory Visit (HOSPITAL_COMMUNITY)
Admission: RE | Admit: 2017-01-29 | Discharge: 2017-01-29 | Disposition: A | Discharge status: Home / Self Care | Payer: Medicaid - Out of State | Source: Ambulatory Visit | Attending: Gastroenterology | Admitting: Gastroenterology

## 2017-01-29 DIAGNOSIS — R188 Other ascites: Secondary | ICD-10-CM

## 2017-01-29 DIAGNOSIS — K7031 Alcoholic cirrhosis of liver with ascites: Secondary | ICD-10-CM | POA: Insufficient documentation

## 2017-01-29 NOTE — Progress Notes (Signed)
Pt declined IV and Albumin

## 2017-01-29 NOTE — Procedures (Signed)
PreOperative Dx: Alcoholic cirrhosis, ascites Postoperative Dx: Alcoholic cirrhosis, ascites Procedure:   US guided paracentesis Radiologist:  Jermesha Sottile Anesthesia:  10 ml of1% lidocaine Specimen:  7.1 L of yellow ascitic fluid EBL:   < 1 ml Complications: None  

## 2017-01-29 NOTE — Progress Notes (Signed)
Paracentesis complete no signs of distress. 7.1 yellow colored ascites removed.

## 2017-02-02 ENCOUNTER — Ambulatory Visit (INDEPENDENT_AMBULATORY_CARE_PROVIDER_SITE_OTHER): Payer: Self-pay | Admitting: General Surgery

## 2017-02-02 DIAGNOSIS — K429 Umbilical hernia without obstruction or gangrene: Secondary | ICD-10-CM

## 2017-02-02 NOTE — Progress Notes (Signed)
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 Delton See for an umbilical hernia and a right inguinal hernia. He has not had an episode of incarceration of either hernia. He states that his hernia seemed to be hurting right before he needs paracentesis which she undergoes weekly. He continues to drink approximately 2 bottles of wine a day. He denies any emesis. He states the hernias have been present for some time. He currently states he has a 8 out of 10 pain. He has had bleeding varices treated in the past. Past Medical History:  Diagnosis Date  . AKI (acute kidney injury) (HCC) 07/14/2016  . Anemia   . Anxiety   . Arthritis   . Blood transfusion without reported diagnosis   . Cirrhosis (HCC)   . COPD (chronic obstructive pulmonary disease) (HCC)   . Depression   . Hypokalemia 07/14/2016  . Hyponatremia 07/14/2016  . Neuromuscular disorder (HCC)    Neuropathy  . Neuropathy   . Substance abuse   . Ulcer    BLEEDING VARICES, ULCERATIONS    Past Surgical History:  Procedure Laterality Date  . BIOPSY  06/27/2015   Procedure: BIOPSY (Gastric);  Surgeon: Corbin Ade, MD;  Location: [facility];  Service: Endoscopy;;  . CHOLECYSTECTOMY    . ESOPHAGEAL BANDING N/A 01/30/2016   Procedure: ESOPHAGEAL BANDING;  Surgeon: Corbin Ade, MD;  Location: [facility];  Service: Endoscopy;  Laterality: N/A;  . ESOPHAGOGASTRODUODENOSCOPY  January 06, 2016   Dr. Alycia Rossetti at [facility]: duodenitis in bullb, portal gastropathy, 2 columns of large distal esophageal varices, one with flat red spot, s/p band ligation. Needs 4 week surveillance EGD   . ESOPHAGOGASTRODUODENOSCOPY (EGD) WITH PROPOFOL N/A 06/27/2015   WGN:FAOZ erosive reflux esophagitis, portal gastropathy, duodenal bulbar ulcer, negative H.pylori. Surveillance due yearly  according to ASGE current guidelines regarding ETOH cirrhosis and ongoing ETOH abuse  . ESOPHAGOGASTRODUODENOSCOPY (EGD) WITH PROPOFOL N/A 01/30/2016   Dr. Jena Gauss: Grade 2 varices s/p banding with complete deflation of varices, portal gastropathy, surveillance in May 2018    Family History  Problem Relation Age of Onset  . Colon cancer Maternal Uncle   . Alcohol abuse Maternal Uncle   . Arthritis Mother   . Cancer Mother     breast cancer  . Hypertension Father   . Hyperlipidemia Father   . Lupus Sister   . Alcohol abuse Paternal Uncle   . Alzheimer's disease Maternal Grandmother   . COPD Maternal Grandfather   . Diabetes Maternal Grandfather   . Heart disease Paternal Grandfather   . Alcohol abuse Cousin     Current Outpatient Prescriptions on File Prior to Visit  Medication Sig Dispense Refill  . furosemide (LASIX) 20 MG tablet Take 2 tablets (40 mg total) by mouth daily. (Patient not taking: Reported on 01/28/2017) 60 tablet 3  . gabapentin (NEURONTIN) 300 MG capsule Take 300-600 mg by mouth See admin instructions. 300mg  in the morning, 300mg  in the evening, and 600mg  at bedtime    . hydrOXYzine (VISTARIL) 25 MG capsule Take 1 capsule (25 mg total) by mouth 3 (three) times daily as needed. (Patient taking differently: Take 25 mg by mouth 3 (three) times daily as needed for anxiety. ) 30 capsule 0  . lactulose (CHRONULAC) 10 GM/15ML solution Take 20 g by mouth 2 (two) times daily.    Marland Kitchen  methocarbamol (ROBAXIN) 750 MG tablet Take 1 tablet (750 mg total) by mouth every 6 (six) hours as needed for muscle spasms. (Patient not taking: Reported on 01/28/2017) 100 tablet 0  . Multiple Vitamin (MULTIVITAMIN WITH MINERALS) TABS tablet Take 1 tablet by mouth daily.    . pantoprazole (PROTONIX) 40 MG tablet Take 1 tablet (40 mg total) by mouth 2 (two) times daily before a meal. 180 tablet 3  . rifaximin (XIFAXAN) 550 MG TABS tablet Take 1 tablet (550 mg total) by mouth 2 (two) times daily. 180 tablet  3  . spironolactone (ALDACTONE) 50 MG tablet Take 2 tablets (100 mg total) by mouth daily. (Patient not taking: Reported on 01/28/2017) 60 tablet 3   No current facility-administered medications on file prior to visit.     Allergies  Allergen Reactions  . Sertraline Other (See Comments)    Suicidal ideations    History  Alcohol Use No    Comment: 1/5th bottle wine every day; stopped drinking October 18th     History  Smoking Status  . Current Every Day Smoker  . Types: Cigarettes  Smokeless Tobacco  . Never Used    Comment: 3-4 cigarettes daily     Review of Systems  Constitutional: Positive for malaise/fatigue.  HENT: Negative.   Eyes: Negative.   Respiratory: Negative.   Cardiovascular: Negative.   Gastrointestinal: Positive for abdominal pain, heartburn and nausea.  Genitourinary: Negative.   Musculoskeletal: Negative.   Skin: Negative.   Neurological: Negative.   Endo/Heme/Allergies: Negative.   Psychiatric/Behavioral: Positive for depression and substance abuse.    Objective   There were no vitals filed for this visit.  Physical Exam  Constitutional: He is oriented to person, place, and time. No distress.  HENT:  Head: Normocephalic and atraumatic.  Neck: Normal range of motion. Neck supple.  Cardiovascular: Normal rate, regular rhythm and normal heart sounds.   No murmur heard. Pulmonary/Chest: Effort normal and breath sounds normal. He has no wheezes. He has no rales.  Abdominal: Bowel sounds are normal. He exhibits distension. There is tenderness. There is no rebound.  Large proton and somewhat tense abdomen with a 3 cm umbilical hernia present which is easily reducible. The skin overlying the umbilical hernia is thin but not erythematous. A reducible right inguinal hernia is also present, but smaller in nature. No rigidity is noted.  Neurological: He is alert and oriented to person, place, and time.  Vitals reviewed.   Assessment  Uncompensated  alcoholic cirrhosis with ascites Umbilical hernia Right inguinal hernia Plan   I told the patient and family member that performing an umbilical herniorrhaphy with mesh was a very high risk to him for failure, infection, bleeding, and death as he has uncompensated alcoholic cirrhosis. He continues to drink. Should he want further evaluation, this would need to be done at a tertiary care center which handles liver failure patients. He understands and agrees. I did encourage him to try to abstain from alcohol use, but the family member states that this has been tried in the past and he feels that stop and alcohol at this point in life is futile.

## 2017-02-02 NOTE — Patient Instructions (Signed)
Umbilical Hernia, Adult  A hernia is a bulge of tissue that pushes through an opening between muscles. An umbilical hernia happens in the abdomen, near the belly button (umbilicus). The hernia may contain tissues from the small intestine, large intestine, or fatty tissue covering the intestines (omentum). Umbilical hernias in adults tend to get worse over time, and they require surgical treatment.  There are several types of umbilical hernias. You may have:  · A hernia located just above or below the umbilicus (indirect hernia). This is the most common type of umbilical hernia in adults.  · A hernia that forms through an opening formed by the umbilicus (direct hernia).  · A hernia that comes and goes (reducible hernia). A reducible hernia may be visible only when you strain, lift something heavy, or cough. This type of hernia can be pushed back into the abdomen (reduced).  · A hernia that traps abdominal tissue inside the hernia (incarcerated hernia). This type of hernia cannot be reduced.  · A hernia that cuts off blood flow to the tissues inside the hernia (strangulated hernia). The tissues can start to die if this happens. This type of hernia requires emergency treatment.    What are the causes?  An umbilical hernia happens when tissue inside the abdomen presses on a weak area of the abdominal muscles.  What increases the risk?  You may have a greater risk of this condition if you:  · Are obese.  · Have had several pregnancies.  · Have a buildup of fluid inside your abdomen (ascites).  · Have had surgery that weakens the abdominal muscles.    What are the signs or symptoms?  The main symptom of this condition is a painless bulge at or near the belly button. A reducible hernia may be visible only when you strain, lift something heavy, or cough. Other symptoms may include:  · Dull pain.  · A feeling of pressure.    Symptoms of a strangulated hernia may include:  · Pain that gets increasingly worse.  · Nausea and  vomiting.  · Pain when pressing on the hernia.  · Skin over the hernia becoming red or purple.  · Constipation.  · Blood in the stool.    How is this diagnosed?  This condition may be diagnosed based on:  · A physical exam. You may be asked to cough or strain while standing. These actions increase the pressure inside your abdomen and force the hernia through the opening in your muscles. Your health care provider may try to reduce the hernia by pressing on it.  · Your symptoms and medical history.    How is this treated?  Surgery is the only treatment for an umbilical hernia. Surgery for a strangulated hernia is done as soon as possible. If you have a small hernia that is not incarcerated, you may need to lose weight before having surgery.  Follow these instructions at home:  · Lose weight, if told by your health care provider.  · Do not try to push the hernia back in.  · Watch your hernia for any changes in color or size. Tell your health care provider if any changes occur.  · You may need to avoid activities that increase pressure on your hernia.  · Do not lift anything that is heavier than 10 lb (4.5 kg) until your health care provider says that this is safe.  · Take over-the-counter and prescription medicines only as told by your health care provider.  ·   Keep all follow-up visits as told by your health care provider. This is important.  Contact a health care provider if:  · Your hernia gets larger.  · Your hernia becomes painful.  Get help right away if:  · You develop sudden, severe pain near the area of your hernia.  · You have pain as well as nausea or vomiting.  · You have pain and the skin over your hernia changes color.  · You develop a fever.  This information is not intended to replace advice given to you by your health care provider. Make sure you discuss any questions you have with your health care provider.  Document Released: 02/14/2016 Document Revised: 05/17/2016 Document Reviewed:  02/14/2016  Elsevier Interactive Patient Education © 2017 Elsevier Inc.

## 2017-02-02 NOTE — Progress Notes (Signed)
Referring Provider: Eustace MooreNelson, Yvonne Sue, MD Primary Care Physician:  Eustace MooreNelson, Yvonne Sue, MD Primary GI: Dr. Jena Gaussourk   Chief Complaint  Patient presents with  . Diarrhea    without taking Lactulose  . Abdominal Pain    lower abd  . Cirrhosis    HPI:   Joseph Cortez is a 51 y.o. male presenting today with a history of ETOH cirrhosis, decompensated, end-stage. He had briefly stopped drinking for 4 months, noted at last visit December 28, 2016. Unfortunately, he has started drinking again. EGD for surveillance was scheduled, but his sister cancelled this due to relapse in ETOH. Colonoscopy was also planned due to low-volume hematochezia, but this was cancelled with EGD.  He is requiring almost weekly if not more often paracenteses, and he has not been taking diuretics consistently. However, he has had worsening ascites even with diuretics historically. No evidence of PVT on recent CT. He has not been on a non-selective beta-blocker due to intolerance with hypotension. Unfortunately, he has also not been compliant with lactulose therapy historically.  He also had evidence of possible colitis on CT about a month ago, which is non-specific in setting of ascites. He has been evaluated for umbilical hernia and inguinal hernia repair by Dr. Lovell SheehanJenkins, but he is not a candidate for surgery due to risk of adverse outcomes in light of his advanced liver disease and would need to have done at a tertiary care facility.   Drank about a 1/5th of wine yesterday. Diarrhea yesterday. Poor appetite. Will go without eating at times and only drink alcohol.   Not taking: lasix, aldactone, lactulose, xifaxan. Sister requesting referral to an orthopedic that they have seen historically in [place].    Past Medical History:  Diagnosis Date  . AKI (acute kidney injury) (HCC) 07/14/2016  . Anemia   . Anxiety   . Arthritis   . Blood transfusion without reported diagnosis   . Cirrhosis (HCC)   . COPD (chronic  obstructive pulmonary disease) (HCC)   . Depression   . Hypokalemia 07/14/2016  . Hyponatremia 07/14/2016  . Neuromuscular disorder (HCC)    Neuropathy  . Neuropathy   . Substance abuse   . Ulcer    BLEEDING VARICES, ULCERATIONS    Past Surgical History:  Procedure Laterality Date  . BIOPSY  06/27/2015   Procedure: BIOPSY (Gastric);  Surgeon: Corbin Adeobert M Rourk, MD;  Location: [facility];  Service: Endoscopy;;  . CHOLECYSTECTOMY    . ESOPHAGEAL BANDING N/A 01/30/2016   Procedure: ESOPHAGEAL BANDING;  Surgeon: Corbin Adeobert M Rourk, MD;  Location: [facility];  Service: Endoscopy;  Laterality: N/A;  . ESOPHAGOGASTRODUODENOSCOPY  January 06, 2016   [facility]: duodenitis in bullb, portal gastropathy, 2 columns of large distal esophageal varices, one with flat red spot, s/p band ligation. Needs 4 week surveillance EGD   . ESOPHAGOGASTRODUODENOSCOPY (EGD) WITH PROPOFOL N/A 06/27/2015   ZOX:WRUERMR:mild erosive reflux esophagitis, portal gastropathy, duodenal bulbar ulcer, negative H.pylori. Surveillance due yearly according to ASGE current guidelines regarding ETOH cirrhosis and ongoing ETOH abuse  . ESOPHAGOGASTRODUODENOSCOPY (EGD) WITH PROPOFOL N/A 01/30/2016   Dr. Jena Gaussourk: Grade 2 varices s/p banding with complete deflation of varices, portal gastropathy, surveillance in May 2018    Current Outpatient Prescriptions  Medication Sig Dispense Refill  . bismuth subsalicylate (PEPTO BISMOL) 262 MG/15ML suspension Take 30 mLs by mouth every 6 (six) hours as needed.    . hydrOXYzine (VISTARIL) 25 MG capsule Take 1 capsule (25 mg  total) by mouth 3 (three) times daily as needed. (Patient taking differently: Take 25 mg by mouth 3 (three) times daily as needed for anxiety. ) 30 capsule 0  . Multiple Vitamin (MULTIVITAMIN WITH MINERALS) TABS tablet Take 1 tablet by mouth daily.    . pantoprazole (PROTONIX) 40 MG tablet Take 1 tablet (40 mg total) by mouth 2 (two) times daily before a meal. (Patient taking differently:  Take 40 mg by mouth 2 (two) times daily before a meal. Sometimes takes once a day) 180 tablet 3  . furosemide (LASIX) 20 MG tablet Take 2 tablets (40 mg total) by mouth daily. (Patient not taking: Reported on 01/28/2017) 60 tablet 3  . gabapentin (NEURONTIN) 300 MG capsule Take 300-600 mg by mouth See admin instructions. 300mg  in the morning, 300mg  in the evening, and 600mg  at bedtime. Not taking every day    . lactulose (CHRONULAC) 10 GM/15ML solution Take 20 g by mouth 2 (two) times daily.    . methocarbamol (ROBAXIN) 750 MG tablet Take 1 tablet (750 mg total) by mouth every 6 (six) hours as needed for muscle spasms. (Patient not taking: Reported on 01/28/2017) 100 tablet 0  . rifaximin (XIFAXAN) 550 MG TABS tablet Take 1 tablet (550 mg total) by mouth 2 (two) times daily. (Patient not taking: Reported on 02/03/2017) 180 tablet 3  . spironolactone (ALDACTONE) 50 MG tablet Take 2 tablets (100 mg total) by mouth daily. (Patient not taking: Reported on 01/28/2017) 60 tablet 3   No current facility-administered medications for this visit.     Allergies as of 02/03/2017 - Review Complete 02/03/2017  Allergen Reaction Noted  . Sertraline Other (See Comments) 01/07/2016    Family History  Problem Relation Age of Onset  . Colon cancer Maternal Uncle   . Alcohol abuse Maternal Uncle   . Arthritis Mother   . Cancer Mother     breast cancer  . Hypertension Father   . Hyperlipidemia Father   . Lupus Sister   . Alcohol abuse Paternal Uncle   . Alzheimer's disease Maternal Grandmother   . COPD Maternal Grandfather   . Diabetes Maternal Grandfather   . Heart disease Paternal Grandfather   . Alcohol abuse Cousin     Social History   Social History  . Marital status: Divorced    Spouse name: N/A  . Number of children: 2  . Years of education: 11   Occupational History  . disability     former truck driver   Social History Main Topics  . Smoking status: Current Every Day Smoker    Types:  Cigarettes  . Smokeless tobacco: Never Used     Comment: 3-4 cigarettes daily   . Alcohol use 16.8 oz/week    28 Glasses of wine per week     Comment: alcohol yesterday  . Drug use: No     Comment: cocaine historically, several years ago  . Sexual activity: Yes    Birth control/ protection: None   Other Topics Concern  . None   Social History Narrative   Lives alone   Lives near sister   No reg exercise   Disabled for 3 years    Review of Systems: As mentioned in HPI.   Physical Exam: BP 107/69   Pulse 90   Temp 97 F (36.1 C) (Oral)   Ht 5\' 8"  (1.727 m)   Wt 198 lb 12.8 oz (90.2 kg)   BMI 30.23 kg/m  General:   Alert and oriented.  No distress noted. Appears chronically ill.  Head:  Normocephalic and atraumatic. Eyes:  Mild scleral icterus.  Mouth:  Oral mucosa pink and moist Heart:  S1, S2 present without murmurs Abdomen:  +BS, moderately tense ascites, non-tender, umbilical hernia noted.   Msk:  Symmetrical without gross deformities. Normal posture. Extremities:  Without edema. Neurologic:  Alert and  oriented x4 Skin: Sallow-appearing Psych:  Alert and cooperative. Normal mood and affect.  Lab Results  Component Value Date   WBC 7.9 01/28/2017   HGB 14.7 01/28/2017   HCT 41.9 01/28/2017   MCV 92.3 01/28/2017   PLT 134 (L) 01/28/2017   Lab Results  Component Value Date   ALT 26 01/28/2017   AST 72 (H) 01/28/2017   ALKPHOS 149 (H) 01/28/2017   BILITOT 1.9 (H) 01/28/2017   Lab Results  Component Value Date   CREATININE 0.62 01/28/2017   BUN 5 (L) 01/28/2017   NA 138 01/28/2017   K 3.6 01/28/2017   CL 105 01/28/2017   CO2 24 01/28/2017

## 2017-02-03 ENCOUNTER — Encounter: Payer: Self-pay | Admitting: Gastroenterology

## 2017-02-03 ENCOUNTER — Ambulatory Visit (INDEPENDENT_AMBULATORY_CARE_PROVIDER_SITE_OTHER): Payer: Medicaid - Out of State | Admitting: Gastroenterology

## 2017-02-03 ENCOUNTER — Other Ambulatory Visit: Payer: Self-pay

## 2017-02-03 VITALS — BP 107/69 | HR 90 | Temp 97.0°F | Ht 68.0 in | Wt 198.8 lb

## 2017-02-03 DIAGNOSIS — I8500 Esophageal varices without bleeding: Secondary | ICD-10-CM

## 2017-02-03 DIAGNOSIS — K7031 Alcoholic cirrhosis of liver with ascites: Secondary | ICD-10-CM

## 2017-02-03 DIAGNOSIS — R188 Other ascites: Secondary | ICD-10-CM

## 2017-02-03 MED ORDER — SPIRONOLACTONE 50 MG PO TABS: 100.0000 mg | ORAL_TABLET | Freq: Every day | ORAL | 3 refills | Status: DC

## 2017-02-03 MED ORDER — FUROSEMIDE 20 MG PO TABS: 40.0000 mg | ORAL_TABLET | Freq: Every day | ORAL | 3 refills | Status: DC

## 2017-02-03 MED ORDER — ALBUMIN HUMAN 25 % IV SOLN: INTRAVENOUS | 0 refills | Status: DC

## 2017-02-03 NOTE — Patient Instructions (Signed)
We have arranged an upper endoscopy with Dr. Jena Gaussourk.   I have printed a letter regarding the hernia.   I am also referring to Dr. Rico JunkerJonathan Carmouche.

## 2017-02-04 ENCOUNTER — Ambulatory Visit (HOSPITAL_COMMUNITY): Admission: RE | Admit: 2017-02-04 | Payer: Medicaid - Out of State | Source: Ambulatory Visit

## 2017-02-05 ENCOUNTER — Ambulatory Visit (HOSPITAL_COMMUNITY)
Admission: RE | Admit: 2017-02-05 | Discharge: 2017-02-05 | Disposition: A | Discharge status: Home / Self Care | Payer: Medicaid - Out of State | Source: Ambulatory Visit | Attending: Gastroenterology | Admitting: Gastroenterology

## 2017-02-05 ENCOUNTER — Encounter (HOSPITAL_COMMUNITY): Payer: Self-pay

## 2017-02-05 DIAGNOSIS — R188 Other ascites: Secondary | ICD-10-CM

## 2017-02-05 DIAGNOSIS — K7031 Alcoholic cirrhosis of liver with ascites: Secondary | ICD-10-CM | POA: Insufficient documentation

## 2017-02-05 MED ORDER — ALBUMIN HUMAN 25 % IV SOLN
INTRAVENOUS | Status: AC
  Filled 2017-02-05: qty 200

## 2017-02-05 MED ORDER — ALBUMIN HUMAN 25 % IV SOLN
25.0000 g | Freq: Once | INTRAVENOUS | Status: AC
  Administered 2017-02-05: 25 g via INTRAVENOUS

## 2017-02-05 MED ORDER — ALBUMIN HUMAN 25 % IV SOLN: 12.5000 g | Freq: Once | INTRAVENOUS | Status: DC

## 2017-02-05 NOTE — Assessment & Plan Note (Signed)
51 year old male with ETOH cirrhosis, need for weekly paracenteses, abstaining from ETOH for four months but now returning to drink sporadically. Due for variceal surveillance and has not been on non-selective beta-blocker due to intolerance with hypotension. Not compliant with lactulose and Xifaxan, and he is sporadically taking diuretics. I discussed the futility of continuing to drink and take diuretics. Discussed at length avoidance of alcohol and returning to see psychiatry. Discussed poor prognosis if continuing to drink, and his sister was present with him today. Both are fully aware that he will need to abstain from alcohol if going to proceed with an elective procedure. He states understanding.   Proceed with upper endoscopy in the near future with Dr. Jena Gaussourk. The risks, benefits, and alternatives have been discussed in detail with patient. They have stated understanding and desire to proceed.  PROPOFOL due to history of ETOH abuse Recommend Lactulose and Xifaxan Continue with paracentesis as needed Continue with outpatient palliative care team

## 2017-02-05 NOTE — Sedation Documentation (Addendum)
Pt for OP paracentesis.  Pt lethargic.  States he think he accidentally took an extra  muscle relaxer.  States his last drink was yesterday.

## 2017-02-05 NOTE — Sedation Documentation (Signed)
Stopped at 4 liters.  Per Dr. Tyron RussellBoles,  To recheck blood pressure.

## 2017-02-05 NOTE — Sedation Documentation (Signed)
Pt up ambulatory to wheelchair without difficulty.

## 2017-02-05 NOTE — Procedures (Signed)
PreOperative Dx: Alcoholic cirrhosis, ascites Postoperative Dx: Alcoholic cirrhosis, ascites Procedure:   US guided paracentesis Radiologist:  Berlin Cohick Anesthesia:  10 ml of 1% lidocaine Specimen:  6 L of yellow ascitic fluid EBL:   < 1 ml Complications:  None  

## 2017-02-05 NOTE — Sedation Documentation (Signed)
Dr. Tyron RussellBoles at bedside.  Ok to go to 6 liters.

## 2017-02-05 NOTE — Discharge Instructions (Signed)
Paracentesis, Care After  Refer to this sheet in the next few weeks. These instructions provide you with information about caring for yourself after your procedure. Your health care provider may also give you more specific instructions. Your treatment has been planned according to current medical practices, but problems sometimes occur. Call your health care provider if you have any problems or questions after your procedure.  What can I expect after the procedure?  After your procedure, it is common to have a small amount of clear fluid coming from the puncture site.  Follow these instructions at home:  · Return to your normal activities as told by your health care provider. Ask your health care provider what activities are safe for you.  · Take over-the-counter and prescription medicines only as told by your health care provider.  · Do not take baths, swim, or use a hot tub until your health care provider approves.  · Follow instructions from your health care provider about:  ? How to take care of your puncture site.  ? When and how you should change your bandage (dressing).  ? When you should remove your dressing.  · Check your puncture area every day signs of infection. Watch for:  ? Redness, swelling, or pain.  ? Fluid, blood, or pus.  · Keep all follow-up visits as told by your health care provider. This is important.  Contact a health care provider if:  · You have redness, swelling, or pain at your puncture site.  · You start to have more clear fluid coming from your puncture site.  · You have blood or pus coming from your puncture site.  · You have chills.  · You have a fever.  Get help right away if:  · You develop chest pain or shortness of breath.  · You develop increasing pain, discomfort, or swelling in your abdomen.  · You feel dizzy or light-headed or you pass out.  This information is not intended to replace advice given to you by your health care provider. Make sure you discuss any questions you  have with your health care provider.  Document Released: 01/29/2015 Document Revised: 02/20/2016 Document Reviewed: 11/27/2014  Elsevier Interactive Patient Education © 2017 Elsevier Inc.

## 2017-02-05 NOTE — Sedation Documentation (Signed)
Pt refusing IV access/ albumin.

## 2017-02-05 NOTE — Sedation Documentation (Signed)
Pt finished.  6 liters fluid removed.

## 2017-02-05 NOTE — Sedation Documentation (Signed)
Dr. Tyron RussellBoles aware of b/p.  Spoke with pt and pt consented to IV and albumin.  IV access obtained and albumin started.

## 2017-02-08 NOTE — Progress Notes (Signed)
cc'ed to pcp °

## 2017-02-10 ENCOUNTER — Telehealth: Payer: Self-pay

## 2017-02-10 NOTE — Telephone Encounter (Signed)
Think he can be seen at 1040 tomorrow

## 2017-02-10 NOTE — Telephone Encounter (Signed)
States his BP has been very low, 84/64, 74/54 with heart rate of 55. Seems out of it. Wants him to be seen soon, tomorrow if possible. Please advise

## 2017-02-11 ENCOUNTER — Encounter: Payer: Self-pay | Admitting: Family Medicine

## 2017-02-11 ENCOUNTER — Ambulatory Visit (INDEPENDENT_AMBULATORY_CARE_PROVIDER_SITE_OTHER): Payer: Medicaid - Out of State | Admitting: Family Medicine

## 2017-02-11 ENCOUNTER — Telehealth: Payer: Self-pay | Admitting: Family Medicine

## 2017-02-11 VITALS — BP 108/66 | HR 76 | Temp 99.3°F | Resp 16 | Ht 68.0 in | Wt 208.1 lb

## 2017-02-11 DIAGNOSIS — K7031 Alcoholic cirrhosis of liver with ascites: Secondary | ICD-10-CM | POA: Diagnosis not present

## 2017-02-11 DIAGNOSIS — K4090 Unilateral inguinal hernia, without obstruction or gangrene, not specified as recurrent: Secondary | ICD-10-CM | POA: Diagnosis not present

## 2017-02-11 DIAGNOSIS — Z720 Tobacco use: Secondary | ICD-10-CM | POA: Diagnosis not present

## 2017-02-11 DIAGNOSIS — G8929 Other chronic pain: Secondary | ICD-10-CM | POA: Diagnosis not present

## 2017-02-11 DIAGNOSIS — K429 Umbilical hernia without obstruction or gangrene: Secondary | ICD-10-CM

## 2017-02-11 DIAGNOSIS — M545 Low back pain, unspecified: Secondary | ICD-10-CM

## 2017-02-11 NOTE — Telephone Encounter (Signed)
Patient is requesting pain medication for his back

## 2017-02-11 NOTE — Telephone Encounter (Signed)
Patient calling again to find out what the status is of his pain Rx for his back.

## 2017-02-11 NOTE — Progress Notes (Signed)
Chief Complaint  Patient presents with  . Hypotension    per pt  Patient went through a period where he started drinking alcohol again. He decided that his life was worth living and he might as well drink. He is frustrated at his inability to work and perform activities, and his disabled status. In spite of this he has not been successful in obtaining disability. He is trying to pursue disability for his inguinal and umbilical hernia, and his chronic low back pain. It is difficult because he lives in [place], and in the state of [place] he cannot become disabled for any alcohol-related diagnoses. Unfortunately his umbilical and likely inguinal hernia are due to the increased abdominal pressure from his ascites/cirrhosis/drinking. He wants to be referred to a back specialist. I explained to him before that I don't know what they can offer him. He is very limited in the available medications. He cannot (should not) take any mood altering substances. He has very little liver function. He did not comply with physical therapy. He has only mild degenerative disc disease on his MRI and subsequent x-ray. He has central, likely mechanical, low back pain with no radiation. He tells me he would like to the back specialist for 2 reasons. One he wants a back specialist to say he is disabled. 2 he wants to see what other treatment was available for him. He is not a surgical candidate. I don't think injections are safe given his thrombocytopenia and easy bleeding/elevated INR. I can provide medicines and physical therapy. He has repeatedly asked for specialist, and is not happy with my answer, I therefore will get an opinion of an orthopedic. He has been having spells of low blood pressure. The measured as low as 77/50. This makes him tired. He feels dizzy when he stands up. His fluid balance is delicate, with his ascites, frequent paracentesis, need for diuretics, and high salt diet it is not surprising that he  would have swings of hypotension.   Patient Active Problem List   Diagnosis Date Noted  . Abdominal pain 11/20/2016  . Diarrhea 11/20/2016  . Tobacco abuse 11/20/2016  . Chronic midline low back pain without sciatica 09/01/2016  . Chronic insomnia 09/01/2016  . 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 Alcoholic hepatitis with ascites 07/14/2016  . Alcohol abuse 07/14/2016  . Anxiety 07/14/2016  . Coagulopathy (HCC) 07/14/2016  . Thrombocytopenia (HCC) 07/14/2016  . Elevated LFTs   . Esophageal varices without bleeding (HCC)   . Portal hypertensive gastropathy (HCC)   . Duodenal ulcer   . Reflux esophagitis   . Hepatic cirrhosis (HCC) 06/10/2015    Outpatient Encounter Prescriptions as of 02/11/2017  Medication Sig  . bismuth subsalicylate (PEPTO BISMOL) 262 MG/15ML suspension Take 30 mLs by mouth every 6 (six) hours as needed.  . furosemide (LASIX) 20 MG tablet Take 2 tablets (40 mg total) by mouth daily.  Marland Kitchen gabapentin (NEURONTIN) 300 MG capsule Take 300-600 mg by mouth See admin instructions. 300mg  in the morning, 300mg  in the evening, and 600mg  at bedtime. Not taking every day  . hydrOXYzine (VISTARIL) 25 MG capsule Take 1 capsule (25 mg total) by mouth 3 (three) times daily as needed. (Patient taking differently: Take 25 mg by mouth 3 (three) times daily as needed for anxiety. )  . lactulose (CHRONULAC) 10 GM/15ML solution Take 20 g by mouth 2 (two) times daily.  . Multiple Vitamin (MULTIVITAMIN WITH MINERALS) TABS tablet Take 1 tablet by  mouth daily.  . pantoprazole (PROTONIX) 40 MG tablet Take 1 tablet (40 mg total) by mouth 2 (two) times daily before a meal. (Patient taking differently: Take 40 mg by mouth 2 (two) times daily before a meal. Sometimes takes once a day)  . spironolactone (ALDACTONE) 50 MG tablet Take 2 tablets (100 mg total) by mouth daily.  . methocarbamol (ROBAXIN) 750 MG tablet Take 1 tablet (750 mg total) by mouth every 6 (six) hours as  needed for muscle spasms. (Patient not taking: Reported on 02/11/2017)  . rifaximin (XIFAXAN) 550 MG TABS tablet Take 1 tablet (550 mg total) by mouth 2 (two) times daily. (Patient not taking: Reported on 02/11/2017)   No facility-administered encounter medications on file as of 02/11/2017.     Allergies  Allergen Reactions  . Sertraline Other (See Comments)    Suicidal ideations    Review of Systems  Constitutional: Positive for appetite change and fatigue. Negative for fever.  HENT: Positive for dental problem. Negative for congestion and nosebleeds.   Eyes: Negative for visual disturbance.       Eyes are yellow  Respiratory: Negative for shortness of breath.   Cardiovascular: Positive for leg swelling. Negative for palpitations.       Occasional  Gastrointestinal: Positive for abdominal distention, abdominal pain, diarrhea and nausea. Negative for blood in stool and vomiting.       Umbilical and groin swelling  Genitourinary: Negative for difficulty urinating.  Musculoskeletal: Positive for back pain.       Central, daily  Skin: Positive for color change.  Neurological: Negative for dizziness, seizures and headaches.  Hematological: Bruises/bleeds easily.  Psychiatric/Behavioral: Positive for decreased concentration and sleep disturbance. Negative for agitation, behavioral problems and confusion. The patient is nervous/anxious.        Under care of psychiatry      BP 108/66 (BP Location: Right Arm, Patient Position: Sitting, Cuff Size: Normal)   Pulse 76   Temp 99.3 F (37.4 C) (Temporal)   Resp 16   Ht 5\' 8"  (1.727 m)   Wt 208 lb 1.9 oz (94.4 kg)   SpO2 100%   BMI 31.64 kg/m   Physical Exam  Constitutional: He is oriented to person, place, and time. He appears well-developed and well-nourished.  Pleasant. Ascites. Jaundice.  HENT:  Head: Normocephalic and atraumatic.  Mouth/Throat: Oropharynx is clear and moist.  Eyes: Conjunctivae are normal. Pupils are equal,  round, and reactive to light. Scleral icterus is present.  Neck: Normal range of motion. Neck supple.  Cardiovascular: Normal rate, regular rhythm and normal heart sounds.   Pulmonary/Chest: Effort normal and breath sounds normal. No respiratory distress.  Abdominal: Soft. Bowel sounds are normal. He exhibits distension. There is no tenderness. A hernia is present.  Patient is a soft reducible umbilical hernia.   Musculoskeletal: Normal range of motion. He exhibits no edema.  Patient has mild tenderness in the central low back. No palpable muscle spasm. Very limited range of motion. Strength sensation and range of motion reflexes are symmetric in both lower extremities. No focal neurologic findings.  Lymphadenopathy:    He has no cervical adenopathy.  Neurological: He is alert and oriented to person, place, and time.  Gait normal  Skin: Skin is warm and dry.  Psychiatric: He has a normal mood and affect. His behavior is normal. Thought content normal.  Nursing note and vitals reviewed.   ASSESSMENT/PLAN:  1. Umbilical hernia without obstruction and without gangrene Asymptomatic  2. Right inguinal hernia  Painful at times  3. Chronic midline low back pain without sciatica Painful daily. Unable to take an NSAID. Able to take only a limited amount of Tylenol. Mood altering drugs and narcotics are contraindicated. Patient unable to participate with physical therapy.  4. Alcoholic cirrhosis of liver with ascites [facility]) Patient had a recent relapse and drink again. He's only been sober for the last several days. He did this by soliciting Xanax from a friend and substituting it for alcohol in order not to have "the shakes".  5. Tobacco abuse Ongoing   Patient Instructions  NO ALCOHOL Try to reduce the salt in your diet Walk every day that you are able Ask Olegario Messier about a spine doctor in [place] Be careful with changing position    Eustace Moore, MD

## 2017-02-11 NOTE — Telephone Encounter (Signed)
Not getting any.  Referral to back doctor ordered.  Nothing else I consider safe for him

## 2017-02-11 NOTE — Patient Instructions (Addendum)
NO ALCOHOL Try to reduce the salt in your diet Walk every day that you are able Ask Joseph [facility]Kathy [facility] Be careful with changing position

## 2017-02-12 ENCOUNTER — Other Ambulatory Visit (HOSPITAL_COMMUNITY): Payer: Self-pay | Admitting: Emergency Medicine

## 2017-02-12 ENCOUNTER — Ambulatory Visit (HOSPITAL_COMMUNITY)
Admission: RE | Admit: 2017-02-12 | Discharge: 2017-02-12 | Disposition: A | Discharge status: Home / Self Care | Payer: Medicaid - Out of State | Source: Ambulatory Visit | Attending: Emergency Medicine | Admitting: Emergency Medicine

## 2017-02-12 ENCOUNTER — Encounter (HOSPITAL_COMMUNITY): Payer: Self-pay

## 2017-02-12 DIAGNOSIS — R188 Other ascites: Secondary | ICD-10-CM | POA: Insufficient documentation

## 2017-02-12 DIAGNOSIS — K7031 Alcoholic cirrhosis of liver with ascites: Secondary | ICD-10-CM

## 2017-02-12 NOTE — Progress Notes (Addendum)
Pt refuses IV and Albumin  

## 2017-02-12 NOTE — Progress Notes (Signed)
Paracentesis complete no signs of distress. 6.2L yellow colored ascites removed.

## 2017-02-14 ENCOUNTER — Encounter: Payer: Self-pay | Admitting: Family Medicine

## 2017-02-14 ENCOUNTER — Other Ambulatory Visit: Payer: Self-pay | Admitting: Gastroenterology

## 2017-02-15 ENCOUNTER — Other Ambulatory Visit: Payer: Self-pay | Admitting: Family Medicine

## 2017-02-15 DIAGNOSIS — M47896 Other spondylosis, lumbar region: Secondary | ICD-10-CM

## 2017-02-15 DIAGNOSIS — G8929 Other chronic pain: Secondary | ICD-10-CM

## 2017-02-15 DIAGNOSIS — M545 Low back pain: Secondary | ICD-10-CM

## 2017-02-15 NOTE — Telephone Encounter (Signed)
Left message for Jacobey to call office.

## 2017-02-15 NOTE — Telephone Encounter (Signed)
[facility] aware of [facility], and no new pain meds.

## 2017-02-17 ENCOUNTER — Ambulatory Visit (HOSPITAL_COMMUNITY): Payer: Medicaid - Out of State

## 2017-02-17 ENCOUNTER — Other Ambulatory Visit: Payer: Self-pay | Admitting: Gastroenterology

## 2017-02-17 ENCOUNTER — Ambulatory Visit (HOSPITAL_COMMUNITY)
Admission: RE | Admit: 2017-02-17 | Discharge: 2017-02-17 | Disposition: A | Discharge status: Home / Self Care | Payer: Medicaid - Out of State | Source: Ambulatory Visit | Attending: Emergency Medicine | Admitting: Emergency Medicine

## 2017-02-17 ENCOUNTER — Telehealth: Payer: Self-pay | Admitting: Family Medicine

## 2017-02-17 ENCOUNTER — Encounter (HOSPITAL_COMMUNITY): Payer: Self-pay

## 2017-02-17 DIAGNOSIS — R188 Other ascites: Secondary | ICD-10-CM

## 2017-02-17 DIAGNOSIS — K7031 Alcoholic cirrhosis of liver with ascites: Secondary | ICD-10-CM

## 2017-02-17 NOTE — Telephone Encounter (Signed)
[facility] called stated they called to schedule w/ patient, he told them he had no insurance ([place]) and that he wanted to stay within [facility]. However we do not take that insurance so he would be considered self pay at any [place] facility. I tried to explain this to him over the phone last week.

## 2017-02-17 NOTE — Procedures (Signed)
PreOperative Dx: Alcoholic cirrhosis, ascites Postoperative Dx: Alcoholic cirrhosis, ascites Procedure:   US guided paracentesis Radiologist:  Tyron RussellBoles Anesthesia:  10 ml of1% lidocaine Specimen:  5.5 L of yellow ascitic fluid EBL:   < 1 ml Complications: None

## 2017-02-17 NOTE — Progress Notes (Signed)
Paracentesis complete no signs of distress. 5.5 L yellow colored ascites removed.  

## 2017-02-17 NOTE — Telephone Encounter (Signed)
Called and left message for Joseph Cortez to call me back

## 2017-02-19 ENCOUNTER — Other Ambulatory Visit (HOSPITAL_COMMUNITY): Payer: Medicaid - Out of State

## 2017-02-19 ENCOUNTER — Ambulatory Visit: Payer: Medicaid - Out of State | Admitting: Gastroenterology

## 2017-02-24 ENCOUNTER — Ambulatory Visit (HOSPITAL_COMMUNITY)
Admission: RE | Admit: 2017-02-24 | Discharge: 2017-02-24 | Disposition: A | Discharge status: Home / Self Care | Payer: Medicaid - Out of State | Source: Ambulatory Visit | Attending: Gastroenterology | Admitting: Gastroenterology

## 2017-02-24 ENCOUNTER — Other Ambulatory Visit: Payer: Self-pay | Admitting: Gastroenterology

## 2017-02-24 ENCOUNTER — Encounter (HOSPITAL_COMMUNITY): Payer: Self-pay

## 2017-02-24 DIAGNOSIS — R188 Other ascites: Secondary | ICD-10-CM | POA: Insufficient documentation

## 2017-02-24 NOTE — Progress Notes (Signed)
Paracentesis complete no signs of distress. 6.8L yellow colored ascites removed. Pt refused albumin.

## 2017-02-25 ENCOUNTER — Telehealth: Payer: Self-pay

## 2017-02-25 NOTE — Telephone Encounter (Signed)
Pt is wanting to come in to talk with you about a TIPPS and a parking pass. I told him that you don't have anything until July. Please advise

## 2017-02-26 NOTE — Telephone Encounter (Signed)
You can put him in an urgent with me.

## 2017-03-01 NOTE — Telephone Encounter (Signed)
Your first urgent is 03/16/17.Will that be ok

## 2017-03-01 NOTE — Telephone Encounter (Signed)
Please let the patient know that he is scheduled to see AB on 6/15 at 11 am

## 2017-03-01 NOTE — Telephone Encounter (Signed)
LEST MESSAGE FOR PATIENT WITH DATE AND TIME OF APPOINTMENT

## 2017-03-01 NOTE — Telephone Encounter (Signed)
Camille: may I add an 11am to June 15th for him to be seen? He is scheduled for an EGD 6/18 with [facility]. This is to answer some questions he has. However, since he has multiple medical issues, I can update the H&P at same time, which will be more seamless for his appt on June 18th (EGD).

## 2017-03-03 ENCOUNTER — Other Ambulatory Visit: Payer: Self-pay | Admitting: Gastroenterology

## 2017-03-03 ENCOUNTER — Telehealth: Payer: Self-pay | Admitting: Family Medicine

## 2017-03-03 ENCOUNTER — Ambulatory Visit (HOSPITAL_COMMUNITY)
Admission: RE | Admit: 2017-03-03 | Discharge: 2017-03-03 | Disposition: A | Discharge status: Home / Self Care | Payer: Medicaid - Out of State | Source: Ambulatory Visit | Attending: Gastroenterology | Admitting: Gastroenterology

## 2017-03-03 ENCOUNTER — Encounter (HOSPITAL_COMMUNITY): Payer: Self-pay

## 2017-03-03 DIAGNOSIS — R188 Other ascites: Secondary | ICD-10-CM

## 2017-03-03 NOTE — Procedures (Signed)
Ultrasound-guided therapeutic paracentesis performed yielding 6.2 liters of serous colored fluid. No immediate complications. Khristie Sak E 12:49 PM 03/03/2017

## 2017-03-03 NOTE — Progress Notes (Signed)
Paracentesis complete no signs of distress 6.2L yellow colored ascites removed.

## 2017-03-03 NOTE — Telephone Encounter (Signed)
Pt requesting Vistaril refill.  Please call in @ [facility] in [place] and PLEASE PLEASE call pt to let him know when this has been done.

## 2017-03-04 ENCOUNTER — Other Ambulatory Visit: Payer: Self-pay

## 2017-03-04 MED ORDER — HYDROXYZINE PAMOATE 25 MG PO CAPS: 25.0000 mg | ORAL_CAPSULE | Freq: Three times a day (TID) | ORAL | 0 refills | Status: DC | PRN

## 2017-03-04 NOTE — Telephone Encounter (Signed)
Called Har[facility], aware rx sent to [facility] [place].

## 2017-03-05 NOTE — Patient Instructions (Signed)
Joseph Cortez  03/05/2017     @PREFPERIOPPHARMACY @   Your procedure is scheduled on  03/15/2017   Report to [facility] at  800  A.M.  Call this number if you have problems the morning of surgery:  [phone]   Remember:  Do not eat food or drink liquids after midnight.  Take these medicines the morning of surgery with A SIP OF WATER  Neurontin, protonix.   Do not wear jewelry, make-up or nail polish.  Do not wear lotions, powders, or perfumes, or deoderant.  Do not shave 48 hours prior to surgery.  Men may shave face and neck.  Do not bring valuables to the hospital.  [facility] is not responsible for any belongings or valuables.  Contacts, dentures or bridgework may not be worn into surgery.  Leave your suitcase in the car.  After surgery it may be brought to your room.  For patients admitted to the hospital, discharge time will be determined by your treatment team.  Patients discharged the day of surgery will not be allowed to drive home.   Name and phone number of your driver:   family Special instructions:  Follow the diet instructions given to you by Dr Luvenia Starch office.  Please read over the following fact sheets that you were given. Anesthesia Post-op Instructions and Care and Recovery After Surgery       Esophagogastroduodenoscopy Esophagogastroduodenoscopy (EGD) is a procedure to examine the lining of the esophagus, stomach, and first part of the small intestine (duodenum). This procedure is done to check for problems such as inflammation, bleeding, ulcers, or growths. During this procedure, a long, flexible, lighted tube with a camera attached (endoscope) is inserted down the throat. Tell a health care provider about:  Any allergies you have.  All medicines you are taking, including vitamins, herbs, eye drops, creams, and over-the-counter medicines.  Any problems you or family members have had with anesthetic medicines.  Any blood disorders  you have.  Any surgeries you have had.  Any medical conditions you have.  Whether you are pregnant or may be pregnant. What are the risks? Generally, this is a safe procedure. However, problems may occur, including:  Infection.  Bleeding.  A tear (perforation) in the esophagus, stomach, or duodenum.  Trouble breathing.  Excessive sweating.  Spasms of the larynx.  A slowed heartbeat.  Low blood pressure.  What happens before the procedure?  Follow instructions from your health care provider about eating or drinking restrictions.  Ask your health care provider about: ? Changing or stopping your regular medicines. This is especially important if you are taking diabetes medicines or blood thinners. ? Taking medicines such as aspirin and ibuprofen. These medicines can thin your blood. Do not take these medicines before your procedure if your health care provider instructs you not to.  Plan to have someone take you home after the procedure.  If you wear dentures, be ready to remove them before the procedure. What happens during the procedure?  To reduce your risk of infection, your health care team will wash or sanitize their hands.  An IV tube will be put in a vein in your hand or arm. You will get medicines and fluids through this tube.  You will be given one or more of the following: ? A medicine to help you relax (sedative). ? A medicine to numb the area (local anesthetic). This medicine may be sprayed into your throat.  It will make you feel more comfortable and keep you from gagging or coughing during the procedure. ? A medicine for pain.  A mouth guard may be placed in your mouth to protect your teeth and to keep you from biting on the endoscope.  You will be asked to lie on your left side.  The endoscope will be lowered down your throat into your esophagus, stomach, and duodenum.  Air will be put into the endoscope. This will help your health care provider see  better.  The lining of your esophagus, stomach, and duodenum will be examined.  Your health care provider may: ? Take a tissue sample so it can be looked at in a lab (biopsy). ? Remove growths. ? Remove objects (foreign bodies) that are stuck. ? Treat any bleeding with medicines or other devices that stop tissue from bleeding. ? Widen (dilate) or stretch narrowed areas of your esophagus and stomach.  The endoscope will be taken out. The procedure may vary among health care providers and hospitals. What happens after the procedure?  Your blood pressure, heart rate, breathing rate, and blood oxygen level will be monitored often until the medicines you were given have worn off.  Do not eat or drink anything until the numbing medicine has worn off and your gag reflex has returned. This information is not intended to replace advice given to you by your health care provider. Make sure you discuss any questions you have with your health care provider. Document Released: 01/15/2005 Document Revised: 02/20/2016 Document Reviewed: 08/08/2015 Elsevier Interactive Patient Education  2018 [employer] Esophagogastroduodenoscopy, Care After Refer to this sheet in the next few weeks. These instructions provide you with information about caring for yourself after your procedure. Your health care provider may also give you more specific instructions. Your treatment has been planned according to current medical practices, but problems sometimes occur. Call your health care provider if you have any problems or questions after your procedure. What can I expect after the procedure? After the procedure, it is common to have:  A sore throat.  Nausea.  Bloating.  Dizziness.  Fatigue.  Follow these instructions at home:  Do not eat or drink anything until the numbing medicine (local anesthetic) has worn off and your gag reflex has returned. You will know that the local anesthetic has worn off when you  can swallow comfortably.  Do not drive for 24 hours if you received a medicine to help you relax (sedative).  If your health care provider took a tissue sample for testing during the procedure, make sure to get your test results. This is your responsibility. Ask your health care provider or the department performing the test when your results will be ready.  Keep all follow-up visits as told by your health care provider. This is important. Contact a health care provider if:  You cannot stop coughing.  You are not urinating.  You are urinating less than usual. Get help right away if:  You have trouble swallowing.  You cannot eat or drink.  You have throat or chest pain that gets worse.  You are dizzy or light-headed.  You faint.  You have nausea or vomiting.  You have chills.  You have a fever.  You have severe abdominal pain.  You have black, tarry, or bloody stools. This information is not intended to replace advice given to you by your health care provider. Make sure you discuss any questions you have with your health care provider. Document  Released: 08/31/2012 Document Revised: 02/20/2016 Document Reviewed: 08/08/2015 Elsevier Interactive Patient Education  2018 [employer]  Monitored Anesthesia Care Anesthesia is a term that refers to techniques, procedures, and medicines that help a person stay safe and comfortable during a medical procedure. Monitored anesthesia care, or sedation, is one type of anesthesia. Your anesthesia specialist may recommend sedation if you will be having a procedure that does not require you to be unconscious, such as:  Cataract surgery.  A dental procedure.  A biopsy.  A colonoscopy.  During the procedure, you may receive a medicine to help you relax (sedative). There are three levels of sedation:  Mild sedation. At this level, you may feel awake and relaxed. You will be able to follow directions.  Moderate sedation. At this  level, you will be sleepy. You may not remember the procedure.  Deep sedation. At this level, you will be asleep. You will not remember the procedure.  The more medicine you are given, the deeper your level of sedation will be. Depending on how you respond to the procedure, the anesthesia specialist may change your level of sedation or the type of anesthesia to fit your needs. An anesthesia specialist will monitor you closely during the procedure. Let your health care provider know about:  Any allergies you have.  All medicines you are taking, including vitamins, herbs, eye drops, creams, and over-the-counter medicines.  Any use of steroids (by mouth or as a cream).  Any problems you or family members have had with sedatives and anesthetic medicines.  Any blood disorders you have.  Any surgeries you have had.  Any medical conditions you have, such as sleep apnea.  Whether you are pregnant or may be pregnant.  Any use of cigarettes, alcohol, or street drugs. What are the risks? Generally, this is a safe procedure. However, problems may occur, including:  Getting too much medicine (oversedation).  Nausea.  Allergic reaction to medicines.  Trouble breathing. If this happens, a breathing tube may be used to help with breathing. It will be removed when you are awake and breathing on your own.  Heart trouble.  Lung trouble.  Before the procedure Staying hydrated Follow instructions from your health care provider about hydration, which may include:  Up to 2 hours before the procedure - you may continue to drink clear liquids, such as water, clear fruit juice, black coffee, and plain tea.  Eating and drinking restrictions Follow instructions from your health care provider about eating and drinking, which may include:  8 hours before the procedure - stop eating heavy meals or foods such as meat, fried foods, or fatty foods.  6 hours before the procedure - stop eating light  meals or foods, such as toast or cereal.  6 hours before the procedure - stop drinking milk or drinks that contain milk.  2 hours before the procedure - stop drinking clear liquids.  Medicines Ask your health care provider about:  Changing or stopping your regular medicines. This is especially important if you are taking diabetes medicines or blood thinners.  Taking medicines such as aspirin and ibuprofen. These medicines can thin your blood. Do not take these medicines before your procedure if your health care provider instructs you not to.  Tests and exams  You will have a physical exam.  You may have blood tests done to show: ? How well your kidneys and liver are working. ? How well your blood can clot.  General instructions  Plan to have someone  take you home from the hospital or clinic.  If you will be going home right after the procedure, plan to have someone with you for 24 hours.  What happens during the procedure?  Your blood pressure, heart rate, breathing, level of pain and overall condition will be monitored.  An IV tube will be inserted into one of your veins.  Your anesthesia specialist will give you medicines as needed to keep you comfortable during the procedure. This may mean changing the level of sedation.  The procedure will be performed. After the procedure  Your blood pressure, heart rate, breathing rate, and blood oxygen level will be monitored until the medicines you were given have worn off.  Do not drive for 24 hours if you received a sedative.  You may: ? Feel sleepy, clumsy, or nauseous. ? Feel forgetful about what happened after the procedure. ? Have a sore throat if you had a breathing tube during the procedure. ? Vomit. This information is not intended to replace advice given to you by your health care provider. Make sure you discuss any questions you have with your health care provider. Document Released: 06/10/2005 Document Revised:  02/21/2016 Document Reviewed: 01/05/2016 Elsevier Interactive Patient Education  2018 [employer]. Monitored Anesthesia Care, Care After These instructions provide you with information about caring for yourself after your procedure. Your health care provider may also give you more specific instructions. Your treatment has been planned according to current medical practices, but problems sometimes occur. Call your health care provider if you have any problems or questions after your procedure. What can I expect after the procedure? After your procedure, it is common to:  Feel sleepy for several hours.  Feel clumsy and have poor balance for several hours.  Feel forgetful about what happened after the procedure.  Have poor judgment for several hours.  Feel nauseous or vomit.  Have a sore throat if you had a breathing tube during the procedure.  Follow these instructions at home: For at least 24 hours after the procedure:   Do not: ? Participate in activities in which you could fall or become injured. ? Drive. ? Use heavy machinery. ? Drink alcohol. ? Take sleeping pills or medicines that cause drowsiness. ? Make important decisions or sign legal documents. ? Take care of children on your own.  Rest. Eating and drinking  Follow the diet that is recommended by your health care provider.  If you vomit, drink water, juice, or soup when you can drink without vomiting.  Make sure you have little or no nausea before eating solid foods. General instructions  Have a responsible adult stay with you until you are awake and alert.  Take over-the-counter and prescription medicines only as told by your health care provider.  If you smoke, do not smoke without supervision.  Keep all follow-up visits as told by your health care provider. This is important. Contact a health care provider if:  You keep feeling nauseous or you keep vomiting.  You feel light-headed.  You develop a  rash.  You have a fever. Get help right away if:  You have trouble breathing. This information is not intended to replace advice given to you by your health care provider. Make sure you discuss any questions you have with your health care provider. Document Released: 01/05/2016 Document Revised: 05/06/2016 Document Reviewed: 01/05/2016 Elsevier Interactive Patient Education  [employer]2018 [employer] Inc[employer]

## 2017-03-09 NOTE — Patient Instructions (Signed)
20    Your procedure is scheduled on: 03/15/2017  Report to [facility] at 8:00     AM.  Call this number if you have problems the morning of surgery: [phone]   Remember:   Do not drink or eat food:After Midnight.      Take these medicines the morning of surgery with A SIP OF WATER: Protonix and gabapentin   Do not wear jewelry, make-up or nail polish.  Do not wear lotions, powders, or perfumes. You may wear deodorant.  Do not shave 48 hours prior to surgery. Men may shave face and neck.  Do not bring valuables to the hospital.  Contacts, dentures or bridgework may not be worn into surgery.  Leave suitcase in the car. After surgery it may be brought to your room.  For patients admitted to the hospital, checkout time is 11:00 AM the day of discharge.   Patients discharged the day of surgery will not be allowed to drive home.  Name and phone number of your driver:      Endoscopy Care After Please read the instructions outlined below and refer to this sheet in the next few weeks. These discharge instructions provide you with general information on caring for yourself after you leave the hospital. Your doctor may also give you specific instructions. While your treatment has been planned according to the most current medical practices available, unavoidable complications occasionally occur. If you have any problems or questions after discharge, please call your doctor. HOME CARE INSTRUCTIONS Activity  You may resume your regular activity but move at a slower pace for the next 24 hours.   Take frequent rest periods for the next 24 hours.   Walking will help expel (get rid of) the air and reduce the bloated feeling in your abdomen.   No driving for 24 hours (because of the anesthesia (medicine) used during the test).   You may shower.   Do not sign any important legal documents or operate any machinery for 24 hours (because of the anesthesia used during the test).   Nutrition  Drink plenty of fluids.   You may resume your normal diet.   Begin with a light meal and progress to your normal diet.   Avoid alcoholic beverages for 24 hours or as instructed by your caregiver.  Medications You may resume your normal medications unless your caregiver tells you otherwise. What you can expect today  You may experience abdominal discomfort such as a feeling of fullness or "gas" pains.   You may experience a sore throat for 2 to 3 days. This is normal. Gargling with salt water may help this.  Follow-up Your doctor will discuss the results of your test with you. SEEK IMMEDIATE MEDICAL CARE IF:  You have excessive nausea (feeling sick to your stomach) and/or vomiting.   You have severe abdominal pain and distention (swelling).   You have trouble swallowing.   You have a temperature over 100 F (37.8 C).   You have rectal bleeding or vomiting of blood.  Document Released: 04/28/2004 Document Revised: 09/03/2011 Document Reviewed: 11/09/2007

## 2017-03-10 ENCOUNTER — Encounter (HOSPITAL_COMMUNITY): Payer: Self-pay

## 2017-03-10 ENCOUNTER — Encounter (HOSPITAL_COMMUNITY)
Admission: RE | Admit: 2017-03-10 | Discharge: 2017-03-10 | Disposition: A | Discharge status: Home / Self Care | Payer: Medicaid - Out of State | Source: Ambulatory Visit | Attending: Internal Medicine | Admitting: Internal Medicine

## 2017-03-10 ENCOUNTER — Ambulatory Visit (HOSPITAL_COMMUNITY)
Admission: RE | Admit: 2017-03-10 | Discharge: 2017-03-10 | Disposition: A | Discharge status: Home / Self Care | Payer: Medicaid - Out of State | Source: Ambulatory Visit | Attending: Gastroenterology | Admitting: Gastroenterology

## 2017-03-10 ENCOUNTER — Other Ambulatory Visit: Payer: Self-pay | Admitting: Gastroenterology

## 2017-03-10 DIAGNOSIS — R188 Other ascites: Secondary | ICD-10-CM | POA: Diagnosis not present

## 2017-03-10 LAB — CBC
HEMATOCRIT: 43.2 % (ref 39.0–52.0)
HEMOGLOBIN: 14.8 g/dL (ref 13.0–17.0)
MCH: 31.2 pg (ref 26.0–34.0)
MCHC: 34.3 g/dL (ref 30.0–36.0)
MCV: 91.1 fL (ref 78.0–100.0)
Platelets: 140 10*3/uL — ABNORMAL LOW (ref 150–400)
RBC: 4.74 MIL/uL (ref 4.22–5.81)
RDW: 14.1 % (ref 11.5–15.5)
WBC: 8.2 10*3/uL (ref 4.0–10.5)

## 2017-03-10 LAB — BASIC METABOLIC PANEL
Anion gap: 10 (ref 5–15)
BUN: 9 mg/dL (ref 6–20)
CHLORIDE: 101 mmol/L (ref 101–111)
CO2: 25 mmol/L (ref 22–32)
CREATININE: 0.74 mg/dL (ref 0.61–1.24)
Calcium: 9 mg/dL (ref 8.9–10.3)
GFR calc non Af Amer: >60 mL/min (ref >60)
Glucose, Bld: 87 mg/dL (ref 65–99)
POTASSIUM: 3.8 mmol/L (ref 3.5–5.1)
SODIUM: 136 mmol/L (ref 135–145)

## 2017-03-10 NOTE — Progress Notes (Signed)
Pt refuses Albumin at present.

## 2017-03-10 NOTE — Procedures (Signed)
  US guided RLQ paracentesis  7.5 L yellow fluid No labs per MD  Pt tolerated well

## 2017-03-10 NOTE — Progress Notes (Signed)
Paracentesis complete no signs of distress, 7.5L yellow colored ascites removed.

## 2017-03-11 ENCOUNTER — Inpatient Hospital Stay (HOSPITAL_COMMUNITY)
Admission: RE | Admit: 2017-03-11 | Discharge: 2017-03-11 | Disposition: A | Discharge status: Home / Self Care | Payer: Medicaid - Out of State | Source: Ambulatory Visit

## 2017-03-12 ENCOUNTER — Ambulatory Visit (INDEPENDENT_AMBULATORY_CARE_PROVIDER_SITE_OTHER): Payer: Medicaid - Out of State | Admitting: Gastroenterology

## 2017-03-12 ENCOUNTER — Encounter: Payer: Self-pay | Admitting: Gastroenterology

## 2017-03-12 VITALS — BP 107/70 | HR 78 | Ht 68.0 in | Wt 199.6 lb

## 2017-03-12 DIAGNOSIS — K7031 Alcoholic cirrhosis of liver with ascites: Secondary | ICD-10-CM | POA: Diagnosis not present

## 2017-03-12 NOTE — Progress Notes (Signed)
Referring Provider: Eustace MooreNelson, Yvonne Sue, MD Primary Care Physician:  Eustace MooreNelson, Yvonne Sue, MD Primary GI: Dr. Jena Gaussourk   Chief Complaint  Patient presents with  . Cirrhosis    HPI:   Joseph Cortez is a 51 y.o. male presenting today with a history of ETOH cirrhosis, variceal bleeding in early 2017 at outside hospital, chronic need for LVAPs. Last EGD was in May 2017 with Grade 2 varices s/p banding with complete deflation of varices, surveillance in May 2018.  He has not been on a non-selective beta-blocker due to intolerance with hypotension.     He had briefly stopped drinking for 4 months, noted at  visit December 28, 2016. Unfortunately, he had started drinking again in May 2018 sporadically. He returns today with his sister, stating he has not had anything to drink since May 9th when last seen.  Xifaxan has finally been approved through patient assistance, and he is on lactulose. He also had evidence of possible colitis on CT about a month ago, which is non-specific in setting of ascites. He has been evaluated for umbilical hernia and inguinal hernia repair by Dr. Lovell SheehanJenkins, but he is not a candidate for surgery due to risk of adverse outcomes in light of his advanced liver disease and would need to have done at a tertiary care facility.   Due for US abdomen in Aug 2018. Lasix 40 mg and aldactone 100 mg daily prescribed, but he has only been taking 50 mg of Aldactone. Protonix 40 mg BID. Lactulose 30 BID to TID without much results. Abdominal discomfort improved. [place] through [place], which is encouraging as now he can pursue a consultation for liver transplant candidacy in the future. He is attempting to follow a low sodium diet. He feels overall more encouraged compared to when I have last seen him. No overt GI bleeding. He has noted intermittent mild confusion. He states he was not taking Xifaxan, as he was concerned it was constipating him.   Past Medical History:  Diagnosis Date    . AKI (acute kidney injury) (HCC) 07/14/2016  . Anemia   . Anxiety   . Arthritis   . Blood transfusion without reported diagnosis   . Cirrhosis (HCC)   . COPD (chronic obstructive pulmonary disease) (HCC)   . Depression   . Hypokalemia 07/14/2016  . Hyponatremia 07/14/2016  . Neuromuscular disorder (HCC)    Neuropathy  . Neuropathy   . Substance abuse   . Ulcer    BLEEDING VARICES, ULCERATIONS    Past Surgical History:  Procedure Laterality Date  . BIOPSY  06/27/2015   Procedure: BIOPSY (Gastric);  Surgeon: Corbin Adeobert M Rourk, MD;  Location: [facility];  Service: Endoscopy;;  . CHOLECYSTECTOMY    . ESOPHAGEAL BANDING N/A 01/30/2016   Procedure: ESOPHAGEAL BANDING;  Surgeon: Corbin Adeobert M Rourk, MD;  Location: [facility];  Service: Endoscopy;  Laterality: N/A;  . ESOPHAGOGASTRODUODENOSCOPY  January 06, 2016   [facility]: duodenitis in bullb, portal gastropathy, 2 columns of large distal esophageal varices, one with flat red spot, s/p band ligation. Needs 4 week surveillance EGD   . ESOPHAGOGASTRODUODENOSCOPY (EGD) WITH PROPOFOL N/A 06/27/2015   EXB:MWUXRMR:mild erosive reflux esophagitis, portal gastropathy, duodenal bulbar ulcer, negative H.pylori. Surveillance due yearly according to ASGE current guidelines regarding ETOH cirrhosis and ongoing ETOH abuse  . ESOPHAGOGASTRODUODENOSCOPY (EGD) WITH PROPOFOL N/A 01/30/2016   Dr. Jena Gaussourk: Grade 2 varices s/p banding with complete deflation of varices, portal gastropathy, surveillance in May  2018    Current Outpatient Prescriptions  Medication Sig Dispense Refill  . bismuth subsalicylate (PEPTO BISMOL) 262 MG/15ML suspension Take 30 mLs by mouth every 6 (six) hours as needed (for diarrhea/loose stools or upset stomach.).     Marland Kitchen furosemide (LASIX) 20 MG tablet Take 2 tablets (40 mg total) by mouth daily. 60 tablet 3  . gabapentin (NEURONTIN) 300 MG capsule Take 300-600 mg by mouth See admin instructions. 300mg  in the morning, 300mg  at 4 pm, and 600mg  at  bedtime    . hydrOXYzine (VISTARIL) 25 MG capsule Take 1 capsule (25 mg total) by mouth 3 (three) times daily as needed. (Patient taking differently: Take 25 mg by mouth 3 (three) times daily as needed (for nervousness/anxiety (typically once daily in the morning)). ) 30 capsule 0  . lactulose (CHRONULAC) 10 GM/15ML solution TAKE BY MOUTH TWICE DAILY (Patient taking differently: TAKE BY MOUTH UP TO THREE TIMES DAILY  FOR BOWEL ISSUES (TYPICALLY TWICE DAILY)) 473 mL 19  . Multiple Vitamin (MULTIVITAMIN WITH MINERALS) TABS tablet Take 1 tablet by mouth daily.    . pantoprazole (PROTONIX) 40 MG tablet Take 1 tablet (40 mg total) by mouth 2 (two) times daily before a meal. (Patient taking differently: Take 40 mg by mouth See admin instructions. TAKE 1 TABLET (40 MG) BY MOUTH ONCE DAILY (SCHEDULED); THEN TAKE AN ADDITIONAL DOSE IF NEEDED) 180 tablet 3  . spironolactone (ALDACTONE) 50 MG tablet Take 2 tablets (100 mg total) by mouth daily. (Patient taking differently: Take 50 mg by mouth daily. ) 60 tablet 3  . rifaximin (XIFAXAN) 550 MG TABS tablet Take 1 tablet (550 mg total) by mouth 2 (two) times daily. (Patient not taking: Reported on 02/11/2017) 180 tablet 3   No current facility-administered medications for this visit.     Allergies as of 03/12/2017 - Review Complete 03/12/2017  Allergen Reaction Noted  . Sertraline Other (See Comments) 01/07/2016    Family History  Problem Relation Age of Onset  . Colon cancer Maternal Uncle   . Alcohol abuse Maternal Uncle   . Arthritis Mother   . Cancer Mother        breast cancer  . Hypertension Father   . Hyperlipidemia Father   . Lupus Sister   . Alcohol abuse Paternal Uncle   . Alzheimer's disease Maternal Grandmother   . COPD Maternal Grandfather   . Diabetes Maternal Grandfather   . Heart disease Paternal Grandfather   . Alcohol abuse Cousin     Social History   Social History  . Marital status: Divorced    Spouse name: N/A    . Number of children: 2  . Years of education: 11   Occupational History  . disability     former truck driver   Social History Main Topics  . Smoking status: Current Every Day Smoker    Types: Cigarettes  . Smokeless tobacco: Never Used     Comment: 3-4 cigarettes daily   . Alcohol use 16.8 oz/week    28 Glasses of wine per week     Comment: alcohol yesterday  . Drug use: No     Comment: cocaine historically, several years ago  . Sexual activity: Yes    Birth control/ protection: None   Other Topics Concern  . None   Social History Narrative   Lives alone   Lives near sister   No reg exercise   Disabled for 3 years    Review of Systems: As  mentioned in HPI.   Physical Exam: BP 107/70   Pulse 78   Ht 5\' 8"  (1.727 m)   Wt 199 lb 9.6 oz (90.5 kg)   BMI 30.35 kg/m  General:   Alert and oriented. Appears in good spirits compared to prior visits and more hopeful. Chronically-ill appearing.  Head:  Normocephalic and atraumatic. Mouth:  Oral mucosa pink and moist.  Heart:  S1, S2 present without murmurs Abdomen:  +BS, soft but distended with moderately tense ascites, umbilical hernia  Msk:  Symmetrical without gross deformities. Normal posture. Extremities:  Without edema. Neurologic:  Alert and  oriented x4;  Negative asterixis  Psych:  Alert and cooperative. Normal mood and affect.

## 2017-03-12 NOTE — Assessment & Plan Note (Addendum)
51 year old pleasant male with ETOH cirrhosis, need for almost weekly paracenteses, abstaining from ETOH for about 6 weeks now. Due for variceal surveillance and has not been able to tolerate non-selective beta-blocker therapy due to hypotension. He has been able to obtain Medicaid of [place], which is encouraging, as he has been limited with any evaluation for transplant candidacy historically. However, we have discussed on multiple occassions the need for complete abstinence from ETOH. With weekly paracenteses despite diuretic therapy, TIPS consideration had been raised in prior visits. He has a history of encephalopathy, and I discussed with him the complications after a TIPS procedure. Unclear if he would be an appropriate candidate. Will continue to closely follow in clinic.   Proceed with upper endoscopy in the near future with Dr. Jena Gaussourk. The risks, benefits, and alternatives have been discussed in detail with patient. They have stated understanding and desire to proceed.  PROPOFOL due to history of ETOH abuse, polypharmacy Continue ETOH cessation as he is doing Will start referral process for liver transplantation consultation and discussed absolute continued avoidance of alcohol Lactulose increased to 45 ml BID to TID, resume Xifaxan Lasix 40 mg daily and Aldactone increased to 100 mg as he was only taking 50 mg prior. Renal function remains good, so diuretic therapy could be adjusted although historically this has not resulted in much difference with ascites accumulation Follow-up after procedure

## 2017-03-12 NOTE — Patient Instructions (Addendum)
Great job on stopping alcohol!!!   Fluid pills: 1. Take 40 milligrams of lasix each morning and 100 milligrams of spironolactone each morning. Watch your blood pressure and let me know how you do.  Increase the lactulose to 45 milliliters twice to three times a day. The goal is 2-3 soft bowel movements a day. Resume the xifaxan twice a day.   I will see you Monday to sign the forms  Return to office in 6 weeks!  Have a great summer!

## 2017-03-15 ENCOUNTER — Ambulatory Visit (HOSPITAL_COMMUNITY)
Admission: RE | Admit: 2017-03-15 | Discharge: 2017-03-15 | Disposition: A | Discharge status: Home / Self Care | Payer: Medicaid - Out of State | Source: Ambulatory Visit | Attending: Internal Medicine | Admitting: Internal Medicine

## 2017-03-15 ENCOUNTER — Ambulatory Visit (HOSPITAL_COMMUNITY): Payer: Medicaid - Out of State | Admitting: Anesthesiology

## 2017-03-15 ENCOUNTER — Encounter (HOSPITAL_COMMUNITY): Payer: Self-pay | Admitting: *Deleted

## 2017-03-15 ENCOUNTER — Encounter: Payer: Self-pay | Admitting: Internal Medicine

## 2017-03-15 ENCOUNTER — Encounter (HOSPITAL_COMMUNITY)
Admission: RE | Disposition: A | Discharge status: Home / Self Care | Payer: Self-pay | Source: Ambulatory Visit | Attending: Internal Medicine

## 2017-03-15 DIAGNOSIS — F419 Anxiety disorder, unspecified: Secondary | ICD-10-CM | POA: Insufficient documentation

## 2017-03-15 DIAGNOSIS — I8510 Secondary esophageal varices without bleeding: Secondary | ICD-10-CM | POA: Diagnosis not present

## 2017-03-15 DIAGNOSIS — Z888 Allergy status to other drugs, medicaments and biological substances status: Secondary | ICD-10-CM | POA: Diagnosis not present

## 2017-03-15 DIAGNOSIS — K7031 Alcoholic cirrhosis of liver with ascites: Secondary | ICD-10-CM | POA: Insufficient documentation

## 2017-03-15 DIAGNOSIS — J449 Chronic obstructive pulmonary disease, unspecified: Secondary | ICD-10-CM | POA: Diagnosis not present

## 2017-03-15 DIAGNOSIS — G629 Polyneuropathy, unspecified: Secondary | ICD-10-CM | POA: Insufficient documentation

## 2017-03-15 DIAGNOSIS — K3189 Other diseases of stomach and duodenum: Secondary | ICD-10-CM | POA: Diagnosis not present

## 2017-03-15 DIAGNOSIS — K766 Portal hypertension: Secondary | ICD-10-CM | POA: Insufficient documentation

## 2017-03-15 DIAGNOSIS — K7011 Alcoholic hepatitis with ascites: Secondary | ICD-10-CM | POA: Insufficient documentation

## 2017-03-15 DIAGNOSIS — F17210 Nicotine dependence, cigarettes, uncomplicated: Secondary | ICD-10-CM | POA: Diagnosis not present

## 2017-03-15 DIAGNOSIS — I8500 Esophageal varices without bleeding: Secondary | ICD-10-CM

## 2017-03-15 DIAGNOSIS — Z79899 Other long term (current) drug therapy: Secondary | ICD-10-CM | POA: Diagnosis not present

## 2017-03-15 DIAGNOSIS — K7030 Alcoholic cirrhosis of liver without ascites: Secondary | ICD-10-CM | POA: Diagnosis present

## 2017-03-15 HISTORY — PX: ESOPHAGOGASTRODUODENOSCOPY (EGD) WITH PROPOFOL: SHX5813

## 2017-03-15 LAB — GLUCOSE, CAPILLARY: Glucose-Capillary: 104 mg/dL — ABNORMAL HIGH (ref 65–99)

## 2017-03-15 SURGERY — ESOPHAGOGASTRODUODENOSCOPY (EGD) WITH PROPOFOL
Anesthesia: Monitor Anesthesia Care

## 2017-03-15 MED ORDER — SUCRALFATE 1 GM/10ML PO SUSP
1.0000 g | Freq: Three times a day (TID) | ORAL | Status: DC
  Filled 2017-03-15: qty 10

## 2017-03-15 MED ORDER — CHLORHEXIDINE GLUCONATE CLOTH 2 % EX PADS: 6.0000 | MEDICATED_PAD | Freq: Once | CUTANEOUS | Status: DC

## 2017-03-15 MED ORDER — FENTANYL CITRATE (PF) 100 MCG/2ML IJ SOLN
INTRAMUSCULAR | Status: AC
  Filled 2017-03-15: qty 2

## 2017-03-15 MED ORDER — LIDOCAINE VISCOUS 2 % MT SOLN
OROMUCOSAL | Status: DC | PRN
  Administered 2017-03-15: 1 via OROMUCOSAL

## 2017-03-15 MED ORDER — MIDAZOLAM HCL 2 MG/2ML IJ SOLN
INTRAMUSCULAR | Status: AC
  Filled 2017-03-15: qty 2

## 2017-03-15 MED ORDER — FENTANYL CITRATE (PF) 100 MCG/2ML IJ SOLN
25.0000 ug | Freq: Once | INTRAMUSCULAR | Status: AC
  Administered 2017-03-15: 25 ug via INTRAVENOUS

## 2017-03-15 MED ORDER — SUCRALFATE 1 GM/10ML PO SUSP
1.0000 g | Freq: Once | ORAL | Status: AC
  Administered 2017-03-15: 1 g via ORAL
  Filled 2017-03-15: qty 10

## 2017-03-15 MED ORDER — LIDOCAINE HCL (CARDIAC) 10 MG/ML IV SOLN
INTRAVENOUS | Status: DC | PRN
  Administered 2017-03-15: 40 mg via INTRAVENOUS

## 2017-03-15 MED ORDER — PROPOFOL 10 MG/ML IV BOLUS
INTRAVENOUS | Status: AC
  Filled 2017-03-15: qty 40

## 2017-03-15 MED ORDER — MIDAZOLAM HCL 2 MG/2ML IJ SOLN
1.0000 mg | INTRAMUSCULAR | Status: AC
  Administered 2017-03-15: 2 mg via INTRAVENOUS

## 2017-03-15 MED ORDER — LACTATED RINGERS IV SOLN
INTRAVENOUS | Status: DC
  Administered 2017-03-15: 09:00:00 via INTRAVENOUS

## 2017-03-15 MED ORDER — GLYCOPYRROLATE 0.2 MG/ML IJ SOLN: 0.2000 mg | Freq: Once | INTRAMUSCULAR | Status: DC | PRN

## 2017-03-15 MED ORDER — LIDOCAINE VISCOUS 2 % MT SOLN
OROMUCOSAL | Status: AC
  Filled 2017-03-15: qty 15

## 2017-03-15 MED ORDER — LIDOCAINE HCL (PF) 1 % IJ SOLN
INTRAMUSCULAR | Status: AC
  Filled 2017-03-15: qty 5

## 2017-03-15 MED ORDER — PROPOFOL 500 MG/50ML IV EMUL
INTRAVENOUS | Status: DC | PRN
  Administered 2017-03-15: 150 ug/kg/min via INTRAVENOUS
  Administered 2017-03-15: 09:00:00 via INTRAVENOUS

## 2017-03-15 NOTE — H&P (View-Only) (Signed)
Referring Provider: Eustace MooreNelson, Yvonne Sue, MD Primary Care Physician:  Eustace MooreNelson, Yvonne Sue, MD Primary GI: Dr. Jena Gaussourk   Chief Complaint  Patient presents with  . Cirrhosis    HPI:   Joseph Cortez is a 51 y.o. male presenting today with a history of ETOH cirrhosis, variceal bleeding in early 2017 at outside hospital, chronic need for LVAPs. Last EGD was in May 2017 with Grade 2 varices s/p banding with complete deflation of varices, surveillance in May 2018.  He has not been on a non-selective beta-blocker due to intolerance with hypotension.     He had briefly stopped drinking for 4 months, noted at  visit December 28, 2016. Unfortunately, he had started drinking again in May 2018 sporadically. He returns today with his sister, stating he has not had anything to drink since May 9th when last seen.  Xifaxan has finally been approved through patient assistance, and he is on lactulose. He also had evidence of possible colitis on CT about a month ago, which is non-specific in setting of ascites. He has been evaluated for umbilical hernia and inguinal hernia repair by Dr. Lovell SheehanJenkins, but he is not a candidate for surgery due to risk of adverse outcomes in light of his advanced liver disease and would need to have done at a tertiary care facility.   Due for US abdomen in Aug 2018. Lasix 40 mg and aldactone 100 mg daily prescribed, but he has only been taking 50 mg of Aldactone. Protonix 40 mg BID. Lactulose 30 BID to TID without much results. Abdominal discomfort improved. [place] through [place], which is encouraging as now he can pursue a consultation for liver transplant candidacy in the future. He is attempting to follow a low sodium diet. He feels overall more encouraged compared to when I have last seen him. No overt GI bleeding. He has noted intermittent mild confusion. He states he was not taking Xifaxan, as he was concerned it was constipating him.   Past Medical History:  Diagnosis Date    . AKI (acute kidney injury) (HCC) 07/14/2016  . Anemia   . Anxiety   . Arthritis   . Blood transfusion without reported diagnosis   . Cirrhosis (HCC)   . COPD (chronic obstructive pulmonary disease) (HCC)   . Depression   . Hypokalemia 07/14/2016  . Hyponatremia 07/14/2016  . Neuromuscular disorder (HCC)    Neuropathy  . Neuropathy   . Substance abuse   . Ulcer    BLEEDING VARICES, ULCERATIONS    Past Surgical History:  Procedure Laterality Date  . BIOPSY  06/27/2015   Procedure: BIOPSY (Gastric);  Surgeon: Corbin Adeobert M Rourk, MD;  Location: [facility];  Service: Endoscopy;;  . CHOLECYSTECTOMY    . ESOPHAGEAL BANDING N/A 01/30/2016   Procedure: ESOPHAGEAL BANDING;  Surgeon: Corbin Adeobert M Rourk, MD;  Location: [facility];  Service: Endoscopy;  Laterality: N/A;  . ESOPHAGOGASTRODUODENOSCOPY  January 06, 2016   [facility]: duodenitis in bullb, portal gastropathy, 2 columns of large distal esophageal varices, one with flat red spot, s/p band ligation. Needs 4 week surveillance EGD   . ESOPHAGOGASTRODUODENOSCOPY (EGD) WITH PROPOFOL N/A 06/27/2015   EXB:MWUXRMR:mild erosive reflux esophagitis, portal gastropathy, duodenal bulbar ulcer, negative H.pylori. Surveillance due yearly according to ASGE current guidelines regarding ETOH cirrhosis and ongoing ETOH abuse  . ESOPHAGOGASTRODUODENOSCOPY (EGD) WITH PROPOFOL N/A 01/30/2016   Dr. Jena Gaussourk: Grade 2 varices s/p banding with complete deflation of varices, portal gastropathy, surveillance in May  2018    Current Outpatient Prescriptions  Medication Sig Dispense Refill  . bismuth subsalicylate (PEPTO BISMOL) 262 MG/15ML suspension Take 30 mLs by mouth every 6 (six) hours as needed (for diarrhea/loose stools or upset stomach.).     Marland Kitchen furosemide (LASIX) 20 MG tablet Take 2 tablets (40 mg total) by mouth daily. 60 tablet 3  . gabapentin (NEURONTIN) 300 MG capsule Take 300-600 mg by mouth See admin instructions. 300mg  in the morning, 300mg  at 4 pm, and 600mg  at  bedtime    . hydrOXYzine (VISTARIL) 25 MG capsule Take 1 capsule (25 mg total) by mouth 3 (three) times daily as needed. (Patient taking differently: Take 25 mg by mouth 3 (three) times daily as needed (for nervousness/anxiety (typically once daily in the morning)). ) 30 capsule 0  . lactulose (CHRONULAC) 10 GM/15ML solution TAKE BY MOUTH TWICE DAILY (Patient taking differently: TAKE BY MOUTH UP TO THREE TIMES DAILY  FOR BOWEL ISSUES (TYPICALLY TWICE DAILY)) 473 mL 19  . Multiple Vitamin (MULTIVITAMIN WITH MINERALS) TABS tablet Take 1 tablet by mouth daily.    . pantoprazole (PROTONIX) 40 MG tablet Take 1 tablet (40 mg total) by mouth 2 (two) times daily before a meal. (Patient taking differently: Take 40 mg by mouth See admin instructions. TAKE 1 TABLET (40 MG) BY MOUTH ONCE DAILY (SCHEDULED); THEN TAKE AN ADDITIONAL DOSE IF NEEDED) 180 tablet 3  . spironolactone (ALDACTONE) 50 MG tablet Take 2 tablets (100 mg total) by mouth daily. (Patient taking differently: Take 50 mg by mouth daily. ) 60 tablet 3  . rifaximin (XIFAXAN) 550 MG TABS tablet Take 1 tablet (550 mg total) by mouth 2 (two) times daily. (Patient not taking: Reported on 02/11/2017) 180 tablet 3   No current facility-administered medications for this visit.     Allergies as of 03/12/2017 - Review Complete 03/12/2017  Allergen Reaction Noted  . Sertraline Other (See Comments) 01/07/2016    Family History  Problem Relation Age of Onset  . Colon cancer Maternal Uncle   . Alcohol abuse Maternal Uncle   . Arthritis Mother   . Cancer Mother        breast cancer  . Hypertension Father   . Hyperlipidemia Father   . Lupus Sister   . Alcohol abuse Paternal Uncle   . Alzheimer's disease Maternal Grandmother   . COPD Maternal Grandfather   . Diabetes Maternal Grandfather   . Heart disease Paternal Grandfather   . Alcohol abuse Cousin     Social History   Social History  . Marital status: Divorced    Spouse name: N/A    . Number of children: 2  . Years of education: 11   Occupational History  . disability     former truck driver   Social History Main Topics  . Smoking status: Current Every Day Smoker    Types: Cigarettes  . Smokeless tobacco: Never Used     Comment: 3-4 cigarettes daily   . Alcohol use 16.8 oz/week    28 Glasses of wine per week     Comment: alcohol yesterday  . Drug use: No     Comment: cocaine historically, several years ago  . Sexual activity: Yes    Birth control/ protection: None   Other Topics Concern  . None   Social History Narrative   Lives alone   Lives near sister   No reg exercise   Disabled for 3 years    Review of Systems: As  mentioned in HPI.   Physical Exam: BP 107/70   Pulse 78   Ht 5\' 8"  (1.727 m)   Wt 199 lb 9.6 oz (90.5 kg)   BMI 30.35 kg/m  General:   Alert and oriented. Appears in good spirits compared to prior visits and more hopeful. Chronically-ill appearing.  Head:  Normocephalic and atraumatic. Mouth:  Oral mucosa pink and moist.  Heart:  S1, S2 present without murmurs Abdomen:  +BS, soft but distended with moderately tense ascites, umbilical hernia  Msk:  Symmetrical without gross deformities. Normal posture. Extremities:  Without edema. Neurologic:  Alert and  oriented x4;  Negative asterixis  Psych:  Alert and cooperative. Normal mood and affect.

## 2017-03-15 NOTE — Progress Notes (Signed)
cc'ed to pcp °

## 2017-03-15 NOTE — Anesthesia Postprocedure Evaluation (Signed)
Anesthesia Post Note  Patient: Gilmer Kaminsky  Procedure(s) Performed: Procedure(s) (LRB): ESOPHAGOGASTRODUODENOSCOPY (EGD) WITH PROPOFOL (N/A)  Patient location during evaluation: PACU Anesthesia Type: MAC Level of consciousness: awake and alert and oriented Pain management: pain level controlled Vital Signs Assessment: post-procedure vital signs reviewed and stable Respiratory status: spontaneous breathing and respiratory function stable Cardiovascular status: stable Postop Assessment: no signs of nausea or vomiting Anesthetic complications: no     Last Vitals:  Vitals:   03/15/17 0859 03/15/17 0900  BP:  96/67  Pulse:    Resp: 12 (!) 22  Temp:      Last Pain:  Vitals:   03/15/17 0831  TempSrc: Oral  PainSc: 3                  Zamia Tyminski A

## 2017-03-15 NOTE — Anesthesia Preprocedure Evaluation (Signed)
Anesthesia Evaluation  Patient identified by MRN, date of birth, ID band Patient awake    Reviewed: Allergy & Precautions, NPO status , Patient's Chart, lab work & pertinent test results  Airway Mallampati: II  TM Distance: >3 FB     Dental  (+) Teeth Intact, Dental Advisory Given   Pulmonary Current Smoker,    breath sounds clear to auscultation       Cardiovascular negative cardio ROS   Rhythm:Regular Rate:Normal     Neuro/Psych PSYCHIATRIC DISORDERS Anxiety Depression  Neuromuscular disease    GI/Hepatic PUD, (+) Cirrhosis   Esophageal Varices and ascites  substance abuse  alcohol use, Hepatitis - (alcoholic hepatitis)  Endo/Other    Renal/GU      Musculoskeletal   Abdominal   Peds  Hematology   Anesthesia Other Findings   Reproductive/Obstetrics                             Anesthesia Physical Anesthesia Plan  ASA: III  Anesthesia Plan: MAC   Post-op Pain Management:    Induction: Intravenous  PONV Risk Score and Plan:   Airway Management Planned: Simple Face Mask  Additional Equipment:   Intra-op Plan:   Post-operative Plan:   Informed Consent: I have reviewed the patients History and Physical, chart, labs and discussed the procedure including the risks, benefits and alternatives for the proposed anesthesia with the patient or authorized representative who has indicated his/her understanding and acceptance.     Plan Discussed with:   Anesthesia Plan Comments:         Anesthesia Quick Evaluation

## 2017-03-15 NOTE — Progress Notes (Signed)
Dr Jena Gaussourk at bedside to check pt re: stomach burning. Order given.

## 2017-03-15 NOTE — Discharge Instructions (Signed)
EGD Discharge instructions Please read the instructions outlined below and refer to this sheet in the next few weeks. These discharge instructions provide you with general information on caring for yourself after you leave the hospital. Your doctor may also give you specific instructions. While your treatment has been planned according to the most current medical practices available, unavoidable complications occasionally occur. If you have any problems or questions after discharge, please call your doctor. ACTIVITY  You may resume your regular activity but move at a slower pace for the next 24 hours.   Take frequent rest periods for the next 24 hours.   Walking will help expel (get rid of) the air and reduce the bloated feeling in your abdomen.   No driving for 24 hours (because of the anesthesia (medicine) used during the test).   You may shower.   Do not sign any important legal documents or operate any machinery for 24 hours (because of the anesthesia used during the test).  NUTRITION  Drink plenty of fluids.   You may resume your normal diet.   Begin with a light meal and progress to your normal diet.   Avoid alcoholic beverages for 24 hours or as instructed by your caregiver.  MEDICATIONS  You may resume your normal medications unless your caregiver tells you otherwise.  WHAT YOU CAN EXPECT TODAY  You may experience abdominal discomfort such as a feeling of fullness or gas pains.  FOLLOW-UP  Your doctor will discuss the results of your test with you.  SEEK IMMEDIATE MEDICAL ATTENTION IF ANY OF THE FOLLOWING OCCUR:  Excessive nausea (feeling sick to your stomach) and/or vomiting.   Severe abdominal pain and distention (swelling).   Trouble swallowing.   Temperature over 101 F (37.8 C).   Rectal bleeding or vomiting of blood.    Soft diet today; advance diet as tolerated tomorrow  Keep office visit as planned.  Repeat EGD in 12-18 months from  now   Soft-Food Meal Plan A soft-food meal plan includes foods that are safe and easy to swallow. This meal plan typically is used:  If you are having trouble chewing or swallowing foods.  As a transition meal plan after only having had liquid meals for a long period.  What do I need to know about the soft-food meal plan? A soft-food meal plan includes tender foods that are soft and easy to chew and swallow. In most cases, bite-sized pieces of food are easier to swallow. A bite-sized piece is about  inch or smaller. Foods in this plan do not need to be ground or pureed. Foods that are very hard, crunchy, or sticky should be avoided. Also, breads, cereals, yogurts, and desserts with nuts, seeds, or fruits should be avoided. What foods can I eat? Grains Rice and wild rice. Moist bread, dressing, pasta, and noodles. Well-moistened dry or cooked cereals, such as farina (cooked wheat cereal), oatmeal, or grits. Biscuits, breads, muffins, pancakes, and waffles that have been well moistened. Vegetables Shredded lettuce. Cooked, tender vegetables, including potatoes without skins. Vegetable juices. Broths or creamed soups made with vegetables that are not stringy or chewy. Strained tomatoes (without seeds). Fruits Canned or well-cooked fruits. Soft (ripe), peeled fresh fruits, such as peaches, nectarines, kiwi, cantaloupe, honeydew melon, and watermelon (without seeds). Soft berries with small seeds, such as strawberries. Fruit juices (without pulp). Meats and Other Protein Sources Moist, tender, lean beef. Mutton. Lamb. Veal. Chicken. [place]. Liver. Ham. Fish without bones. Eggs. Dairy Milk, milk drinks, and  cream. Plain cream cheese and cottage cheese. Plain yogurt. Sweets/Desserts Flavored gelatin desserts. Custard. Plain ice cream, frozen yogurt, sherbet, milk shakes, and malts. Plain cakes and cookies. Plain hard candy. Other Butter, margarine (without trans fat), and cooking oils.  Mayonnaise. Cream sauces. Mild spices, salt, and sugar. Syrup, molasses, honey, and jelly. The items listed above may not be a complete list of recommended foods or beverages. Contact your dietitian for more options. What foods are not recommended? Grains Dry bread, toast, crackers that have not been moistened. Coarse or dry cereals, such as bran, granola, and shredded wheat. Tough or chewy crusty breads, such as [place] bread or baguettes. Vegetables Corn. Raw vegetables except shredded lettuce. Cooked vegetables that are tough or stringy. Tough, crisp, fried potatoes and potato skins. Fruits Fresh fruits with skins or seeds or both, such as apples, pears, or grapes. Stringy, high-pulp fruits, such as papaya, pineapple, coconut, or mango. Fruit leather, fruit roll-ups, and all dried fruits. Meats and Other Protein Sources Sausages and hot dogs. Meats with gristle. Fish with bones. Nuts, seeds, and chunky peanut or other nut butters. Sweets/Desserts Cakes or cookies that are very dry or chewy. The items listed above may not be a complete list of foods and beverages to avoid. Contact your dietitian for more information. This information is not intended to replace advice given to you by your health care provider. Make sure you discuss any questions you have with your health care provider. Document Released: 12/22/2007 Document Revised: 02/20/2016 Document Reviewed: 08/11/2013 Elsevier Interactive Patient Education  2017 [employer].

## 2017-03-15 NOTE — Anesthesia Procedure Notes (Signed)
Procedure Name: MAC Date/Time: 03/15/2017 9:04 AM Performed by: Andree Elk, AMY A Pre-anesthesia Checklist: Patient identified, Emergency Drugs available, Suction available, Patient being monitored and Timeout performed Oxygen Delivery Method: Simple face mask

## 2017-03-15 NOTE — Interval H&P Note (Signed)
History and Physical Interval Note:  03/15/2017 9:00 AM  Joseph Cortez  has presented today for surgery, with the diagnosis of variceal surveillance  The various methods of treatment have been discussed with the patient and family. After consideration of risks, benefits and other options for treatment, the patient has consented to  Procedure(s) with comments: ESOPHAGOGASTRODUODENOSCOPY (EGD) WITH PROPOFOL (N/A) - 945  as a surgical intervention .  The patient's history has been reviewed, patient examined, no change in status, stable for surgery.  I have reviewed the patient's chart and labs.  Questions were answered to the patient's satisfaction.     Joseph Cortez  No change. EGD for surveillance. If recurrent varices, will perform EBL per plan.  The risks, benefits, limitations, alternatives and imponderables have been reviewed with the patient. Potential for esophageal dilation, biopsy, etc. have also been reviewed.  Questions have been answered. All parties agreeable.

## 2017-03-15 NOTE — Op Note (Signed)
[facility] Patient Name: Joseph Cortez Procedure Date: 03/15/2017 8:58 AM MRN: [id number] Date of Birth: [birth year] Attending MD: Joseph Pac , MD CSN: [id number] Age: 51 Admit Type: Outpatient Procedure:                Upper GI endoscopy Indications:              Surveillance procedure Providers:                Joseph Pac, MD, Jannett Celestine, RN, Burke Keels, Technician Referring MD:              Medicines:                Propofol per Anesthesia Complications:            No immediate complications. Estimated Blood Loss:     Estimated blood loss: none. Procedure:                Pre-Anesthesia Assessment:                           - Prior to the procedure, a History and Physical                            was performed, and patient medications and                            allergies were reviewed. The patient's tolerance of                            previous anesthesia was also reviewed. The risks                            and benefits of the procedure and the sedation                            options and risks were discussed with the patient.                            All questions were answered, and informed consent                            was obtained. Prior Anticoagulants: The patient has                            taken no previous anticoagulant or antiplatelet                            agents. ASA Grade Assessment: III - A patient with                            severe systemic disease. After reviewing the risks  and benefits, the patient was deemed in                            satisfactory condition to undergo the procedure.                           After obtaining informed consent, the endoscope was                            passed under direct vision. Throughout the                            procedure, the patient's blood pressure, pulse, and                            oxygen  saturations were monitored continuously. The                            [id number]-iL0 ([id number]) scope was introduced through the                            and advanced to the second part of duodenum. The                            upper GI endoscopy was accomplished without                            difficulty. The patient tolerated the procedure                            well. Scope In: 9:13:45 AM Scope Out: 9:25:03 AM Total Procedure Duration: 0 hours 11 minutes 18 seconds  Findings:      Varices were found . 4 columns. Grade 2. Cherry red spots overlying one       of the columns distally.      Portal hypertensive gastropathy was found in the entire examined stomach.      The duodenal bulb, second portion of the duodenum and examined duodenum       were normal. 7 shot bander applied to the scope. Reintroduced into the       esophagus. 5 bands placed successfully on 4 columns. Good hemostasis       maintained. There was no bleeding during the procedure. Impression:               banded                           - Esophageal varices. Completely eradicated.                            Treatment successful.                           - Portal hypertensive gastropathy.                           - Normal duodenal bulb,  second portion of the                            duodenum and examined duodenum.                           - No specimens collected. Moderate Sedation:      Moderate (conscious) sedation was personally administered by an       anesthesia professional. The following parameters were monitored: oxygen       saturation, heart rate, blood pressure, respiratory rate, EKG, adequacy       of pulmonary ventilation, and response to care. Total physician       intraservice time was 19 minutes. Recommendation:           - Patient has a contact number available for                            emergencies. The signs and symptoms of potential                            delayed complications  were discussed with the                            patient. Return to normal activities tomorrow.                            Written discharge instructions were provided to the                            patient.                           - Resume previous diet.                           - Continue present medications.                           - Repeat upper endoscopy in 1.5 years for                            surveillance.                           - Return to GI clinic in 4 weeks. Procedure Code(s):        --- Professional ---                           [phone], Esophagogastroduodenoscopy, flexible,                            transoral; with band ligation of esophageal/gastric                            varices Diagnosis Code(s):        --- Professional ---  I85.00, Esophageal varices without bleeding CPT copyright 2016 American Medical Association. All rights reserved. The codes documented in this report are preliminary and upon coder review may  be revised to meet current compliance requirements. Joseph Friendsobert M. Traivon Morrical, MD Joseph Pacobert Benigno Refujio Haymer, MD 03/15/2017 9:34:02 AM This report has been signed electronically. Number of Addenda: 0

## 2017-03-15 NOTE — Progress Notes (Signed)
Awake. C/O stomach "burning". Denies nausea.

## 2017-03-15 NOTE — Transfer of Care (Signed)
Immediate Anesthesia Transfer of Care Note  Patient: Joseph Cortez  Procedure(s) Performed: Procedure(s) with comments: ESOPHAGOGASTRODUODENOSCOPY (EGD) WITH PROPOFOL (N/A) - 945   Patient Location: PACU  Anesthesia Type:MAC  Level of Consciousness: awake, alert , oriented and patient cooperative  Airway & Oxygen Therapy: Patient Spontanous Breathing  Post-op Assessment: Report given to RN and Post -op Vital signs reviewed and stable  Post vital signs: Reviewed and stable  Last Vitals:  Vitals:   03/15/17 0859 03/15/17 0900  BP:  96/67  Pulse:    Resp: 12 (!) 22  Temp:      Last Pain:  Vitals:   03/15/17 0831  TempSrc: Oral  PainSc: 3       Patients Stated Pain Goal: 4 (18/29/93 7169)  Complications: No apparent anesthesia complications

## 2017-03-16 ENCOUNTER — Other Ambulatory Visit: Payer: Self-pay

## 2017-03-17 ENCOUNTER — Other Ambulatory Visit: Payer: Self-pay | Admitting: Gastroenterology

## 2017-03-17 ENCOUNTER — Ambulatory Visit (HOSPITAL_COMMUNITY)
Admission: RE | Admit: 2017-03-17 | Discharge: 2017-03-17 | Disposition: A | Discharge status: Home / Self Care | Payer: Medicaid - Out of State | Source: Ambulatory Visit | Attending: Gastroenterology | Admitting: Gastroenterology

## 2017-03-17 DIAGNOSIS — R188 Other ascites: Secondary | ICD-10-CM | POA: Insufficient documentation

## 2017-03-17 MED ORDER — ALBUMIN HUMAN 25 % IV SOLN
25.0000 g | Freq: Once | INTRAVENOUS | Status: AC
Start: 2017-03-17 — End: 2017-03-17
  Administered 2017-03-17: 25 g via INTRAVENOUS

## 2017-03-17 MED ORDER — ALBUMIN HUMAN 25 % IV SOLN: 50.0000 g | Freq: Once | INTRAVENOUS | Status: DC

## 2017-03-17 MED ORDER — LACTULOSE 10 GM/15ML PO SOLN: ORAL | 19 refills | Status: DC

## 2017-03-17 MED ORDER — ALBUMIN HUMAN 25 % IV SOLN
INTRAVENOUS | Status: AC
  Administered 2017-03-17: 25 g via INTRAVENOUS
  Filled 2017-03-17: qty 100

## 2017-03-17 NOTE — Procedures (Signed)
   US guided RLQ paracentesis  4.5 L yellow fluid obtained Tolerated well  No labs per MD IV Albumin 25 gr during procedure

## 2017-03-17 NOTE — Progress Notes (Deleted)
Pt declines IV and Albumin.

## 2017-03-17 NOTE — Progress Notes (Signed)
Paracentesis complete no signs of distress. 4.5L yellow colored ascites removed.  

## 2017-03-18 ENCOUNTER — Encounter (HOSPITAL_COMMUNITY): Payer: Self-pay | Admitting: Internal Medicine

## 2017-03-24 ENCOUNTER — Ambulatory Visit (HOSPITAL_COMMUNITY): Payer: Medicaid - Out of State

## 2017-03-25 ENCOUNTER — Telehealth: Payer: Self-pay

## 2017-03-25 NOTE — Telephone Encounter (Signed)
T/[facility] [place] , said pt is asking for larger quantity of Lactulose, he is having to go to pharmacy weekly to pick up.   I spoke to Lewie LoronAnna Boone, NP who said OK to dispense quantity sufficient for the month and Drinda Buttsnnette is aware.

## 2017-03-26 ENCOUNTER — Telehealth: Payer: Self-pay | Admitting: *Deleted

## 2017-03-26 ENCOUNTER — Encounter (HOSPITAL_COMMUNITY): Payer: Self-pay

## 2017-03-26 ENCOUNTER — Ambulatory Visit (HOSPITAL_COMMUNITY)
Admission: RE | Admit: 2017-03-26 | Discharge: 2017-03-26 | Disposition: A | Discharge status: Home / Self Care | Payer: Medicaid - Out of State | Source: Ambulatory Visit | Attending: Gastroenterology | Admitting: Gastroenterology

## 2017-03-26 ENCOUNTER — Other Ambulatory Visit: Payer: Self-pay | Admitting: Gastroenterology

## 2017-03-26 DIAGNOSIS — K7031 Alcoholic cirrhosis of liver with ascites: Secondary | ICD-10-CM | POA: Diagnosis present

## 2017-03-26 DIAGNOSIS — R188 Other ascites: Secondary | ICD-10-CM | POA: Insufficient documentation

## 2017-03-26 MED ORDER — HYDROXYZINE PAMOATE 25 MG PO CAPS: 25.0000 mg | ORAL_CAPSULE | Freq: Three times a day (TID) | ORAL | 0 refills | Status: DC | PRN

## 2017-03-26 NOTE — Sedation Documentation (Signed)
Pt tolerated procedure well.  3800 cc fluid removed.

## 2017-03-26 NOTE — Telephone Encounter (Signed)
Called pt, aware. 

## 2017-03-26 NOTE — Procedures (Signed)
PreOperative Dx: Alcoholic cirrhosis, ascites Postoperative Dx: Alcoholic cirrhosis, ascites Procedure:   US guided paracentesis Radiologist:  Haneen Bernales Anesthesia:  10 ml of1% lidocaine Specimen:  3.8 L of yellow ascitic fluid EBL:   < 1 ml Complications: None  

## 2017-03-26 NOTE — Telephone Encounter (Signed)
Patient left message on nurses line requesting a refill on his vistaril, patient asked to be called back once this is refilled so he knows which pharmacy to go too. [phone]

## 2017-04-01 ENCOUNTER — Other Ambulatory Visit: Payer: Self-pay

## 2017-04-01 ENCOUNTER — Telehealth: Payer: Self-pay | Admitting: Family Medicine

## 2017-04-01 MED ORDER — LACTULOSE 10 GM/15ML PO SOLN: ORAL | 3 refills | Status: DC

## 2017-04-01 NOTE — Telephone Encounter (Signed)
Call Trey PaulaJeff back, informed him of Dr Lindaann SloughNelson's note below. He states that the patient paid OOP for meds today but he will call patient and let him know that finding a new PCP will be best

## 2017-04-01 NOTE — Telephone Encounter (Signed)
Trey PaulaJeff from [facility] [place], [place] called regarding [place]. Phone number is [phone]   [facility], and since Dr Delton SeeNelson is not a [place] provider, medications are not being paid for by the Medicaid. Pharm wants to know if there is a provider here (maybe Dr Lodema HongSimpson) who [place] so that the medications can be placed under another providers name so that the medications can be paid [facility] by [facility].

## 2017-04-01 NOTE — Telephone Encounter (Signed)
No.  He will have to find a new PCP.

## 2017-04-02 ENCOUNTER — Ambulatory Visit (HOSPITAL_COMMUNITY)
Admission: RE | Admit: 2017-04-02 | Discharge: 2017-04-02 | Disposition: A | Discharge status: Home / Self Care | Payer: Medicaid - Out of State | Source: Ambulatory Visit | Attending: Gastroenterology | Admitting: Gastroenterology

## 2017-04-05 ENCOUNTER — Ambulatory Visit (HOSPITAL_COMMUNITY)
Admission: RE | Admit: 2017-04-05 | Discharge: 2017-04-05 | Disposition: A | Discharge status: Home / Self Care | Payer: Medicaid - Out of State | Source: Ambulatory Visit | Attending: Gastroenterology | Admitting: Gastroenterology

## 2017-04-05 ENCOUNTER — Telehealth: Payer: Self-pay | Admitting: Gastroenterology

## 2017-04-05 NOTE — Telephone Encounter (Signed)
[facility]. He has Medicaid now.

## 2017-04-06 ENCOUNTER — Other Ambulatory Visit: Payer: Self-pay

## 2017-04-06 ENCOUNTER — Telehealth: Payer: Self-pay | Admitting: Gastroenterology

## 2017-04-06 ENCOUNTER — Other Ambulatory Visit: Payer: Self-pay | Admitting: Gastroenterology

## 2017-04-06 DIAGNOSIS — K7031 Alcoholic cirrhosis of liver with ascites: Secondary | ICD-10-CM

## 2017-04-06 NOTE — Telephone Encounter (Signed)
Joseph FanningJulie:  Patient requires frequent paracenteses. It has been awhile since I have been able to calculate his MELD (a few months). I would like to get an updated score. Could we have a CMP and INR ordered so that he can get with his next paracentesis?

## 2017-04-06 NOTE — Telephone Encounter (Signed)
[facility] called and said that they verified pt's Medicaid and it only covers behavioral health so they would not be able to process referral. Gave the lady pt's sister's phone number to call to see if she could get more information before closing the referral.

## 2017-04-06 NOTE — Telephone Encounter (Signed)
Lab orders done for the hospital lab. I tried to call pts sister- [name] x2, both times it sounded like her phone picked up and then lost the call.

## 2017-04-06 NOTE — Telephone Encounter (Signed)
[facility].

## 2017-04-07 ENCOUNTER — Encounter (HOSPITAL_COMMUNITY): Payer: Self-pay

## 2017-04-07 ENCOUNTER — Other Ambulatory Visit (HOSPITAL_COMMUNITY)
Admission: RE | Admit: 2017-04-07 | Discharge: 2017-04-07 | Disposition: A | Discharge status: Home / Self Care | Payer: Medicaid - Out of State | Source: Ambulatory Visit | Attending: Gastroenterology | Admitting: Gastroenterology

## 2017-04-07 ENCOUNTER — Ambulatory Visit (HOSPITAL_COMMUNITY)
Admission: RE | Admit: 2017-04-07 | Discharge: 2017-04-07 | Disposition: A | Discharge status: Home / Self Care | Payer: Medicaid - Out of State | Source: Ambulatory Visit | Attending: Gastroenterology | Admitting: Gastroenterology

## 2017-04-07 ENCOUNTER — Other Ambulatory Visit: Payer: Self-pay | Admitting: Gastroenterology

## 2017-04-07 DIAGNOSIS — R188 Other ascites: Secondary | ICD-10-CM

## 2017-04-07 DIAGNOSIS — K7031 Alcoholic cirrhosis of liver with ascites: Secondary | ICD-10-CM | POA: Diagnosis not present

## 2017-04-07 LAB — COMPREHENSIVE METABOLIC PANEL
ALK PHOS: 98 U/L (ref 38–126)
ALT: 38 U/L (ref 17–63)
ANION GAP: 9 (ref 5–15)
AST: 43 U/L — ABNORMAL HIGH (ref 15–41)
Albumin: 3.7 g/dL (ref 3.5–5.0)
BUN: 10 mg/dL (ref 6–20)
CALCIUM: 9.4 mg/dL (ref 8.9–10.3)
CHLORIDE: 99 mmol/L — AB (ref 101–111)
CO2: 25 mmol/L (ref 22–32)
CREATININE: 0.8 mg/dL (ref 0.61–1.24)
Glucose, Bld: 118 mg/dL — ABNORMAL HIGH (ref 65–99)
Potassium: 4.2 mmol/L (ref 3.5–5.1)
Sodium: 133 mmol/L — ABNORMAL LOW (ref 135–145)
Total Bilirubin: 1.7 mg/dL — ABNORMAL HIGH (ref 0.3–1.2)
Total Protein: 7.3 g/dL (ref 6.5–8.1)

## 2017-04-07 LAB — PROTIME-INR
INR: 1.44
PROTHROMBIN TIME: 17.7 s — AB (ref 11.4–15.2)

## 2017-04-07 NOTE — Progress Notes (Signed)
Paracentesis complete no signs of distress. 4.7L cloudy yellow colored asites removed.

## 2017-04-07 NOTE — Telephone Encounter (Signed)
pts sister is aware. 

## 2017-04-07 NOTE — Telephone Encounter (Signed)
Orders have been faxed to the hospital lab.

## 2017-04-07 NOTE — Progress Notes (Signed)
LFTs stable. INR very slightly increased from last evaluated but still around baseline. MELD 17.

## 2017-04-07 NOTE — Procedures (Signed)
   US guided RLQ paracentesis 4.7 cloudy yellow fluid obtained No labs sent  Tolerated well

## 2017-04-12 NOTE — Telephone Encounter (Signed)
Received letter from [facility]. Referral was closed d/t patient doesn't have active insurance coverage. Routing to AB as FYI.

## 2017-04-14 ENCOUNTER — Ambulatory Visit (HOSPITAL_COMMUNITY): Payer: Medicaid - Out of State

## 2017-04-19 ENCOUNTER — Ambulatory Visit (HOSPITAL_COMMUNITY): Payer: Medicaid - Out of State

## 2017-04-20 ENCOUNTER — Other Ambulatory Visit: Payer: Self-pay

## 2017-04-20 MED ORDER — ONDANSETRON HCL 4 MG PO TABS
4.0000 mg | ORAL_TABLET | Freq: Three times a day (TID) | ORAL | 5 refills | Status: DC | PRN
Start: 2017-04-20 — End: 2017-06-02

## 2017-04-20 NOTE — Telephone Encounter (Signed)
Noted. I have spoken with Dr. Grace IsaacWatts with IR regarding possible TIPS candidacy. His MELD is elevated at 17 and not an ideal candidate at this time; however, Dr. Grace IsaacWatts stated we could refer for patient to discuss this procedure and look at potential candidacy in the future if remains abstinent from drinking and MELD improves. Please let patient/sister know that we can refer them. Would they like to pursue this? This does not mean he would have the procedure. This is simply to discuss.

## 2017-04-21 ENCOUNTER — Other Ambulatory Visit: Payer: Self-pay | Admitting: Gastroenterology

## 2017-04-21 ENCOUNTER — Ambulatory Visit (HOSPITAL_COMMUNITY)
Admission: RE | Admit: 2017-04-21 | Discharge: 2017-04-21 | Disposition: A | Discharge status: Home / Self Care | Payer: Medicaid - Out of State | Source: Ambulatory Visit | Attending: Gastroenterology | Admitting: Gastroenterology

## 2017-04-21 DIAGNOSIS — R188 Other ascites: Secondary | ICD-10-CM | POA: Diagnosis present

## 2017-04-21 NOTE — Telephone Encounter (Signed)
Sounds good

## 2017-04-21 NOTE — Telephone Encounter (Signed)
Called and informed pt. He said that AB had already spoke to his sister. He wants to wait for now and see how he does. He will be seeing AB 04/28/17.

## 2017-04-21 NOTE — Procedures (Signed)
PROCEDURE SUMMARY:  Successful US guided paracentesis from right lateral abdomen.  Yielded 3 liters of clear yellow fluid.  No immediate complications.  Pt tolerated well.   Catherina Pates S Gradie Butrick PA-C 04/21/2017 11:30 AM

## 2017-04-28 ENCOUNTER — Other Ambulatory Visit: Payer: Self-pay

## 2017-04-28 ENCOUNTER — Encounter: Payer: Self-pay | Admitting: Gastroenterology

## 2017-04-28 ENCOUNTER — Encounter (HOSPITAL_COMMUNITY): Payer: Self-pay

## 2017-04-28 ENCOUNTER — Ambulatory Visit (INDEPENDENT_AMBULATORY_CARE_PROVIDER_SITE_OTHER): Payer: Medicaid - Out of State | Admitting: Gastroenterology

## 2017-04-28 ENCOUNTER — Ambulatory Visit (HOSPITAL_COMMUNITY): Payer: Medicaid - Out of State

## 2017-04-28 ENCOUNTER — Telehealth: Payer: Self-pay

## 2017-04-28 VITALS — BP 117/76 | HR 83 | Temp 97.4°F | Ht 68.0 in | Wt 200.2 lb

## 2017-04-28 DIAGNOSIS — K7031 Alcoholic cirrhosis of liver with ascites: Secondary | ICD-10-CM | POA: Diagnosis not present

## 2017-04-28 DIAGNOSIS — K625 Hemorrhage of anus and rectum: Secondary | ICD-10-CM

## 2017-04-28 NOTE — Patient Instructions (Signed)
We have arranged a colonoscopy for you with Dr. Jena Gaussourk.  I will see you in 3 months!  Let me know if you need anything!

## 2017-04-28 NOTE — Telephone Encounter (Signed)
Called and informed pt of pre-op appt 05/19/17 at 12:45pm. Letter also mailed.

## 2017-04-28 NOTE — Progress Notes (Signed)
Referring Provider: Eustace MooreNelson, Yvonne Sue, MD Primary Care Physician:  Eustace MooreNelson, Yvonne Sue, MD Primary GI: Dr. Jena Gaussourk    Chief Complaint  Patient presents with  . Cirrhosis  . Abdominal Pain    HPI:   Joseph Cortez is a 51 y.o. male presenting today with a history of ETOH cirrhosis, chronic need for LVAPs, history of variceal bleeding in early 2017 at an outside hospital, intolerant to non-selective beta-blocker therapy, and umbilical and right inguinal hernia. 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 Due for surveillance in Dec 2019.   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 Goes to [employer] meetings every morning, which is new from last visit. Has abdominal pain lower abdomen and groin due to known hernias, chronic. No confusion or mental status changes. His need for paracenteses has decreased some with increased compliance with diuretic therapy and cessation of ETOH. Discussion of TIPS had been raised due to weekly paracenteses, but his MELD was 17 recently. After discussion with IR, if he were to continue with alcohol cessation and MELD were to improve, could discuss with him risks and benefits of TIPS in setting of frequent paracenteses. At this point, he has had less frequent visit for this and doing much better than prior visits.   He notes that he had blood in stool recently. This is not his first incidence. We had discussed colonoscopy previously but had to postpone due to medical issues and weakness. He appears to be improved from prior visits and has more energy. Currently taking Lasix 40 mg and Aldactone 100 mg daily. [facility] September by both General Surgery and Transplant. He has Medicaid of [place], which has previously said they would not cover transplant services. His sister, Joseph Cortez, is present and in process of finding out more information.   Past Medical History:    Diagnosis Date  . AKI (acute kidney injury) (HCC) 07/14/2016  . Anemia   . Anxiety   . Arthritis   . Blood transfusion without reported diagnosis   . Cirrhosis (HCC)   . COPD (chronic obstructive pulmonary disease) (HCC)   . Depression   . Hypokalemia 07/14/2016  . Hyponatremia 07/14/2016  . Neuromuscular disorder (HCC)    Neuropathy  . Neuropathy   . Substance abuse   . Ulcer    BLEEDING VARICES, ULCERATIONS    Past Surgical History:  Procedure Laterality Date  . BIOPSY  06/27/2015   Procedure: BIOPSY (Gastric);  Surgeon: Corbin Adeobert M Rourk, MD;  Location: [facility];  Service: Endoscopy;;  . CHOLECYSTECTOMY    . ESOPHAGEAL BANDING N/A 01/30/2016   Procedure: ESOPHAGEAL BANDING;  Surgeon: Corbin Adeobert M Rourk, MD;  Location: [facility] END[facility] SUITE;  Service: Endoscopy;  Laterality: N/A;  . ESOPHAGOGASTRODUODENOSCOPY  January 06, 2016   Dr. Aly[facility]Koch at [facility]: duodenitis in bullb, portal gastropathy, 2 columns of large distal esophageal varices, one with flat red spot, s/p band ligation. Needs 4 week surveillance EGD   . ESOPHAGOGASTRODUODENOSCOPY (EGD) WITH PROPOFOL N/A 06/27/2015   [facility]:mild erosive reflux esophagitis, portal gastropathy, duodenal bulbar ulcer, negative H.pylori. Surveillance due yearly according to ASGE current guidelines regarding ETOH cirrhosis and ongoing ETOH abuse  . ESOPHAGOGASTRODUODENOSCOPY (EGD) WITH PROPOFOL N/A 01/30/2016   Dr. Jena Gaussourk: Grade 2 varices s/p banding with complete deflation of varices, portal gastropathy, surveillance in May 2018  . 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 cherry red spots overlying one of the columns distally. portal gastropathy. EGD in 1.5 years     Current Outpatient Prescriptions  Medication Sig Dispense Refill  . bismuth subsalicylate (PEPTO BISMOL) 262 MG/15ML suspension Take 30 mLs by mouth every 6 (six) hours as needed (for diarrhea/loose stools or upset  stomach.).     Marland Kitchen. furosemide (LASIX) 20 MG tablet Take 2 tablets (40 mg total) by mouth daily. 60 tablet 3  . gabapentin (NEURONTIN) 300 MG capsule Take 300-600 mg by mouth See admin instructions. 300mg  in the morning, 300mg  at 4 pm, and 600mg  at bedtime    . lactulose (CHRONULAC) 10 GM/15ML solution TAKE 45 ML BY MOUTH 2 to 3 times daily 4000 mL 3  . Multiple Vitamin (MULTIVITAMIN WITH MINERALS) TABS tablet Take 1 tablet by mouth daily.    . ondansetron (ZOFRAN) 4 MG tablet Take 1 tablet (4 mg total) by mouth every 8 (eight) hours as needed for nausea or vomiting. 30 tablet 5  . pantoprazole (PROTONIX) 40 MG tablet Take 1 tablet (40 mg total) by mouth 2 (two) times daily before a meal. (Patient taking differently: Take 40 mg by mouth See admin instructions. TAKE 1 TABLET (40 MG) BY MOUTH ONCE DAILY (SCHEDULED); THEN TAKE AN ADDITIONAL DOSE IF NEEDED) 180 tablet 3  . spironolactone (ALDACTONE) 50 MG tablet Take 2 tablets (100 mg total) by mouth daily. 60 tablet 3   No current facility-administered medications for this visit.     Allergies as of 04/28/2017 - Review Complete 04/28/2017  Allergen Reaction Noted  . Sertraline Other (See Comments) 01/07/2016    Family History  Problem Relation Age of Onset  . Colon cancer Maternal Uncle   . Alcohol abuse Maternal Uncle   . Arthritis Mother   . Cancer Mother        breast cancer  . Hypertension Father   . Hyperlipidemia Father   . Lupus Sister   . Alcohol abuse Paternal Uncle   . Alzheimer's disease Maternal Grandmother   . COPD Maternal Grandfather   . Diabetes Maternal Grandfather   . Heart disease Paternal Grandfather   . Alcohol abuse Cousin     Social History   Social History  . Marital status: Divorced    Spouse name: N/A  . Number of children: 2  . Years of education: 11   Occupational History  . disability     former truck driver   Social History Main Topics  . Smoking status: Current Every Day Smoker    Types:  Cigarettes  . Smokeless tobacco: Never Used     Comment: 3-4 cigarettes daily   . Alcohol use 16.8 oz/week    28 Glasses of wine per week     Comment: last alcohol   . Drug use: No     Comment: cocaine historically, several years ago  . Sexual activity: Yes    Birth control/ protection: None   Other Topics Concern  . None   Social History Narrative   Lives alone   Lives near sister   No reg exercise   Disabled for 3 years    Review of Systems: Gen: Denies fever, chills, anorexia. Denies fatigue, weakness, weight loss.  CV: Denies chest pain, palpitations, syncope, peripheral edema, and claudication. Resp: Denies dyspnea at rest, cough, wheezing, coughing up blood, and pleurisy. GI: see HPI  Derm: Denies rash, itching, dry skin Psych: Denies depression, anxiety, memory  loss, confusion. No homicidal or suicidal ideation.  Heme: see HPI   Physical Exam: BP 117/76   Pulse 83   Temp (!) 97.4 F (36.3 C) (Oral)   Ht 5\' 8"  (1.727 m)   Wt 200 lb 3.2 oz (90.8 kg)   BMI 30.44 kg/m  General:   Alert and oriented. No distress noted. Pleasant and cooperative.  Head:  Normocephalic and atraumatic. Eyes:  Conjuctiva clear without scleral icterus. Mouth:  Oral mucosa pink and moist. Good dentition. No lesions. Heart:  S1, S2 present without murmurs Abdomen:  +BS, soft, moderate ascites but non-tense, protruding umbilical hernia.   Msk:  Symmetrical without gross deformities. Normal posture. Extremities:  Without edema. Neurologic:  Alert and  oriented x4;  grossly normal neurologically. Psych:  Alert and cooperative. Normal mood and affect.  Lab Results  Component Value Date   ALT 38 04/07/2017   AST 43 (H) 04/07/2017   ALKPHOS 98 04/07/2017   BILITOT 1.7 (H) 04/07/2017   Lab Results  Component Value Date   CREATININE 0.80 04/07/2017   BUN 10 04/07/2017   NA 133 (L) 04/07/2017   K 4.2 04/07/2017   CL 99 (L) 04/07/2017   CO2 25 04/07/2017   Lab Results  Component  Value Date   WBC 8.2 03/10/2017   HGB 14.8 03/10/2017   HCT 43.2 03/10/2017   MCV 91.1 03/10/2017   PLT 140 (L) 03/10/2017

## 2017-04-30 ENCOUNTER — Encounter: Payer: Self-pay | Admitting: Gastroenterology

## 2017-04-30 NOTE — Assessment & Plan Note (Addendum)
ETOH cirrhosis, abstaining from alcohol since May 9th. Tolerating Lasix 40 mg and Aldactone 100 mg, with decreased need for weekly paracenteses in setting of ETOH abstinence and diuretic management. EGD up-to-date and due again in Dec 2019 for surveillance.  Continue lactulose dosing and Xifaxan 3 month return.  Absolute avoidance of ETOH Smoking cessation discussed as well Keep appts with [facility]urg at [facility] and Transplant team in Sept 2018

## 2017-04-30 NOTE — Assessment & Plan Note (Addendum)
Pleasant 51 year old male with history of ETOH cirrhosis, no prior colonoscopy, with intermittent low-volume hematochezia. Needs diagnostic colonoscopy in near future.  Proceed with TCS with Dr. Jena Gaussourk in near future: the risks, benefits, and alternatives have been discussed with the patient in detail. The patient states understanding and desires to proceed. PROPOFOL due to history of ETOH use

## 2017-05-03 ENCOUNTER — Encounter: Payer: Self-pay | Admitting: Gastroenterology

## 2017-05-03 NOTE — Progress Notes (Signed)
cc'ed to pcp °

## 2017-05-05 ENCOUNTER — Ambulatory Visit (HOSPITAL_COMMUNITY)
Admission: RE | Admit: 2017-05-05 | Discharge: 2017-05-05 | Disposition: A | Discharge status: Home / Self Care | Payer: Medicaid - Out of State | Source: Ambulatory Visit | Attending: Gastroenterology | Admitting: Gastroenterology

## 2017-05-05 ENCOUNTER — Other Ambulatory Visit (HOSPITAL_COMMUNITY): Payer: Medicaid - Out of State

## 2017-05-05 DIAGNOSIS — R188 Other ascites: Secondary | ICD-10-CM | POA: Diagnosis not present

## 2017-05-05 NOTE — Procedures (Signed)
   US guided RLQ paracentesis  3.6 Liters cloudy yellow fluid  Tolerated well

## 2017-05-05 NOTE — Progress Notes (Signed)
Paracentesis complete no signs of distress. 3.6L yellow colored ascites removed.

## 2017-05-07 ENCOUNTER — Telehealth: Payer: Self-pay

## 2017-05-07 NOTE — Telephone Encounter (Signed)
Yes, please do.

## 2017-05-07 NOTE — Telephone Encounter (Signed)
Pt's sister Lynden Ang(Cathy) called office and requested another referral be sent to [facility] for liver transplant. She said they got things straight with his insurance. [facility]'s fax number is [phone] and they need another referral to be sent.  Tobi Bastosnna, is it ok to send another referral to [facility] for liver transplant.

## 2017-05-10 ENCOUNTER — Other Ambulatory Visit: Payer: Self-pay

## 2017-05-10 DIAGNOSIS — K7460 Unspecified cirrhosis of liver: Secondary | ICD-10-CM

## 2017-05-10 NOTE — Telephone Encounter (Signed)
Referral info faxed to [facility] for [facility].

## 2017-05-12 ENCOUNTER — Telehealth: Payer: Self-pay

## 2017-05-12 NOTE — Telephone Encounter (Signed)
Patient states hernia has gotten bigger and he was referred to [facility] earlier in the year but he didn't want to operate on it until he had to but now its grown larger and causing problems. Requested asap appt to get a referral back to surgeon.

## 2017-05-13 ENCOUNTER — Telehealth: Payer: Self-pay

## 2017-05-13 NOTE — Telephone Encounter (Signed)
Called patient regarding message below. No answer, left generic message for patient to return call.   

## 2017-05-13 NOTE — Telephone Encounter (Signed)
Great!

## 2017-05-13 NOTE — Telephone Encounter (Signed)
Does not need referral for follow up.  He can call for appt.

## 2017-05-13 NOTE — Telephone Encounter (Signed)
Morrie SheldonAshley from [facility] called and said the patient has an appointment for evaluation on 05/27/17 at 8am and was just calling to let us know.  Routing to AB for [employer]

## 2017-05-17 ENCOUNTER — Ambulatory Visit (HOSPITAL_COMMUNITY)
Admission: RE | Admit: 2017-05-17 | Discharge: 2017-05-17 | Disposition: A | Discharge status: Home / Self Care | Payer: Medicaid - Out of State | Source: Ambulatory Visit | Attending: Gastroenterology | Admitting: Gastroenterology

## 2017-05-17 ENCOUNTER — Encounter (HOSPITAL_COMMUNITY)
Admission: RE | Admit: 2017-05-17 | Discharge: 2017-05-17 | Disposition: A | Discharge status: Home / Self Care | Payer: Medicaid - Out of State | Source: Ambulatory Visit | Attending: Internal Medicine | Admitting: Internal Medicine

## 2017-05-17 ENCOUNTER — Other Ambulatory Visit: Payer: Self-pay | Admitting: Gastroenterology

## 2017-05-17 ENCOUNTER — Encounter (HOSPITAL_COMMUNITY): Payer: Self-pay

## 2017-05-17 DIAGNOSIS — D696 Thrombocytopenia, unspecified: Secondary | ICD-10-CM | POA: Diagnosis not present

## 2017-05-17 DIAGNOSIS — K269 Duodenal ulcer, unspecified as acute or chronic, without hemorrhage or perforation: Secondary | ICD-10-CM | POA: Diagnosis not present

## 2017-05-17 DIAGNOSIS — K210 Gastro-esophageal reflux disease with esophagitis: Secondary | ICD-10-CM | POA: Insufficient documentation

## 2017-05-17 DIAGNOSIS — K7011 Alcoholic hepatitis with ascites: Secondary | ICD-10-CM | POA: Insufficient documentation

## 2017-05-17 DIAGNOSIS — R188 Other ascites: Secondary | ICD-10-CM | POA: Diagnosis not present

## 2017-05-17 DIAGNOSIS — F5104 Psychophysiologic insomnia: Secondary | ICD-10-CM | POA: Diagnosis not present

## 2017-05-17 DIAGNOSIS — F1010 Alcohol abuse, uncomplicated: Secondary | ICD-10-CM | POA: Diagnosis not present

## 2017-05-17 DIAGNOSIS — D689 Coagulation defect, unspecified: Secondary | ICD-10-CM | POA: Diagnosis not present

## 2017-05-17 DIAGNOSIS — K7460 Unspecified cirrhosis of liver: Secondary | ICD-10-CM | POA: Diagnosis not present

## 2017-05-17 DIAGNOSIS — R109 Unspecified abdominal pain: Secondary | ICD-10-CM | POA: Diagnosis not present

## 2017-05-17 DIAGNOSIS — Z01812 Encounter for preprocedural laboratory examination: Secondary | ICD-10-CM | POA: Diagnosis present

## 2017-05-17 LAB — RAPID URINE DRUG SCREEN, HOSP PERFORMED
Amphetamines: NOT DETECTED
BARBITURATES: NOT DETECTED
Benzodiazepines: NOT DETECTED
Cocaine: NOT DETECTED
OPIATES: NOT DETECTED
TETRAHYDROCANNABINOL: NOT DETECTED

## 2017-05-17 NOTE — Progress Notes (Signed)
Paracentesis complete no signs of distress, 5.4 L yellow colored ascites removed.

## 2017-05-17 NOTE — Patient Instructions (Signed)
Joseph Cortez  05/17/2017     @PREFPERIOPPHARMACY @   Your procedure is scheduled on 05/24/2017.  Report to [facility] at 7:00 A.M.  Call this number if you have problems the morning of surgery:  [phone]   Remember:  Do not eat food or drink liquids after midnight.  Take these medicines the morning of surgery with A SIP OF WATER : Neurontin, Zofran and Protonix   Do not wear jewelry, make-up or nail polish.  Do not wear lotions, powders, or perfumes, or deoderant.  Do not shave 48 hours prior to surgery.  Men may shave face and neck.  Do not bring valuables to the hospital.  [facility] is not responsible for any belongings or valuables.  Contacts, dentures or bridgework may not be worn into surgery.  Leave your suitcase in the car.  After surgery it may be brought to your room.  For patients admitted to the hospital, discharge time will be determined by your treatment team.  Patients discharged the day of surgery will not be allowed to drive home.   Name and phone number of your driver:   family Special instructions:  n/a  Please read over the following fact sheets that you were given. Care and Recovery After Surgery    Colonoscopy, Adult A colonoscopy is an exam to look at the entire large intestine. During the exam, a lubricated, bendable tube is inserted into the anus and then passed into the rectum, colon, and other parts of the large intestine. A colonoscopy is often done as a part of normal colorectal screening or in response to certain symptoms, such as anemia, persistent diarrhea, abdominal pain, and blood in the stool. The exam can help screen for and diagnose medical problems, including:  Tumors.  Polyps.  Inflammation.  Areas of bleeding.  Tell a health care provider about:  Any allergies you have.  All medicines you are taking, including vitamins, herbs, eye drops, creams, and over-the-counter medicines.  Any problems you or family members have  had with anesthetic medicines.  Any blood disorders you have.  Any surgeries you have had.  Any medical conditions you have.  Any problems you have had passing stool. What are the risks? Generally, this is a safe procedure. However, problems may occur, including:  Bleeding.  A tear in the intestine.  A reaction to medicines given during the exam.  Infection (rare).  What happens before the procedure? Eating and drinking restrictions Follow instructions from your health care provider about eating and drinking, which may include:  A few days before the procedure - follow a low-fiber diet. Avoid nuts, seeds, dried fruit, raw fruits, and vegetables.  1-3 days before the procedure - follow a clear liquid diet. Drink only clear liquids, such as clear broth or bouillon, black coffee or tea, clear juice, clear soft drinks or sports drinks, gelatin dessert, and popsicles. Avoid any liquids that contain red or purple dye.  On the day of the procedure - do not eat or drink anything during the 2 hours before the procedure, or within the time period that your health care provider recommends.  Bowel prep If you were prescribed an oral bowel prep to clean out your colon:  Take it as told by your health care provider. Starting the day before your procedure, you will need to drink a large amount of medicated liquid. The liquid will cause you to have multiple loose stools until your stool is almost clear or light green.  If your skin or anus gets irritated from diarrhea, you may use these to relieve the irritation: ? Medicated wipes, such as adult wet wipes with aloe and vitamin E. ? A skin soothing-product like petroleum jelly.  If you vomit while drinking the bowel prep, take a break for up to 60 minutes and then begin the bowel prep again. If vomiting continues and you cannot take the bowel prep without vomiting, call your health care provider.  General instructions  Ask your health care  provider about changing or stopping your regular medicines. This is especially important if you are taking diabetes medicines or blood thinners.  Plan to have someone take you home from the hospital or clinic. What happens during the procedure?  An IV tube may be inserted into one of your veins.  You will be given medicine to help you relax (sedative).  To reduce your risk of infection: ? Your health care team will wash or sanitize their hands. ? Your anal area will be washed with soap.  You will be asked to lie on your side with your knees bent.  Your health care provider will lubricate a long, thin, flexible tube. The tube will have a camera and a light on the end.  The tube will be inserted into your anus.  The tube will be gently eased through your rectum and colon.  Air will be delivered into your colon to keep it open. You may feel some pressure or cramping.  The camera will be used to take images during the procedure.  A small tissue sample may be removed from your body to be examined under a microscope (biopsy). If any potential problems are found, the tissue will be sent to a lab for testing.  If small polyps are found, your health care provider may remove them and have them checked for cancer cells.  The tube that was inserted into your anus will be slowly removed. The procedure may vary among health care providers and hospitals. What happens after the procedure?  Your blood pressure, heart rate, breathing rate, and blood oxygen level will be monitored until the medicines you were given have worn off.  Do not drive for 24 hours after the exam.  You may have a small amount of blood in your stool.  You may pass gas and have mild abdominal cramping or bloating due to the air that was used to inflate your colon during the exam.  It is up to you to get the results of your procedure. Ask your health care provider, or the department performing the procedure, when your  results will be ready. This information is not intended to replace advice given to you by your health care provider. Make sure you discuss any questions you have with your health care provider. Document Released: 09/11/2000 Document Revised: 07/15/2016 Document Reviewed: 11/26/2015 Elsevier Interactive Patient Education  2018 [employer].

## 2017-05-19 ENCOUNTER — Inpatient Hospital Stay (HOSPITAL_COMMUNITY): Admission: RE | Admit: 2017-05-19 | Payer: Medicaid - Out of State | Source: Ambulatory Visit

## 2017-05-19 HISTORY — DX: Dyspnea, unspecified: R06.00

## 2017-05-24 ENCOUNTER — Ambulatory Visit (HOSPITAL_COMMUNITY): Payer: Self-pay | Admitting: Anesthesiology

## 2017-05-24 ENCOUNTER — Encounter (HOSPITAL_COMMUNITY): Payer: Self-pay | Admitting: *Deleted

## 2017-05-24 ENCOUNTER — Other Ambulatory Visit: Payer: Self-pay | Admitting: Gastroenterology

## 2017-05-24 ENCOUNTER — Ambulatory Visit (HOSPITAL_COMMUNITY)
Admission: RE | Admit: 2017-05-24 | Discharge: 2017-05-24 | Disposition: A | Discharge status: Home / Self Care | Payer: Medicaid - Out of State | Source: Ambulatory Visit | Attending: Internal Medicine | Admitting: Internal Medicine

## 2017-05-24 ENCOUNTER — Encounter (HOSPITAL_COMMUNITY)
Admission: RE | Disposition: A | Discharge status: Home / Self Care | Payer: Self-pay | Source: Ambulatory Visit | Attending: Internal Medicine

## 2017-05-24 DIAGNOSIS — K219 Gastro-esophageal reflux disease without esophagitis: Secondary | ICD-10-CM | POA: Insufficient documentation

## 2017-05-24 DIAGNOSIS — K6389 Other specified diseases of intestine: Secondary | ICD-10-CM | POA: Insufficient documentation

## 2017-05-24 DIAGNOSIS — K640 First degree hemorrhoids: Secondary | ICD-10-CM | POA: Insufficient documentation

## 2017-05-24 DIAGNOSIS — G629 Polyneuropathy, unspecified: Secondary | ICD-10-CM | POA: Insufficient documentation

## 2017-05-24 DIAGNOSIS — J449 Chronic obstructive pulmonary disease, unspecified: Secondary | ICD-10-CM | POA: Insufficient documentation

## 2017-05-24 DIAGNOSIS — K625 Hemorrhage of anus and rectum: Secondary | ICD-10-CM

## 2017-05-24 DIAGNOSIS — K5730 Diverticulosis of large intestine without perforation or abscess without bleeding: Secondary | ICD-10-CM | POA: Diagnosis not present

## 2017-05-24 DIAGNOSIS — K921 Melena: Secondary | ICD-10-CM | POA: Insufficient documentation

## 2017-05-24 DIAGNOSIS — Z79899 Other long term (current) drug therapy: Secondary | ICD-10-CM | POA: Insufficient documentation

## 2017-05-24 DIAGNOSIS — R188 Other ascites: Secondary | ICD-10-CM

## 2017-05-24 DIAGNOSIS — F17210 Nicotine dependence, cigarettes, uncomplicated: Secondary | ICD-10-CM | POA: Insufficient documentation

## 2017-05-24 DIAGNOSIS — K7460 Unspecified cirrhosis of liver: Secondary | ICD-10-CM | POA: Insufficient documentation

## 2017-05-24 HISTORY — PX: COLONOSCOPY WITH PROPOFOL: SHX5780

## 2017-05-24 SURGERY — COLONOSCOPY WITH PROPOFOL
Anesthesia: Monitor Anesthesia Care

## 2017-05-24 MED ORDER — PROPOFOL 10 MG/ML IV BOLUS
INTRAVENOUS | Status: AC
  Filled 2017-05-24: qty 20

## 2017-05-24 MED ORDER — ONDANSETRON 4 MG PO TBDP
ORAL_TABLET | ORAL | Status: AC
  Filled 2017-05-24: qty 1

## 2017-05-24 MED ORDER — CHLORHEXIDINE GLUCONATE CLOTH 2 % EX PADS: 6.0000 | MEDICATED_PAD | Freq: Once | CUTANEOUS | Status: DC

## 2017-05-24 MED ORDER — MIDAZOLAM HCL 2 MG/2ML IJ SOLN
INTRAMUSCULAR | Status: AC
  Filled 2017-05-24: qty 2

## 2017-05-24 MED ORDER — GLYCOPYRROLATE 0.2 MG/ML IJ SOLN
INTRAMUSCULAR | Status: AC
  Filled 2017-05-24: qty 1

## 2017-05-24 MED ORDER — MIDAZOLAM HCL 2 MG/2ML IJ SOLN
1.0000 mg | Freq: Once | INTRAMUSCULAR | Status: AC | PRN
  Administered 2017-05-24: 2 mg via INTRAVENOUS

## 2017-05-24 MED ORDER — PROPOFOL 500 MG/50ML IV EMUL
INTRAVENOUS | Status: DC | PRN
  Administered 2017-05-24: 150 ug/kg/min via INTRAVENOUS
  Administered 2017-05-24: 08:00:00 via INTRAVENOUS

## 2017-05-24 MED ORDER — ONDANSETRON 4 MG PO TBDP
4.0000 mg | ORAL_TABLET | Freq: Once | ORAL | Status: AC
  Administered 2017-05-24: 4 mg via ORAL

## 2017-05-24 MED ORDER — LIDOCAINE HCL (PF) 1 % IJ SOLN
INTRAMUSCULAR | Status: AC
  Filled 2017-05-24: qty 5

## 2017-05-24 MED ORDER — MIDAZOLAM HCL 2 MG/2ML IJ SOLN
INTRAMUSCULAR | Status: AC
Start: 2017-05-24 — End: ?
  Filled 2017-05-24: qty 2

## 2017-05-24 MED ORDER — LACTATED RINGERS IV SOLN
INTRAVENOUS | Status: DC
  Administered 2017-05-24: 08:00:00 via INTRAVENOUS

## 2017-05-24 NOTE — Op Note (Signed)
[facility] Patient Name: Joseph Cortez Procedure Date: 05/24/2017 8:04 AM MRN: [id number] Date of Birth: [birth year] Attending MD: Gennette Pac , MD CSN: [id number] Age: 51 Admit Type: Outpatient Procedure:                Colonoscopy Indications:              Hematochezia Providers:                Gennette Pac, MD, Criselda Peaches. Patsy Lager, RN,                            Dyann Ruddle Referring MD:             Lily Peer Medicines:                Propofol per Anesthesia Complications:            No immediate complications. Estimated Blood Loss:     Estimated blood loss was minimal. Procedure:                Pre-Anesthesia Assessment:                           - Prior to the procedure, a History and Physical                            was performed, and patient medications and                            allergies were reviewed. The patient's tolerance of                            previous anesthesia was also reviewed. The risks                            and benefits of the procedure and the sedation                            options and risks were discussed with the patient.                            All questions were answered, and informed consent                            was obtained. Prior Anticoagulants: The patient has                            taken no previous anticoagulant or antiplatelet                            agents. ASA Grade Assessment: III - A patient with                            severe systemic disease. After reviewing the risks  and benefits, the patient was deemed in                            satisfactory condition to undergo the procedure.                           After obtaining informed consent, the colonoscope                            was passed under direct vision. Throughout the                            procedure, the patient's blood pressure, pulse, and                            oxygen saturations  were monitored continuously. The                            [id number]) scope was introduced through the                            and advanced to the the cecum, identified by                            appendiceal orifice and ileocecal valve. The                            colonoscopy was somewhat difficult due to a                            tortuous colon. The ileocecal valve, appendiceal                            orifice, and rectum were photographed. The quality                            of the bowel preparation was good. The quality of                            the bowel preparation was adequate. Scope In: 8:24:25 AM Scope Out: 8:51:24 AM Scope Withdrawal Time: 0 hours 8 minutes 59 seconds  Total Procedure Duration: 0 hours 26 minutes 59 seconds  Findings:      The perianal and digital rectal examinations were normal.      Scattered small-mouthed diverticula were found in the entire colon.       Diffusely somewhat congested colon.      Internal hemorrhoids were found during retroflexion. The hemorrhoids       were moderate, medium-sized and Grade I (internal hemorrhoids that do       not prolapse).      The exam was otherwise without abnormality on direct and retroflexion       views. Impression:               - Diverticulosis in the entire examined colon.  Congested colon.                           - Internal hemorrhoids.                           - The examination was otherwise normal on direct                            and retroflexion views.                           - No specimens collected. Moderate Sedation:      Moderate (conscious) sedation was personally administered by an       anesthesia professional. The following parameters were monitored: oxygen       saturation, heart rate, blood pressure, respiratory rate, EKG, adequacy       of pulmonary ventilation, and response to care. Total physician       intraservice time was 33  minutes. Recommendation:           - Patient has a contact number available for                            emergencies. The signs and symptoms of potential                            delayed complications were discussed with the                            patient. Return to normal activities tomorrow.                            Written discharge instructions were provided to the                            patient.                           - Resume previous diet.                           - Continue present medications.                           - Repeat colonoscopy in 10 years for screening                            purposes.                           - Return to GI clinic (date not yet determined). Procedure Code(s):        --- Professional ---                           [phone], Colonoscopy, flexible; diagnostic, including  collection of specimen(s) by brushing or washing,                            when performed (separate procedure) Diagnosis Code(s):        --- Professional ---                           K64.0, First degree hemorrhoids                           K92.1, Melena (includes Hematochezia)                           K57.30, Diverticulosis of large intestine without                            perforation or abscess without bleeding CPT copyright 2016 American Medical Association. All rights reserved. The codes documented in this report are preliminary and upon coder review may  be revised to meet current compliance requirements. Gerrit Friends. Rourk, MD Gennette Pac, MD 05/24/2017 9:02:24 AM This report has been signed electronically. Number of Addenda: 0

## 2017-05-24 NOTE — H&P (View-Only) (Signed)
    Referring Provider: Nelson, Yvonne Sue, MD Primary Care Physician:  Nelson, Yvonne Sue, MD Primary GI: Dr. Rourk    Chief Complaint  Patient presents with  . Cirrhosis  . Abdominal Pain    HPI:   Joseph Cortez is a 51 y.o. male presenting today with a history of ETOH cirrhosis, chronic need for LVAPs, history of variceal bleeding in early 2017 at an outside hospital, intolerant to non-selective beta-blocker therapy, and umbilical and right inguinal hernia. 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 Due for surveillance in Dec 2019.   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 Goes to AA meetings every morning, which is new from last visit. Has abdominal pain lower abdomen and groin due to known hernias, chronic. No confusion or mental status changes. His need for paracenteses has decreased some with increased compliance with diuretic therapy and cessation of ETOH. Discussion of TIPS had been raised due to weekly paracenteses, but his MELD was 17 recently. After discussion with IR, if he were to continue with alcohol cessation and MELD were to improve, could discuss with him risks and benefits of TIPS in setting of frequent paracenteses. At this point, he has had less frequent visit for this and doing much better than prior visits.   He notes that he had blood in stool recently. This is not his first incidence. We had discussed colonoscopy previously but had to postpone due to medical issues and weakness. He appears to be improved from prior visits and has more energy. Currently taking Lasix 40 mg and Aldactone 100 mg daily. He will be seen at [facility] in September by both General Surgery and Transplant. He has Medicaid of [place], which has previously said they would not cover transplant services. His sister, Joseph Cortez, is present and in process of finding out more information.   Past Medical History:    Diagnosis Date  . AKI (acute kidney injury) (HCC) 07/14/2016  . Anemia   . Anxiety   . Arthritis   . Blood transfusion without reported diagnosis   . Cirrhosis (HCC)   . COPD (chronic obstructive pulmonary disease) (HCC)   . Depression   . Hypokalemia 07/14/2016  . Hyponatremia 07/14/2016  . Neuromuscular disorder (HCC)    Neuropathy  . Neuropathy   . Substance abuse   . Ulcer    BLEEDING VARICES, ULCERATIONS    Past Surgical History:  Procedure Laterality Date  . BIOPSY  06/27/2015   Procedure: BIOPSY (Gastric);  Surgeon: Robert M Rourk, MD;  Location: [facility];  Service: Endoscopy;;  . CHOLECYSTECTOMY    . ESOPHAGEAL BANDING N/A 01/30/2016   Procedure: ESOPHAGEAL BANDING;  Surgeon: Robert M Rourk, MD;  Location: [facility];  Service: Endoscopy;  Laterality: N/A;  . ESOPHAGOGASTRODUODENOSCOPY  January 06, 2016   Dr. Koch at [facility]: duodenitis in bullb, portal gastropathy, 2 columns of large distal esophageal varices, one with flat red spot, s/p band ligation. Needs 4 week surveillance EGD   . ESOPHAGOGASTRODUODENOSCOPY (EGD) WITH PROPOFOL N/A 06/27/2015   RMR:mild erosive reflux esophagitis, portal gastropathy, duodenal bulbar ulcer, negative H.pylori. Surveillance due yearly according to ASGE current guidelines regarding ETOH cirrhosis and ongoing ETOH abuse  . ESOPHAGOGASTRODUODENOSCOPY (EGD) WITH PROPOFOL N/A 01/30/2016   Dr. Rourk: Grade 2 varices s/p banding with complete deflation of varices, portal gastropathy, surveillance in May 2018  . 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 cherry red spots overlying one of the columns distally. portal gastropathy. EGD in 1.5 years     Current Outpatient Prescriptions  Medication Sig Dispense Refill  . bismuth subsalicylate (PEPTO BISMOL) 262 MG/15ML suspension Take 30 mLs by mouth every 6 (six) hours as needed (for diarrhea/loose stools or upset  stomach.).     Marland Kitchen. furosemide (LASIX) 20 MG tablet Take 2 tablets (40 mg total) by mouth daily. 60 tablet 3  . gabapentin (NEURONTIN) 300 MG capsule Take 300-600 mg by mouth See admin instructions. 300mg  in the morning, 300mg  at 4 pm, and 600mg  at bedtime    . lactulose (CHRONULAC) 10 GM/15ML solution TAKE 45 ML BY MOUTH 2 to 3 times daily 4000 mL 3  . Multiple Vitamin (MULTIVITAMIN WITH MINERALS) TABS tablet Take 1 tablet by mouth daily.    . ondansetron (ZOFRAN) 4 MG tablet Take 1 tablet (4 mg total) by mouth every 8 (eight) hours as needed for nausea or vomiting. 30 tablet 5  . pantoprazole (PROTONIX) 40 MG tablet Take 1 tablet (40 mg total) by mouth 2 (two) times daily before a meal. (Patient taking differently: Take 40 mg by mouth See admin instructions. TAKE 1 TABLET (40 MG) BY MOUTH ONCE DAILY (SCHEDULED); THEN TAKE AN ADDITIONAL DOSE IF NEEDED) 180 tablet 3  . spironolactone (ALDACTONE) 50 MG tablet Take 2 tablets (100 mg total) by mouth daily. 60 tablet 3   No current facility-administered medications for this visit.     Allergies as of 04/28/2017 - Review Complete 04/28/2017  Allergen Reaction Noted  . Sertraline Other (See Comments) 01/07/2016    Family History  Problem Relation Age of Onset  . Colon cancer Maternal Uncle   . Alcohol abuse Maternal Uncle   . Arthritis Mother   . Cancer Mother        breast cancer  . Hypertension Father   . Hyperlipidemia Father   . Lupus Sister   . Alcohol abuse Paternal Uncle   . Alzheimer's disease Maternal Grandmother   . COPD Maternal Grandfather   . Diabetes Maternal Grandfather   . Heart disease Paternal Grandfather   . Alcohol abuse Cousin     Social History   Social History  . Marital status: Divorced    Spouse name: N/A  . Number of children: 2  . Years of education: 11   Occupational History  . disability     former truck driver   Social History Main Topics  . Smoking status: Current Every Day Smoker    Types:  Cigarettes  . Smokeless tobacco: Never Used     Comment: 3-4 cigarettes daily   . Alcohol use 16.8 oz/week    28 Glasses of wine per week     Comment: last alcohol   . Drug use: No     Comment: cocaine historically, several years ago  . Sexual activity: Yes    Birth control/ protection: None   Other Topics Concern  . None   Social History Narrative   Lives alone   Lives near sister   No reg exercise   Disabled for 3 years    Review of Systems: Gen: Denies fever, chills, anorexia. Denies fatigue, weakness, weight loss.  CV: Denies chest pain, palpitations, syncope, peripheral edema, and claudication. Resp: Denies dyspnea at rest, cough, wheezing, coughing up blood, and pleurisy. GI: see HPI  Derm: Denies rash, itching, dry skin Psych: Denies depression, anxiety, memory  loss, confusion. No homicidal or suicidal ideation.  Heme: see HPI   Physical Exam: BP 117/76   Pulse 83   Temp (!) 97.4 F (36.3 C) (Oral)   Ht 5\' 8"  (1.727 m)   Wt 200 lb 3.2 oz (90.8 kg)   BMI 30.44 kg/m  General:   Alert and oriented. No distress noted. Pleasant and cooperative.  Head:  Normocephalic and atraumatic. Eyes:  Conjuctiva clear without scleral icterus. Mouth:  Oral mucosa pink and moist. Good dentition. No lesions. Heart:  S1, S2 present without murmurs Abdomen:  +BS, soft, moderate ascites but non-tense, protruding umbilical hernia.   Msk:  Symmetrical without gross deformities. Normal posture. Extremities:  Without edema. Neurologic:  Alert and  oriented x4;  grossly normal neurologically. Psych:  Alert and cooperative. Normal mood and affect.  Lab Results  Component Value Date   ALT 38 04/07/2017   AST 43 (H) 04/07/2017   ALKPHOS 98 04/07/2017   BILITOT 1.7 (H) 04/07/2017   Lab Results  Component Value Date   CREATININE 0.80 04/07/2017   BUN 10 04/07/2017   NA 133 (L) 04/07/2017   K 4.2 04/07/2017   CL 99 (L) 04/07/2017   CO2 25 04/07/2017   Lab Results  Component  Value Date   WBC 8.2 03/10/2017   HGB 14.8 03/10/2017   HCT 43.2 03/10/2017   MCV 91.1 03/10/2017   PLT 140 (L) 03/10/2017

## 2017-05-24 NOTE — Anesthesia Preprocedure Evaluation (Addendum)
Anesthesia Evaluation  Patient identified by MRN, date of birth, ID band Patient awake    Airway Mallampati: I       Dental  (+) Poor Dentition   Pulmonary COPD,  COPD inhaler, Current Smoker,    Pulmonary exam normal        Cardiovascular Normal cardiovascular exam Rhythm:Regular Rate:Normal     Neuro/Psych    GI/Hepatic PUD, GERD  Medicated and Controlled,(+) Hepatitis - (recent tests nonreactive)Varices, hx of axcites   Endo/Other  negative endocrine ROS  Renal/GU      Musculoskeletal   Abdominal Normal abdominal exam  (+)   Peds  Hematology   Anesthesia Other Findings   Reproductive/Obstetrics                            Anesthesia Physical Anesthesia Plan  ASA: III  Anesthesia Plan: MAC   Post-op Pain Management:    Induction: Intravenous  PONV Risk Score and Plan:   Airway Management Planned: Mask and Natural Airway  Additional Equipment:   Intra-op Plan:   Post-operative Plan: Extubation in OR  Informed Consent: I have reviewed the patients History and Physical, chart, labs and discussed the procedure including the risks, benefits and alternatives for the proposed anesthesia with the patient or authorized representative who has indicated his/her understanding and acceptance.   Dental advisory given  Plan Discussed with: CRNA  Anesthesia Plan Comments:         Anesthesia Quick Evaluation

## 2017-05-24 NOTE — Interval H&P Note (Signed)
History and Physical Interval Note:  05/24/2017 7:30 AM  Joseph Cortez  has presented today for surgery, with the diagnosis of rectal bleeding  The various methods of treatment have been discussed with the patient and family. After consideration of risks, benefits and other options for treatment, the patient has consented to  Procedure(s) with comments: COLONOSCOPY WITH PROPOFOL (N/A) - 8:30am as a surgical intervention .  The patient's history has been reviewed, patient examined, no change in status, stable for surgery.  I have reviewed the patient's chart and labs.  Questions were answered to the patient's satisfaction.     Joseph Cortez  Patient unchanged. Here for diagnostic colonoscopy were planned.   The risks, benefits, limitations, alternatives and imponderables have been reviewed with the patient. Questions have been answered. All parties are agreeable.

## 2017-05-24 NOTE — Discharge Instructions (Addendum)
Colonoscopy Discharge Instructions  Read the instructions outlined below and refer to this sheet in the next few weeks. These discharge instructions provide you with general information on caring for yourself after you leave the hospital. Your doctor may also give you specific instructions. While your treatment has been planned according to the most current medical practices available, unavoidable complications occasionally occur. If you have any problems or questions after discharge, call Dr. Jena Gauss at [phone]. ACTIVITY  You may resume your regular activity, but move at a slower pace for the next 24 hours.   Take frequent rest periods for the next 24 hours.   Walking will help get rid of the air and reduce the bloated feeling in your belly (abdomen).   No driving for 24 hours (because of the medicine (anesthesia) used during the test).    Do not sign any important legal documents or operate any machinery for 24 hours (because of the anesthesia used during the test).  NUTRITION  Drink plenty of fluids.   You may resume your normal diet as instructed by your doctor.   Begin with a light meal and progress to your normal diet. Heavy or fried foods are harder to digest and may make you feel sick to your stomach (nauseated).   Avoid alcoholic beverages for 24 hours or as instructed.  MEDICATIONS  You may resume your normal medications unless your doctor tells you otherwise.  WHAT YOU CAN EXPECT TODAY  Some feelings of bloating in the abdomen.   Passage of more gas than usual.   Spotting of blood in your stool or on the toilet paper.  IF YOU HAD POLYPS REMOVED DURING THE COLONOSCOPY:  No aspirin products for 7 days or as instructed.   No alcohol for 7 days or as instructed.   Eat a soft diet for the next 24 hours.  FINDING OUT THE RESULTS OF YOUR TEST Not all test results are available during your visit. If your test results are not back during the visit, make an appointment  with your caregiver to find out the results. Do not assume everything is normal if you have not heard from your caregiver or the medical facility. It is important for you to follow up on all of your test results.  SEEK IMMEDIATE MEDICAL ATTENTION IF:  You have more than a spotting of blood in your stool.   Your belly is swollen (abdominal distention).   You are nauseated or vomiting.   You have a temperature over 101.   You have abdominal pain or discomfort that is severe or gets worse throughout the day.    Hemorrhoid and diverticulosis information provided  Anusol HC cream applied to the anorectum 4 times a day  Benefiber 1 tablespoon twice daily  Repeat colonoscopy for screening purposes in 10 years.   PATIENT INSTRUCTIONS POST-ANESTHESIA  IMMEDIATELY FOLLOWING SURGERY:  Do not drive or operate machinery for the first twenty four hours after surgery.  Do not make any important decisions for twenty four hours after surgery or while taking narcotic pain medications or sedatives.  If you develop intractable nausea and vomiting or a severe headache please notify your doctor immediately.  FOLLOW-UP:  Please make an appointment with your surgeon as instructed. You do not need to follow up with anesthesia unless specifically instructed to do so.  WOUND CARE INSTRUCTIONS (if applicable):  Keep a dry clean dressing on the anesthesia/puncture wound site if there is drainage.  Once the wound has quit draining you  may leave it open to air.  Generally you should leave the bandage intact for twenty four hours unless there is drainage.  If the epidural site drains for more than 36-48 hours please call the anesthesia department.  QUESTIONS?:  Please feel free to call your physician or the hospital operator if you have any questions, and they will be happy to assist you.       Hemorrhoids Hemorrhoids are swollen veins in and around the rectum or anus. There are two types of  hemorrhoids:  Internal hemorrhoids. These occur in the veins that are just inside the rectum. They may poke through to the outside and become irritated and painful.  External hemorrhoids. These occur in the veins that are outside of the anus and can be felt as a painful swelling or hard lump near the anus.  Most hemorrhoids do not cause serious problems, and they can be managed with home treatments such as diet and lifestyle changes. If home treatments do not help your symptoms, procedures can be done to shrink or remove the hemorrhoids. What are the causes? This condition is caused by increased pressure in the anal area. This pressure may result from various things, including:  Constipation.  Straining to have a bowel movement.  Diarrhea.  Pregnancy.  Obesity.  Sitting for long periods of time.  Heavy lifting or other activity that causes you to strain.  Anal sex.  What are the signs or symptoms? Symptoms of this condition include:  Pain.  Anal itching or irritation.  Rectal bleeding.  Leakage of stool (feces).  Anal swelling.  One or more lumps around the anus.  How is this diagnosed? This condition can often be diagnosed through a visual exam. Other exams or tests may also be done, such as:  Examination of the rectal area with a gloved hand (digital rectal exam).  Examination of the anal canal using a small tube (anoscope).  A blood test, if you have lost a significant amount of blood.  A test to look inside the colon (sigmoidoscopy or colonoscopy).  How is this treated? This condition can usually be treated at home. However, various procedures may be done if dietary changes, lifestyle changes, and other home treatments do not help your symptoms. These procedures can help make the hemorrhoids smaller or remove them completely. Some of these procedures involve surgery, and others do not. Common procedures include:  Rubber band ligation. Rubber bands are placed  at the base of the hemorrhoids to cut off the blood supply to them.  Sclerotherapy. Medicine is injected into the hemorrhoids to shrink them.  Infrared coagulation. A type of light energy is used to get rid of the hemorrhoids.  Hemorrhoidectomy surgery. The hemorrhoids are surgically removed, and the veins that supply them are tied off.  Stapled hemorrhoidopexy surgery. A circular stapling device is used to remove the hemorrhoids and use staples to cut off the blood supply to them.  Follow these instructions at home: Eating and drinking  Eat foods that have a lot of fiber in them, such as whole grains, beans, nuts, fruits, and vegetables. Ask your health care provider about taking products that have added fiber (fiber supplements).  Drink enough fluid to keep your urine clear or pale yellow. Managing pain and swelling  Take warm sitz baths for 20 minutes, 3-4 times a day to ease pain and discomfort.  If directed, apply ice to the affected area. Using ice packs between sitz baths may be helpful. ? Put  ice in a plastic bag. ? Place a towel between your skin and the bag. ? Leave the ice on for 20 minutes, 2-3 times a day. General instructions  Take over-the-counter and prescription medicines only as told by your health care provider.  Use medicated creams or suppositories as told.  Exercise regularly.  Go to the bathroom when you have the urge to have a bowel movement. Do not wait.  Avoid straining to have bowel movements.  Keep the anal area dry and clean. Use wet toilet paper or moist towelettes after a bowel movement.  Do not sit on the toilet for long periods of time. This increases blood pooling and pain. Contact a health care provider if:  You have increasing pain and swelling that are not controlled by treatment or medicine.  You have uncontrolled bleeding.  You have difficulty having a bowel movement, or you are unable to have a bowel movement.  You have pain or  inflammation outside the area of the hemorrhoids. This information is not intended to replace advice given to you by your health care provider. Make sure you discuss any questions you have with your health care provider. Document Released: 09/11/2000 Document Revised: 02/12/2016 Document Reviewed: 05/29/2015 Elsevier Interactive Patient Education  2017 [employer]    Diverticulosis Diverticulosis is a condition that develops when small pouches (diverticula) form in the wall of the large intestine (colon). The colon is where water is absorbed and stool is formed. The pouches form when the inside layer of the colon pushes through weak spots in the outer layers of the colon. You may have a few pouches or many of them. What are the causes? The cause of this condition is not known. What increases the risk? The following factors may make you more likely to develop this condition:  Being older than age 23. Your risk for this condition increases with age. Diverticulosis is rare among people younger than age 21. By age 59, many people have it.  Eating a low-fiber diet.  Having frequent constipation.  Being overweight.  Not getting enough exercise.  Smoking.  Taking over-the-counter pain medicines, like aspirin and ibuprofen.  Having a family history of diverticulosis.  What are the signs or symptoms? In most people, there are no symptoms of this condition. If you do have symptoms, they may include:  Bloating.  Cramps in the abdomen.  Constipation or diarrhea.  Pain in the lower left side of the abdomen.  How is this diagnosed? This condition is most often diagnosed during an exam for other colon problems. Because diverticulosis usually has no symptoms, it often cannot be diagnosed independently. This condition may be diagnosed by:  Using a flexible scope to examine the colon (colonoscopy).  Taking an X-ray of the colon after dye has been put into the colon (barium  enema).  Doing a CT scan.  How is this treated? You may not need treatment for this condition if you have never developed an infection related to diverticulosis. If you have had an infection before, treatment may include:  Eating a high-fiber diet. This may include eating more fruits, vegetables, and grains.  Taking a fiber supplement.  Taking a live bacteria supplement (probiotic).  Taking medicine to relax your colon.  Taking antibiotic medicines.  Follow these instructions at home:  Drink 6-8 glasses of water or more each day to prevent constipation.  Try not to strain when you have a bowel movement.  If you have had an infection before: ?  Eat more fiber as directed by your health care provider or your diet and nutrition specialist (dietitian). ? Take a fiber supplement or probiotic, if your health care provider approves.  Take over-the-counter and prescription medicines only as told by your health care provider.  If you were prescribed an antibiotic, take it as told by your health care provider. Do not stop taking the antibiotic even if you start to feel better.  Keep all follow-up visits as told by your health care provider. This is important. Contact a health care provider if:  You have pain in your abdomen.  You have bloating.  You have cramps.  You have not had a bowel movement in 3 days. Get help right away if:  Your pain gets worse.  Your bloating becomes very bad.  You have a fever or chills, and your symptoms suddenly get worse.  You vomit.  You have bowel movements that are bloody or black.  You have bleeding from your rectum. Summary  Diverticulosis is a condition that develops when small pouches (diverticula) form in the wall of the large intestine (colon).  You may have a few pouches or many of them.  This condition is most often diagnosed during an exam for other colon problems.  If you have had an infection related to diverticulosis,  treatment may include increasing the fiber in your diet, taking supplements, or taking medicines. This information is not intended to replace advice given to you by your health care provider. Make sure you discuss any questions you have with your health care provider. Document Released: 06/11/2004 Document Revised: 08/03/2016 Document Reviewed: 08/03/2016 Elsevier Interactive Patient Education  2017 [employer].

## 2017-05-24 NOTE — Transfer of Care (Signed)
Immediate Anesthesia Transfer of Care Note  Patient: Joseph Cortez  Procedure(s) Performed: Procedure(s) with comments: COLONOSCOPY WITH PROPOFOL (N/A) - 8:30am  Patient Location: PACU  Anesthesia Type:MAC  Level of Consciousness: awake, oriented and patient cooperative  Airway & Oxygen Therapy: Patient Spontanous Breathing and Patient connected to face mask oxygen  Post-op Assessment: Report given to RN and Post -op Vital signs reviewed and stable  Post vital signs: Reviewed and stable  Last Vitals:  Vitals:   05/24/17 0805 05/24/17 0810  BP:    Pulse:    Resp: 17 (!) 36  Temp:    SpO2: 99% 98%    Last Pain:  Vitals:   05/24/17 0728  TempSrc: Oral  PainSc: 5       Patients Stated Pain Goal: 5 (70/76/15 1834)  Complications: No apparent anesthesia complications

## 2017-05-24 NOTE — Anesthesia Postprocedure Evaluation (Signed)
Anesthesia Post Note  Patient: Joseph Cortez  Procedure(s) Performed: Procedure(s) (LRB): COLONOSCOPY WITH PROPOFOL (N/A)  Patient location during evaluation: PACU Anesthesia Type: MAC Level of consciousness: awake and alert, oriented and patient cooperative Pain management: pain level controlled Vital Signs Assessment: post-procedure vital signs reviewed and stable Respiratory status: spontaneous breathing and patient connected to face mask oxygen Cardiovascular status: stable Postop Assessment: no signs of nausea or vomiting Anesthetic complications: no     Last Vitals:  Vitals:   05/24/17 0810 05/24/17 0856  BP:  115/71  Pulse:  (!) 116  Resp: (!) 36 18  Temp:  36.6 C  SpO2: 98% (!) 87%    Last Pain:  Vitals:   05/24/17 0728  TempSrc: Oral  PainSc: 5                  Magic Mohler A

## 2017-05-25 ENCOUNTER — Ambulatory Visit (HOSPITAL_COMMUNITY)
Admission: RE | Admit: 2017-05-25 | Discharge: 2017-05-25 | Disposition: A | Discharge status: Home / Self Care | Payer: Medicaid - Out of State | Source: Ambulatory Visit | Attending: Gastroenterology | Admitting: Gastroenterology

## 2017-05-25 ENCOUNTER — Ambulatory Visit (HOSPITAL_COMMUNITY): Admission: RE | Admit: 2017-05-25 | Payer: Medicaid - Out of State | Source: Ambulatory Visit

## 2017-05-25 ENCOUNTER — Encounter: Payer: Self-pay | Admitting: General Surgery

## 2017-05-25 ENCOUNTER — Ambulatory Visit (INDEPENDENT_AMBULATORY_CARE_PROVIDER_SITE_OTHER): Payer: Medicaid - Out of State | Admitting: General Surgery

## 2017-05-25 VITALS — BP 114/84 | HR 87 | Temp 98.4°F | Resp 18 | Ht 68.0 in | Wt 215.0 lb

## 2017-05-25 DIAGNOSIS — K7031 Alcoholic cirrhosis of liver with ascites: Secondary | ICD-10-CM | POA: Diagnosis not present

## 2017-05-25 DIAGNOSIS — K429 Umbilical hernia without obstruction or gangrene: Secondary | ICD-10-CM | POA: Diagnosis not present

## 2017-05-25 DIAGNOSIS — R188 Other ascites: Secondary | ICD-10-CM

## 2017-05-25 NOTE — Procedures (Signed)
PreOperative Dx: Alcoholic cirrhosis, ascites Postoperative Dx: Alcoholic cirrhosis, ascites Procedure:   US guided paracentesis Radiologist:  Tyron Russell Anesthesia:  10 ml of1% lidocaine Specimen:  6.5 L of yellow ascitic fluid EBL:   < 1 ml Complications: None

## 2017-05-25 NOTE — Progress Notes (Signed)
Paracentesis complete no signs of distress.  

## 2017-05-25 NOTE — Progress Notes (Signed)
Subjective:     Joseph Cortez  Comes back for examination of his umbilical hernia.  Since I last saw him, he has had paracentesis almost weekly.  He has been referred to liver transplant service at [facility] for further evaluation and treatment.  Seeing them in two days.  States he has intermittent umbilical hernia.  No fevers.  No drainage from umbilicus. Objective:    BP 114/84   Pulse 87   Temp 98.4 F (36.9 C)   Resp 18   Ht 5\' 8"  (1.727 m)   Wt 215 lb (97.5 kg)   BMI 32.69 kg/m   General:  alert, cooperative and no distress  Abdomen rotund, tense with ascites, reducible umblical hernia with thin skin present.  No erythema noted.     Assessment:    Uncontrolled cirrhotic, umbilical hernia    Plan:  I again told him that I could not fix his umbilical hernia due to his uncontrolled cirrhosis.  Now that he is referred to a transplant service, I told him to discuss the situation with them.  He understands.  Follow up here prn.

## 2017-05-27 ENCOUNTER — Encounter (HOSPITAL_COMMUNITY): Payer: Self-pay | Admitting: Internal Medicine

## 2017-06-01 ENCOUNTER — Ambulatory Visit (HOSPITAL_COMMUNITY)
Admission: RE | Admit: 2017-06-01 | Discharge: 2017-06-01 | Disposition: A | Discharge status: Home / Self Care | Payer: Medicaid - Out of State | Source: Ambulatory Visit | Attending: Gastroenterology | Admitting: Gastroenterology

## 2017-06-01 ENCOUNTER — Encounter (HOSPITAL_COMMUNITY): Payer: Self-pay

## 2017-06-01 ENCOUNTER — Other Ambulatory Visit: Payer: Self-pay | Admitting: Gastroenterology

## 2017-06-01 DIAGNOSIS — R188 Other ascites: Secondary | ICD-10-CM | POA: Insufficient documentation

## 2017-06-01 NOTE — Procedures (Signed)
PreOperative Dx: Alcoholic cirrhosis, ascites Postoperative Dx: Alcoholic cirrhosis, ascites Procedure:   US guided paracentesis Radiologist:  Tyron RussellBoles Anesthesia:  10 ml of1% lidocaine Specimen:  5.9 L of yellow ascitic fluid EBL:   < 1 ml Complications: None

## 2017-06-01 NOTE — Progress Notes (Signed)
Paracentesis complete no signs of distress. 5.9L ascites removed.

## 2017-06-02 ENCOUNTER — Telehealth: Payer: Self-pay | Admitting: Internal Medicine

## 2017-06-02 MED ORDER — ONDANSETRON HCL 4 MG PO TABS: 4.0000 mg | ORAL_TABLET | Freq: Three times a day (TID) | ORAL | 5 refills | Status: AC | PRN

## 2017-06-02 MED ORDER — PROMETHAZINE HCL 12.5 MG PO TABS: 12.5000 mg | ORAL_TABLET | Freq: Four times a day (QID) | ORAL | 0 refills | Status: DC | PRN

## 2017-06-02 NOTE — Telephone Encounter (Signed)
Routing to AB.  

## 2017-06-02 NOTE — Telephone Encounter (Signed)
Pt called requesting a refill for something for nausea. I told him to have his pharmacy fax us a refill request. Then he said he wants to be referred somewhere for pain management. Please call him at [phone]

## 2017-06-02 NOTE — Addendum Note (Signed)
Addended by: Gelene MinkBOONE, Takeia Ciaravino W on: 06/02/2017 04:49 PM   Modules accepted: Orders

## 2017-06-02 NOTE — Telephone Encounter (Signed)
pts sister [name] is aware.

## 2017-06-02 NOTE — Telephone Encounter (Signed)
I sent in Zofran again and also added low-dose Phenergan. Have him be careful with this due to drowsiness.   [facility] clinical pool: please refer him to pain management.

## 2017-06-03 ENCOUNTER — Other Ambulatory Visit: Payer: Self-pay

## 2017-06-03 DIAGNOSIS — R109 Unspecified abdominal pain: Secondary | ICD-10-CM

## 2017-06-03 NOTE — Telephone Encounter (Signed)
Called [facility] in [place]. They have pain management and accept [place]. Referral info faxed to [phone]. Tried to call pt to inform him, no answer, LMOVM.

## 2017-06-14 NOTE — Telephone Encounter (Signed)
Called [facility] to f/u on pain management referral. Receptionist advised that no appt has been scheduled yet. They have a lengthy process and it has to be put up for review.

## 2017-06-15 ENCOUNTER — Other Ambulatory Visit (HOSPITAL_COMMUNITY)
Admission: RE | Admit: 2017-06-15 | Discharge: 2017-06-15 | Disposition: A | Discharge status: Home / Self Care | Payer: Medicaid - Out of State | Source: Ambulatory Visit | Attending: Gastroenterology | Admitting: Gastroenterology

## 2017-06-15 DIAGNOSIS — K7011 Alcoholic hepatitis with ascites: Secondary | ICD-10-CM | POA: Diagnosis present

## 2017-06-15 LAB — BASIC METABOLIC PANEL
Anion gap: 12 (ref 5–15)
BUN: 17 mg/dL (ref 6–20)
CHLORIDE: 100 mmol/L — AB (ref 101–111)
CO2: 21 mmol/L — ABNORMAL LOW (ref 22–32)
CREATININE: 0.96 mg/dL (ref 0.61–1.24)
Calcium: 9.4 mg/dL (ref 8.9–10.3)
GFR calc Af Amer: >60 mL/min (ref >60)
GFR calc non Af Amer: >60 mL/min (ref >60)
GLUCOSE: 144 mg/dL — AB (ref 65–99)
Potassium: 4.2 mmol/L (ref 3.5–5.1)
SODIUM: 133 mmol/L — AB (ref 135–145)

## 2017-06-22 NOTE — Progress Notes (Signed)
Noted. No further recommendations.  

## 2017-07-06 ENCOUNTER — Telehealth: Payer: Self-pay | Admitting: Gastroenterology

## 2017-07-06 ENCOUNTER — Encounter (HOSPITAL_COMMUNITY)
Admission: EM | Disposition: A | Discharge status: Home / Self Care | Payer: Self-pay | Source: Home / Self Care | Attending: Internal Medicine

## 2017-07-06 ENCOUNTER — Emergency Department (HOSPITAL_COMMUNITY): Payer: Medicaid Other

## 2017-07-06 ENCOUNTER — Inpatient Hospital Stay (HOSPITAL_COMMUNITY): Payer: Medicaid Other | Admitting: Anesthesiology

## 2017-07-06 ENCOUNTER — Inpatient Hospital Stay (HOSPITAL_COMMUNITY): Payer: Medicaid Other

## 2017-07-06 ENCOUNTER — Encounter (HOSPITAL_COMMUNITY): Payer: Self-pay | Admitting: Emergency Medicine

## 2017-07-06 ENCOUNTER — Inpatient Hospital Stay (HOSPITAL_COMMUNITY)
Admission: EM | Admit: 2017-07-06 | Discharge: 2017-07-11 | DRG: 353 | Disposition: A | Discharge status: Home / Self Care | Payer: Medicaid Other | Attending: Internal Medicine | Admitting: Internal Medicine

## 2017-07-06 DIAGNOSIS — G9340 Encephalopathy, unspecified: Secondary | ICD-10-CM | POA: Diagnosis not present

## 2017-07-06 DIAGNOSIS — E872 Acidosis, unspecified: Secondary | ICD-10-CM

## 2017-07-06 DIAGNOSIS — K7031 Alcoholic cirrhosis of liver with ascites: Secondary | ICD-10-CM

## 2017-07-06 DIAGNOSIS — F10121 Alcohol abuse with intoxication delirium: Secondary | ICD-10-CM | POA: Diagnosis not present

## 2017-07-06 DIAGNOSIS — K766 Portal hypertension: Secondary | ICD-10-CM | POA: Diagnosis present

## 2017-07-06 DIAGNOSIS — T50905A Adverse effect of unspecified drugs, medicaments and biological substances, initial encounter: Secondary | ICD-10-CM | POA: Diagnosis not present

## 2017-07-06 DIAGNOSIS — K7030 Alcoholic cirrhosis of liver without ascites: Secondary | ICD-10-CM | POA: Diagnosis present

## 2017-07-06 DIAGNOSIS — Y92230 Patient room in hospital as the place of occurrence of the external cause: Secondary | ICD-10-CM | POA: Diagnosis not present

## 2017-07-06 DIAGNOSIS — K219 Gastro-esophageal reflux disease without esophagitis: Secondary | ICD-10-CM | POA: Diagnosis present

## 2017-07-06 DIAGNOSIS — Z8261 Family history of arthritis: Secondary | ICD-10-CM

## 2017-07-06 DIAGNOSIS — Z720 Tobacco use: Secondary | ICD-10-CM | POA: Diagnosis not present

## 2017-07-06 DIAGNOSIS — F17210 Nicotine dependence, cigarettes, uncomplicated: Secondary | ICD-10-CM | POA: Diagnosis present

## 2017-07-06 DIAGNOSIS — Z8249 Family history of ischemic heart disease and other diseases of the circulatory system: Secondary | ICD-10-CM

## 2017-07-06 DIAGNOSIS — K4090 Unilateral inguinal hernia, without obstruction or gangrene, not specified as recurrent: Secondary | ICD-10-CM | POA: Diagnosis present

## 2017-07-06 DIAGNOSIS — G4700 Insomnia, unspecified: Secondary | ICD-10-CM | POA: Diagnosis not present

## 2017-07-06 DIAGNOSIS — E876 Hypokalemia: Secondary | ICD-10-CM | POA: Diagnosis present

## 2017-07-06 DIAGNOSIS — Z4659 Encounter for fitting and adjustment of other gastrointestinal appliance and device: Secondary | ICD-10-CM

## 2017-07-06 DIAGNOSIS — J449 Chronic obstructive pulmonary disease, unspecified: Secondary | ICD-10-CM | POA: Diagnosis present

## 2017-07-06 DIAGNOSIS — Z23 Encounter for immunization: Secondary | ICD-10-CM

## 2017-07-06 DIAGNOSIS — F329 Major depressive disorder, single episode, unspecified: Secondary | ICD-10-CM | POA: Diagnosis present

## 2017-07-06 DIAGNOSIS — R739 Hyperglycemia, unspecified: Secondary | ICD-10-CM | POA: Diagnosis present

## 2017-07-06 DIAGNOSIS — Z803 Family history of malignant neoplasm of breast: Secondary | ICD-10-CM

## 2017-07-06 DIAGNOSIS — D72829 Elevated white blood cell count, unspecified: Secondary | ICD-10-CM | POA: Diagnosis present

## 2017-07-06 DIAGNOSIS — Z832 Family history of diseases of the blood and blood-forming organs and certain disorders involving the immune mechanism: Secondary | ICD-10-CM

## 2017-07-06 DIAGNOSIS — K420 Umbilical hernia with obstruction, without gangrene: Secondary | ICD-10-CM | POA: Diagnosis not present

## 2017-07-06 DIAGNOSIS — G92 Toxic encephalopathy: Secondary | ICD-10-CM | POA: Diagnosis not present

## 2017-07-06 DIAGNOSIS — Z8711 Personal history of peptic ulcer disease: Secondary | ICD-10-CM | POA: Diagnosis not present

## 2017-07-06 DIAGNOSIS — F1010 Alcohol abuse, uncomplicated: Secondary | ICD-10-CM | POA: Diagnosis not present

## 2017-07-06 DIAGNOSIS — Z8349 Family history of other endocrine, nutritional and metabolic diseases: Secondary | ICD-10-CM

## 2017-07-06 HISTORY — PX: UMBILICAL HERNIA REPAIR: SHX196

## 2017-07-06 LAB — TYPE AND SCREEN
ABO/RH(D): O NEG
ANTIBODY SCREEN: NEGATIVE

## 2017-07-06 LAB — COMPREHENSIVE METABOLIC PANEL
ALBUMIN: 4.9 g/dL (ref 3.5–5.0)
ALT: 30 U/L (ref 17–63)
AST: 52 U/L — AB (ref 15–41)
Alkaline Phosphatase: 129 U/L — ABNORMAL HIGH (ref 38–126)
Anion gap: 18 — ABNORMAL HIGH (ref 5–15)
BILIRUBIN TOTAL: 2.3 mg/dL — AB (ref 0.3–1.2)
BUN: 14 mg/dL (ref 6–20)
CHLORIDE: 96 mmol/L — AB (ref 101–111)
CO2: 20 mmol/L — ABNORMAL LOW (ref 22–32)
Calcium: 9.9 mg/dL (ref 8.9–10.3)
Creatinine, Ser: 0.89 mg/dL (ref 0.61–1.24)
GFR calc Af Amer: >60 mL/min (ref >60)
GFR calc non Af Amer: >60 mL/min (ref >60)
GLUCOSE: 151 mg/dL — AB (ref 65–99)
POTASSIUM: 4.1 mmol/L (ref 3.5–5.1)
Sodium: 134 mmol/L — ABNORMAL LOW (ref 135–145)
TOTAL PROTEIN: 8.4 g/dL — AB (ref 6.5–8.1)

## 2017-07-06 LAB — CBC
HEMATOCRIT: 44.2 % (ref 39.0–52.0)
Hemoglobin: 15.6 g/dL (ref 13.0–17.0)
MCH: 31.4 pg (ref 26.0–34.0)
MCHC: 35.3 g/dL (ref 30.0–36.0)
MCV: 88.9 fL (ref 78.0–100.0)
PLATELETS: 171 10*3/uL (ref 150–400)
RBC: 4.97 MIL/uL (ref 4.22–5.81)
RDW: 13.7 % (ref 11.5–15.5)
WBC: 11.8 10*3/uL — AB (ref 4.0–10.5)

## 2017-07-06 LAB — APTT: APTT: 42 s — AB (ref 24–36)

## 2017-07-06 LAB — GLUCOSE, CAPILLARY
GLUCOSE-CAPILLARY: 104 mg/dL — AB (ref 65–99)
GLUCOSE-CAPILLARY: 123 mg/dL — AB (ref 65–99)

## 2017-07-06 LAB — I-STAT CG4 LACTIC ACID, ED: Lactic Acid, Venous: 3.57 mmol/L (ref 0.5–1.9)

## 2017-07-06 LAB — PROTIME-INR
INR: 1.51
PROTHROMBIN TIME: 18 s — AB (ref 11.4–15.2)

## 2017-07-06 LAB — LIPASE, BLOOD: Lipase: 45 U/L (ref 11–51)

## 2017-07-06 LAB — MRSA PCR SCREENING: MRSA by PCR: NEGATIVE

## 2017-07-06 SURGERY — REPAIR, HERNIA, UMBILICAL, ADULT
Anesthesia: General | Site: Abdomen

## 2017-07-06 MED ORDER — BUPIVACAINE-EPINEPHRINE (PF) 0.25% -1:200000 IJ SOLN
INTRAMUSCULAR | Status: AC
  Filled 2017-07-06: qty 30

## 2017-07-06 MED ORDER — LACTATED RINGERS IV SOLN
INTRAVENOUS | Status: DC
  Administered 2017-07-06: 20:00:00 via INTRAVENOUS

## 2017-07-06 MED ORDER — SUCCINYLCHOLINE CHLORIDE 20 MG/ML IJ SOLN
INTRAMUSCULAR | Status: DC | PRN
  Administered 2017-07-06: 120 mg via INTRAVENOUS

## 2017-07-06 MED ORDER — FENTANYL CITRATE (PF) 250 MCG/5ML IJ SOLN
INTRAMUSCULAR | Status: AC
  Filled 2017-07-06: qty 5

## 2017-07-06 MED ORDER — MORPHINE SULFATE (PF) 4 MG/ML IV SOLN
INTRAVENOUS | Status: AC
  Administered 2017-07-06: 4 mg via INTRAVENOUS
  Filled 2017-07-06: qty 1

## 2017-07-06 MED ORDER — SUGAMMADEX SODIUM 200 MG/2ML IV SOLN
INTRAVENOUS | Status: AC
  Filled 2017-07-06: qty 2

## 2017-07-06 MED ORDER — FENTANYL CITRATE (PF) 100 MCG/2ML IJ SOLN
INTRAMUSCULAR | Status: DC | PRN
  Administered 2017-07-06: 150 ug via INTRAVENOUS
  Administered 2017-07-06 (×3): 50 ug via INTRAVENOUS

## 2017-07-06 MED ORDER — SODIUM CHLORIDE 0.9 % IV SOLN
1.0000 g | Freq: Once | INTRAVENOUS | Status: AC
  Administered 2017-07-06: 1 g via INTRAVENOUS
  Filled 2017-07-06: qty 1

## 2017-07-06 MED ORDER — MEPERIDINE HCL 25 MG/ML IJ SOLN: 6.2500 mg | INTRAMUSCULAR | Status: DC | PRN

## 2017-07-06 MED ORDER — DEXAMETHASONE SODIUM PHOSPHATE 10 MG/ML IJ SOLN
INTRAMUSCULAR | Status: AC
  Filled 2017-07-06: qty 1

## 2017-07-06 MED ORDER — SODIUM CHLORIDE 0.9 % IV SOLN
INTRAVENOUS | Status: DC
  Administered 2017-07-06 – 2017-07-09 (×4): via INTRAVENOUS

## 2017-07-06 MED ORDER — MORPHINE SULFATE (PF) 4 MG/ML IV SOLN
4.0000 mg | Freq: Once | INTRAVENOUS | Status: AC
  Administered 2017-07-06: 4 mg via INTRAVENOUS

## 2017-07-06 MED ORDER — LIDOCAINE HCL (CARDIAC) 20 MG/ML IV SOLN
INTRAVENOUS | Status: DC | PRN
  Administered 2017-07-06: 100 mg via INTRAVENOUS

## 2017-07-06 MED ORDER — MORPHINE SULFATE (PF) 4 MG/ML IV SOLN
4.0000 mg | Freq: Once | INTRAVENOUS | Status: AC
  Administered 2017-07-06: 4 mg via INTRAVENOUS
  Filled 2017-07-06: qty 1

## 2017-07-06 MED ORDER — ALBUMIN HUMAN 5 % IV SOLN
INTRAVENOUS | Status: DC | PRN
  Administered 2017-07-06: 21:00:00 via INTRAVENOUS

## 2017-07-06 MED ORDER — MIDAZOLAM HCL 2 MG/2ML IJ SOLN
INTRAMUSCULAR | Status: AC
  Filled 2017-07-06: qty 2

## 2017-07-06 MED ORDER — IOPAMIDOL (ISOVUE-300) INJECTION 61%
100.0000 mL | Freq: Once | INTRAVENOUS | Status: AC | PRN
  Administered 2017-07-06: 100 mL via INTRAVENOUS

## 2017-07-06 MED ORDER — DEXAMETHASONE SODIUM PHOSPHATE 10 MG/ML IJ SOLN
INTRAMUSCULAR | Status: DC | PRN
  Administered 2017-07-06: 6 mg via INTRAVENOUS

## 2017-07-06 MED ORDER — PROMETHAZINE HCL 25 MG/ML IJ SOLN: 6.2500 mg | INTRAMUSCULAR | Status: DC | PRN

## 2017-07-06 MED ORDER — SODIUM CHLORIDE 0.9 % IV SOLN
1.0000 mg | Freq: Once | INTRAVENOUS | Status: AC
  Administered 2017-07-06: 1 mg via INTRAVENOUS
  Filled 2017-07-06: qty 0.2

## 2017-07-06 MED ORDER — ONDANSETRON HCL 4 MG/2ML IJ SOLN: 4.0000 mg | Freq: Three times a day (TID) | INTRAMUSCULAR | Status: AC | PRN

## 2017-07-06 MED ORDER — INFLUENZA VAC SPLIT QUAD 0.5 ML IM SUSY
0.5000 mL | PREFILLED_SYRINGE | INTRAMUSCULAR | Status: AC
  Administered 2017-07-07: 0.5 mL via INTRAMUSCULAR
  Filled 2017-07-06: qty 0.5

## 2017-07-06 MED ORDER — BUPIVACAINE-EPINEPHRINE 0.25% -1:200000 IJ SOLN
INTRAMUSCULAR | Status: DC | PRN
  Administered 2017-07-06: 30 mL

## 2017-07-06 MED ORDER — MUPIROCIN 2 % EX OINT
TOPICAL_OINTMENT | CUTANEOUS | Status: AC
  Administered 2017-07-06: 20:00:00
  Filled 2017-07-06: qty 22

## 2017-07-06 MED ORDER — THIAMINE HCL 100 MG/ML IJ SOLN
100.0000 mg | Freq: Every day | INTRAMUSCULAR | Status: DC
  Administered 2017-07-06 – 2017-07-10 (×5): 100 mg via INTRAVENOUS
  Filled 2017-07-06 (×3): qty 2
  Filled 2017-07-06: qty 1
  Filled 2017-07-06: qty 2

## 2017-07-06 MED ORDER — HYDROMORPHONE HCL 1 MG/ML IJ SOLN: 0.2500 mg | INTRAMUSCULAR | Status: DC | PRN

## 2017-07-06 MED ORDER — MORPHINE SULFATE (PF) 4 MG/ML IV SOLN
4.0000 mg | INTRAVENOUS | Status: DC | PRN
  Administered 2017-07-06 – 2017-07-09 (×18): 4 mg via INTRAVENOUS
  Filled 2017-07-06 (×18): qty 1

## 2017-07-06 MED ORDER — SUGAMMADEX SODIUM 200 MG/2ML IV SOLN
INTRAVENOUS | Status: DC | PRN
  Administered 2017-07-06: 200 mg via INTRAVENOUS

## 2017-07-06 MED ORDER — PNEUMOCOCCAL VAC POLYVALENT 25 MCG/0.5ML IJ INJ
0.5000 mL | INJECTION | INTRAMUSCULAR | Status: AC
  Administered 2017-07-07: 0.5 mL via INTRAMUSCULAR
  Filled 2017-07-06: qty 0.5

## 2017-07-06 MED ORDER — ROCURONIUM BROMIDE 100 MG/10ML IV SOLN
INTRAVENOUS | Status: DC | PRN
  Administered 2017-07-06: 40 mg via INTRAVENOUS

## 2017-07-06 MED ORDER — LIDOCAINE 2% (20 MG/ML) 5 ML SYRINGE
INTRAMUSCULAR | Status: AC
  Filled 2017-07-06: qty 5

## 2017-07-06 MED ORDER — ONDANSETRON HCL 4 MG/2ML IJ SOLN
INTRAMUSCULAR | Status: DC | PRN
  Administered 2017-07-06: 4 mg via INTRAVENOUS

## 2017-07-06 MED ORDER — MIDAZOLAM HCL 5 MG/5ML IJ SOLN
INTRAMUSCULAR | Status: DC | PRN
  Administered 2017-07-06: 2 mg via INTRAVENOUS

## 2017-07-06 MED ORDER — PROPOFOL 10 MG/ML IV BOLUS
INTRAVENOUS | Status: DC | PRN
  Administered 2017-07-06: 200 mg via INTRAVENOUS

## 2017-07-06 MED ORDER — ONDANSETRON HCL 4 MG/2ML IJ SOLN
4.0000 mg | Freq: Once | INTRAMUSCULAR | Status: AC
  Administered 2017-07-06: 4 mg via INTRAVENOUS
  Filled 2017-07-06: qty 2

## 2017-07-06 MED ORDER — 0.9 % SODIUM CHLORIDE (POUR BTL) OPTIME
TOPICAL | Status: DC | PRN
  Administered 2017-07-06: 1000 mL

## 2017-07-06 MED ORDER — FAMOTIDINE IN NACL 20-0.9 MG/50ML-% IV SOLN
20.0000 mg | Freq: Two times a day (BID) | INTRAVENOUS | Status: DC
  Administered 2017-07-07 – 2017-07-10 (×7): 20 mg via INTRAVENOUS
  Filled 2017-07-06 (×10): qty 50

## 2017-07-06 MED ORDER — PROPOFOL 10 MG/ML IV BOLUS
INTRAVENOUS | Status: AC
  Filled 2017-07-06: qty 20

## 2017-07-06 SURGICAL SUPPLY — 64 items
BLADE CLIPPER SURG (BLADE) IMPLANT
CANISTER SUCT 3000ML PPV (MISCELLANEOUS) ×4 IMPLANT
CHLORAPREP W/TINT 26ML (MISCELLANEOUS) ×4 IMPLANT
COVER MAYO STAND STRL (DRAPES) ×4 IMPLANT
COVER SURGICAL LIGHT HANDLE (MISCELLANEOUS) ×8 IMPLANT
DERMABOND ADHESIVE PROPEN (GAUZE/BANDAGES/DRESSINGS) ×2
DERMABOND ADVANCED (GAUZE/BANDAGES/DRESSINGS) ×2
DERMABOND ADVANCED .7 DNX12 (GAUZE/BANDAGES/DRESSINGS) ×2 IMPLANT
DERMABOND ADVANCED .7 DNX6 (GAUZE/BANDAGES/DRESSINGS) ×2 IMPLANT
DRAPE HALF SHEET 40X57 (DRAPES) ×4 IMPLANT
DRAPE LAPAROSCOPIC ABDOMINAL (DRAPES) ×4 IMPLANT
DRAPE LAPAROTOMY 100X72 PEDS (DRAPES) ×4 IMPLANT
DRAPE UTILITY XL STRL (DRAPES) ×4 IMPLANT
DRAPE WARM FLUID 44X44 (DRAPE) ×4 IMPLANT
DRSG COVADERM 4X6 (GAUZE/BANDAGES/DRESSINGS) ×4 IMPLANT
DRSG OPSITE POSTOP 4X10 (GAUZE/BANDAGES/DRESSINGS) IMPLANT
DRSG OPSITE POSTOP 4X8 (GAUZE/BANDAGES/DRESSINGS) IMPLANT
ELECT BLADE 6.5 EXT (BLADE) IMPLANT
ELECT CAUTERY BLADE 6.4 (BLADE) ×8 IMPLANT
ELECT REM PT RETURN 9FT ADLT (ELECTROSURGICAL) ×4
ELECTRODE REM PT RTRN 9FT ADLT (ELECTROSURGICAL) ×2 IMPLANT
GLOVE BIOGEL PI IND STRL 7.0 (GLOVE) ×2 IMPLANT
GLOVE BIOGEL PI IND STRL 7.5 (GLOVE) ×4 IMPLANT
GLOVE BIOGEL PI INDICATOR 7.0 (GLOVE) ×2
GLOVE BIOGEL PI INDICATOR 7.5 (GLOVE) ×4
GLOVE SURG SS PI 7.0 STRL IVOR (GLOVE) ×8 IMPLANT
GOWN STRL REUS W/ TWL LRG LVL3 (GOWN DISPOSABLE) ×12 IMPLANT
GOWN STRL REUS W/TWL LRG LVL3 (GOWN DISPOSABLE) ×12
KIT BASIN OR (CUSTOM PROCEDURE TRAY) ×4 IMPLANT
KIT ROOM TURNOVER OR (KITS) ×4 IMPLANT
LEGGING LITHOTOMY PAIR STRL (DRAPES) IMPLANT
LIGASURE IMPACT 36 18CM CVD LR (INSTRUMENTS) IMPLANT
MESH HERNIA 3X6 (Mesh General) ×4 IMPLANT
NEEDLE HYPO 25GX1X1/2 BEV (NEEDLE) ×4 IMPLANT
NS IRRIG 1000ML POUR BTL (IV SOLUTION) ×8 IMPLANT
PACK GENERAL/GYN (CUSTOM PROCEDURE TRAY) ×4 IMPLANT
PAD ARMBOARD 7.5X6 YLW CONV (MISCELLANEOUS) ×4 IMPLANT
PENCIL BUTTON HOLSTER BLD 10FT (ELECTRODE) ×4 IMPLANT
SPONGE LAP 18X18 X RAY DECT (DISPOSABLE) IMPLANT
STAPLER VISISTAT 35W (STAPLE) ×4 IMPLANT
SUCTION POOLE TIP (SUCTIONS) ×4 IMPLANT
SURGILUBE 2OZ TUBE FLIPTOP (MISCELLANEOUS) IMPLANT
SUT MNCRL AB 4-0 PS2 18 (SUTURE) ×8 IMPLANT
SUT PDS AB 0 CT 36 (SUTURE) ×4 IMPLANT
SUT PDS AB 1 TP1 96 (SUTURE) ×8 IMPLANT
SUT PROLENE 0 CT 1 30 (SUTURE) ×8 IMPLANT
SUT PROLENE 2 0 CT2 30 (SUTURE) IMPLANT
SUT PROLENE 2 0 KS (SUTURE) IMPLANT
SUT SILK 2 0 SH CR/8 (SUTURE) ×4 IMPLANT
SUT SILK 2 0 TIES 10X30 (SUTURE) ×4 IMPLANT
SUT SILK 3 0 SH CR/8 (SUTURE) ×8 IMPLANT
SUT SILK 3 0 TIES 10X30 (SUTURE) ×8 IMPLANT
SUT VIC AB 3-0 SH 18 (SUTURE) IMPLANT
SUT VIC AB 3-0 SH 27 (SUTURE) ×2
SUT VIC AB 3-0 SH 27XBRD (SUTURE) ×2 IMPLANT
SYR BULB IRRIGATION 50ML (SYRINGE) ×4 IMPLANT
SYR CONTROL 10ML LL (SYRINGE) ×4 IMPLANT
TOWEL OR 17X24 6PK STRL BLUE (TOWEL DISPOSABLE) ×4 IMPLANT
TOWEL OR 17X26 10 PK STRL BLUE (TOWEL DISPOSABLE) ×8 IMPLANT
TRAY FOLEY W/METER SILVER 16FR (SET/KITS/TRAYS/PACK) IMPLANT
TRAY PROCTOSCOPIC FIBER OPTIC (SET/KITS/TRAYS/PACK) IMPLANT
TUBE CONNECTING 12'X1/4 (SUCTIONS) ×1
TUBE CONNECTING 12X1/4 (SUCTIONS) ×3 IMPLANT
YANKAUER SUCT BULB TIP NO VENT (SUCTIONS) ×4 IMPLANT

## 2017-07-06 NOTE — Transfer of Care (Signed)
Immediate Anesthesia Transfer of Care Note  Patient: Joseph Cortez  Procedure(s) Performed: REDUCTION AND REPAIR OF STRANGULATED UMBILICAL HERNIA (N/A Abdomen)  Patient Location: PACU  Anesthesia Type:General  Level of Consciousness: awake, alert , oriented and patient cooperative  Airway & Oxygen Therapy: Patient Spontanous Breathing and Patient connected to nasal cannula oxygen  Post-op Assessment: Report given to RN, Post -op Vital signs reviewed and stable and Patient moving all extremities  Post vital signs: Reviewed and stable  Last Vitals:  Vitals:   07/06/17 1900 07/06/17 2148  BP: 125/81 (P) 124/81  Pulse: 62   Resp: 15   Temp: 36.9 C (!) (P) 36.3 C  SpO2: 98%     Last Pain:  Vitals:   07/06/17 1945  TempSrc:   PainSc: 7       Patients Stated Pain Goal: 1 (07/06/17 1945)  Complications: No apparent anesthesia complications

## 2017-07-06 NOTE — Consult Note (Signed)
Reason for Consult: strangulated hernia Referring Physician: Marguis Mathieson is an 51 y.o. male.  HPI: 51 yo male with long history of liver disease who has had a right inguinal and umbilical hernia known for 8 months. The umbilical hernia has not been bothering him until today when he started having constant pain at the area. He also has vomiting 5 x. His last bowel movement was this morning. He uses lactulose and notes it has been working less well this week. His last peritoneal tap was 5 weeks ago. He has had 3 EGDs for bleeding varices. He has not had alcohol for about 5 months.  When he had a cholecystectomy he was in the hospital for 7 days due to concern for infection or other complication.   Past Medical History:  Diagnosis Date  . AKI (acute kidney injury) ([place]) 07/14/2016  . Anemia   . Anxiety   . Arthritis   . Blood transfusion without reported diagnosis   . Cirrhosis ([place])   . COPD (chronic obstructive pulmonary disease) ([place])   . Depression   . Dyspnea   . Hypokalemia 07/14/2016  . Hyponatremia 07/14/2016  . Neuromuscular disorder (HCC)    Neuropathy  . Neuropathy   . Substance abuse ([place])   . Ulcer    BLEEDING VARICES, ULCERATIONS    Past Surgical History:  Procedure Laterality Date  . BIOPSY  06/27/2015   Procedure: BIOPSY (Gastric);  Surgeon: Daneil Dolin, MD;  Location: [facility] OR[facility];  Service: Endoscopy;;  . CHOLECYSTECTOMY    . COLONOSCOPY WITH PROPOFOL N/A 05/24/2017   Procedure: COLONOSCOPY WITH PROPOFOL;  Surgeon: Daneil Dolin, MD;  Location: [facility];  Service: Endoscopy;  Laterality: N/A;  8:30am  . ESOPHAGEAL BANDING N/A 01/30/2016   Procedure: ESOPHAGEAL BANDING;  Surgeon: Daneil Dolin, MD;  Location: [facility];  Service: Endoscopy;  Laterality: N/A;  . ESOPHAGOGASTRODUODENOSCOPY  January 06, 2016   [facility] Derrill [facility] at [facility]: duodenitis in bullb, portal gastropathy, 2 columns of large distal esophageal varices, one with flat red spot, s/p band  ligation. Needs 4 week surveillance EGD   . ESOPHAGOGASTRODUODENOSCOPY (EGD) WITH PROPOFOL N/A 06/27/2015   OAC:ZYSA erosive reflux esophagitis, portal gastropathy, duodenal bulbar ulcer, negative H.pylori. Surveillance due yearly according to ASGE current guidelines regarding ETOH cirrhosis and ongoing ETOH abuse  . ESOPHAGOGASTRODUODENOSCOPY (EGD) WITH PROPOFOL N/A 01/30/2016   Dr. Gala Romney: Grade 2 varices s/p banding with complete deflation of varices, portal gastropathy, surveillance in May 2018  . ESOPHAGOGASTRODUODENOSCOPY (EGD) WITH PROPOFOL N/A 03/15/2017   4 columns of Grade 2 varices s/p banding and completely eradicated. cherry red spots overlying one of the columns distally. portal gastropathy. EGD in 1.5 years     Family History  Problem Relation Age of Onset  . Colon cancer Maternal Uncle   . Alcohol abuse Maternal Uncle   . Arthritis Mother   . Cancer Mother        breast cancer  . Hypertension Father   . Hyperlipidemia Father   . Lupus Sister   . Alcohol abuse Paternal Uncle   . Alzheimer's disease Maternal Grandmother   . COPD Maternal Grandfather   . Diabetes Maternal Grandfather   . Heart disease Paternal Grandfather   . Alcohol abuse Cousin     Social History:  reports that he has been smoking Cigarettes.  He has never used smokeless tobacco. He reports that he drinks about 16.8 oz of alcohol per week . He reports that he uses  drugs, including Opium.  Allergies:  Allergies  Allergen Reactions  . Sertraline Other (See Comments)    Suicidal ideations    Medications: I have reviewed the patient's current medications.  Results for orders placed or performed during the hospital encounter of 07/06/17 (from the past 48 hour(s))  Lipase, blood     Status: None   Collection Time: 07/06/17  2:54 PM  Result Value Ref Range   Lipase 45 11 - 51 U/L  Comprehensive metabolic panel     Status: Abnormal   Collection Time: 07/06/17  2:54 PM  Result Value Ref Range   Sodium  134 (L) 135 - 145 mmol/L   Potassium 4.1 3.5 - 5.1 mmol/L   Chloride 96 (L) 101 - 111 mmol/L   CO2 20 (L) 22 - 32 mmol/L   Glucose, Bld 151 (H) 65 - 99 mg/dL   BUN 14 6 - 20 mg/dL   Creatinine, Ser 0.89 0.61 - 1.24 mg/dL   Calcium 9.9 8.9 - 10.3 mg/dL   Total Protein 8.4 (H) 6.5 - 8.1 g/dL   Albumin 4.9 3.5 - 5.0 g/dL   AST 52 (H) 15 - 41 U/L   ALT 30 17 - 63 U/L   Alkaline Phosphatase 129 (H) 38 - 126 U/L   Total Bilirubin 2.3 (H) 0.3 - 1.2 mg/dL   GFR calc non Af Amer >60 >60 mL/min   GFR calc Af Amer >60 >60 mL/min    Comment: (NOTE) The eGFR has been calculated using the CKD EPI equation. This calculation has not been validated in all clinical situations. eGFR's persistently <60 mL/min signify possible Chronic Kidney Disease.    Anion gap 18 (H) 5 - 15  CBC     Status: Abnormal   Collection Time: 07/06/17  2:54 PM  Result Value Ref Range   WBC 11.8 (H) 4.0 - 10.5 K/uL   RBC 4.97 4.22 - 5.81 MIL/uL   Hemoglobin 15.6 13.0 - 17.0 g/dL   HCT 44.2 39.0 - 52.0 %   MCV 88.9 78.0 - 100.0 fL   MCH 31.4 26.0 - 34.0 pg   MCHC 35.3 30.0 - 36.0 g/dL   RDW 13.7 11.5 - 15.5 %   Platelets 171 150 - 400 K/uL  I-Stat CG4 Lactic Acid, ED     Status: Abnormal   Collection Time: 07/06/17  4:15 PM  Result Value Ref Range   Lactic Acid, Venous 3.57 (HH) 0.5 - 1.9 mmol/L   Comment NOTIFIED PHYSICIAN     Ct Abdomen Pelvis W Contrast  Result Date: 07/06/2017 CLINICAL DATA:  Abdominal pain with hernia suspected. EXAM: CT ABDOMEN AND PELVIS WITH CONTRAST TECHNIQUE: Multidetector CT imaging of the abdomen and pelvis was performed using the standard protocol following bolus administration of intravenous contrast. CONTRAST:  130m ISOVUE-300 IOPAMIDOL (ISOVUE-300) INJECTION 61% COMPARISON:  11/20/2016 FINDINGS: Lower chest:  Small layering right pleural effusion. Hepatobiliary: Cirrhosis with portal hypertension. There is recannulized periumbilical vein and small volume ascites. No discrete mass  lesion is seen when allowing for heterogeneity of the parenchymal enhancement.Cholecystectomy. No common bile duct dilatation. Pancreas: Unremarkable. Spleen: Mild splenomegaly.  No focal abnormality. Adrenals/Urinary Tract: Negative adrenals. No hydronephrosis or stone. Unremarkable bladder. Stomach/Bowel: High-grade small bowel obstruction with transition point at a umbilical hernia which contains a knuckle of small bowel. Proximal jejunum is decompressed but a proximal transition point is not identified to suggest closed loop. The bowel wall is not thickened or nonenhancing. No perforation. No appendicitis. Vascular/Lymphatic: Portal hypertension with  recannulized umbilical vein. Atherosclerotic calcification of the aorta. No acute finding. No mass or adenopathy. Reproductive:No pathologic findings. Other: Umbilical hernia described above. There is also a large right indirect inguinal hernia containing loops of nonobstructed small bowel. Musculoskeletal: No acute or aggressive finding. IMPRESSION: 1. High-grade small bowel obstruction due to umbilical hernia containing a knuckle of small bowel. No bowel necrosis or perforation identified. 2. Large right inguinal hernia containing multiple loops of nonobstructed small bowel. 3. Cirrhosis with portal hypertension.  Ascites is small volume. Electronically Signed   By: Monte Fantasia M.D.   On: 07/06/2017 16:35    Review of Systems  Constitutional: Negative for chills and fever.  HENT: Negative for hearing loss.   Eyes: Negative for blurred vision and double vision.  Respiratory: Negative for cough and hemoptysis.   Cardiovascular: Negative for chest pain and palpitations.  Gastrointestinal: Positive for abdominal pain, nausea and vomiting.  Genitourinary: Negative for dysuria and urgency.  Musculoskeletal: Negative for myalgias and neck pain.  Skin: Negative for itching and rash.  Neurological: Negative for dizziness, tingling and headaches.   Endo/Heme/Allergies: Does not bruise/bleed easily.  Psychiatric/Behavioral: Negative for depression and suicidal ideas.   Blood pressure 133/87, pulse 69, temperature 98.4 F (36.9 C), temperature source Oral, resp. rate 20, height _0  (1.727 m), weight 87.1 kg (192 lb), SpO2 99 %. Physical Exam  Vitals reviewed. Constitutional: He is oriented to person, place, and time. He appears well-developed and well-nourished.  HENT:  Head: Normocephalic and atraumatic.  Eyes: Pupils are equal, round, and reactive to light. Conjunctivae and EOM are normal.  Neck: Normal range of motion. Neck supple.  Cardiovascular: Normal rate and regular rhythm.   Respiratory: Effort normal and breath sounds normal.  GI: Soft. Bowel sounds are normal. He exhibits no distension. There is tenderness in the periumbilical area. There is guarding.  Umbilical hernia present with purple skin changes  Musculoskeletal: Normal range of motion.  Neurological: He is alert and oriented to person, place, and time.  Skin: Skin is warm and dry.  Psychiatric: He has a normal mood and affect. His behavior is normal.    Assessment/Plan: 51 yo male with compensated cirrhosis related to alcohol abuse presents with incarcerated umbilical hernia with evidence of obstruction and possible gangrene. -IV abx -NG tube -OR for open umbilical hernia repair with possible bowel resection -we discussed the details of the operation, we discussed the indications to use mesh or not use mesh depending on contaminated or clean field as well as higher likelihood of recurrence with mesh. We discussed that his chronic medical problems make him a moderate high risk for this procedure with many additional possible complications including but not limited to pneumonia, decompensated liver failure, ascites leak, infection, kidney injury, prolonged hospitalization. All questions were answered. The patient agreed to proceed.  Arta Bruce  Maven Rosander 07/06/2017, 7:25 PM

## 2017-07-06 NOTE — Telephone Encounter (Signed)
Received notification from sister, Unknown Foley, that patient was having severe periumbilical abdominal pain with associated N/V. Vomiting relieves discomfort. Small amount of brown/pinkish tinged vomiting. Doubt variceal bleed but concern for hernia complications. I have asked her to take him to the ED for urgent assessment. I have notified triage in the ED.   Gelene Mink, PhD, ANP-BC [facility]

## 2017-07-06 NOTE — Consult Note (Signed)
[facility] Consult  Reason for Consult: Incarcerated strangulated umbilical hernia in cirrhotic  Referring Physician:  Dr. Sabra Heck  Chief Complaint    Abdominal Pain      Joseph Cortez is a 51 y.o. male.   HPI: Joseph Cortez is a 51 yo male with a history of alcoholic cirrhosis, Child B who is presenting with incarcerated umbilical hernia with skin changes and obstructive symptoms. The patient reports having the hernia chronically and that it is normally soft and able to be reduced. He was seen by Dr. Arnoldo Morale electively a few weeks ago and due to his cirrhosis it was felt that he was not a candidate to have this fixed at [facility]  The umbilical hernia has been stuck out for several hours and is firm to the touch. He has had bilious emesis multiple times and had a bowel movement today that caused significant pain.  The umbilicus had erythema initially and now has ecchymosis.   Past Medical History:  Diagnosis Date  . AKI (acute kidney injury) ([place]) 07/14/2016  . Anemia   . Anxiety   . Arthritis   . Blood transfusion without reported diagnosis   . Cirrhosis ([place])   . COPD (chronic obstructive pulmonary disease) ([place])   . Depression   . Dyspnea   . Hypokalemia 07/14/2016  . Hyponatremia 07/14/2016  . Neuromuscular disorder (HCC)    Neuropathy  . Neuropathy   . Substance abuse ([place])   . Ulcer    BLEEDING VARICES, ULCERATIONS    Past Surgical History:  Procedure Laterality Date  . BIOPSY  06/27/2015   Procedure: BIOPSY (Gastric);  Surgeon: Daneil Dolin, MD;  Location: [facility] ORS;  Service: Endoscopy;;  . CHOLECYSTECTOMY    . COLONOSCOPY WITH PROPOFOL N/A 05/24/2017   Procedure: COLONOSCOPY WITH PROPOFOL;  Surgeon: Daneil Dolin, MD;  [facility];  Service: Endoscopy;  Laterality: N/A;  8:30am  . ESOPHAGEAL BANDING N/A 01/30/2016   Procedure: ESOPHAGEAL BANDING;  Surgeon: Daneil Dolin, MD;  [facility];  Service: Endoscopy;  Laterality:  N/A;  . ESOPHAGOGASTRODUODENOSCOPY  January 06, 2016   [facility]ill [facility]: duodenitis in bullb, portal gastropathy, 2 columns of large distal esophageal varices, one with flat red spot, s/p band ligation. Needs 4 week surveillance EGD   . ESOPHAGOGASTRODUODENOSCOPY (EGD) WITH PROPOFOL N/A 06/27/2015   HUT:MLYY erosive reflux esophagitis, portal gastropathy, duodenal bulbar ulcer, negative H.pylori. Surveillance due yearly according to ASGE current guidelines regarding ETOH cirrhosis and ongoing ETOH abuse  . ESOPHAGOGASTRODUODENOSCOPY (EGD) WITH PROPOFOL N/A 01/30/2016   Dr. Gala Romney: Grade 2 varices s/p banding with complete deflation of varices, portal gastropathy, surveillance in May 2018  . ESOPHAGOGASTRODUODENOSCOPY (EGD) WITH PROPOFOL N/A 03/15/2017   4 columns of Grade 2 varices s/p banding and completely eradicated. cherry red spots overlying one of the columns distally. portal gastropathy. EGD in 1.5 years     Family History  Problem Relation Age of Onset  . Colon cancer Maternal Uncle   . Alcohol abuse Maternal Uncle   . Arthritis Mother   . Cancer Mother        breast cancer  . Hypertension Father   . Hyperlipidemia Father   . Lupus Sister   . Alcohol abuse Paternal Uncle   . Alzheimer's disease Maternal Grandmother   . COPD Maternal Grandfather   . Diabetes Maternal Grandfather   . Heart disease Paternal Grandfather   . Alcohol abuse Cousin     Social History  Substance Use Topics  . Smoking status: Current Every Day Smoker    Types: Cigarettes  . Smokeless tobacco: Never Used     Comment: 3-4 cigarettes daily   . Alcohol use 16.8 oz/week    28 Glasses of wine per week     Comment: last alcohol     Medications: I have reviewed the patient's current medications.  No current facility-administered medications on file prior to encounter.    Current Outpatient Prescriptions on File Prior to Encounter  Medication Sig Dispense Refill  . acetaminophen (TYLENOL) 650 MG CR  tablet Take 650 mg by mouth every 8 (eight) hours as needed for pain.    Marland Kitchen amitriptyline (ELAVIL) 25 MG tablet Take 1 tablet by mouth daily.  4  . B Complex-Biotin-FA (B COMPLETE PO) Take 1 tablet by mouth daily.    Marland Kitchen bismuth subsalicylate (PEPTO BISMOL) 262 MG/15ML suspension Take 30 mLs by mouth every 6 (six) hours as needed (for diarrhea/loose stools or upset stomach.).     Marland Kitchen furosemide (LASIX) 20 MG tablet Take 2 tablets (40 mg total) by mouth daily. 60 tablet 3  . gabapentin (NEURONTIN) 300 MG capsule Take 300-600 mg by mouth See admin instructions. 348m in the morning, 309mat 4 pm, and 60072mt bedtime    . lactulose (CHRONULAC) 10 GM/15ML solution TAKE 45 ML BY MOUTH 2 to 3 times daily 4000 mL 3  . Multiple Vitamin (MULTIVITAMIN WITH MINERALS) TABS tablet Take 1 tablet by mouth daily.    . ondansetron (ZOFRAN) 4 MG tablet Take 1 tablet (4 mg total) by mouth every 8 (eight) hours as needed for nausea or vomiting. 60 tablet 5  . pantoprazole (PROTONIX) 40 MG tablet Take 1 tablet (40 mg total) by mouth 2 (two) times daily before a meal. (Patient taking differently: Take 40 mg by mouth daily. ) 180 tablet 3  . promethazine (PHENERGAN) 12.5 MG tablet Take 1 tablet (12.5 mg total) by mouth every 6 (six) hours as needed for nausea or vomiting. 30 tablet 0  . Thiamine HCl (VITAMIN B-1 PO) Take 1 tablet by mouth daily.     Allergies  Allergen Reactions  . Sertraline Other (See Comments)    Suicidal ideations    ROS:  A comprehensive review of systems was negative except for: Gastrointestinal: positive for abdominal pain, nausea, vomiting and cirrhosis, umbilical hernia  Blood pressure 132/81, pulse 63, resp. rate 20, height _0  (1.727 m), weight 192 lb (87.1 kg), SpO2 98 %. Physical Exam  Constitutional: He is oriented to person, place, and time and well-developed, well-nourished, and in no distress.  HENT:  Head: Normocephalic and atraumatic.  Eyes: Pupils are equal, round, and  reactive to light.  Cardiovascular: Normal rate and regular rhythm.   Pulmonary/Chest: Effort normal and breath sounds normal.  Abdominal: Soft. He exhibits distension and ascites. There is tenderness. A hernia is present. Hernia confirmed positive in the umbilical area.  Umbilical hernia with ecchymosis, firm and nonreducible, tender  Musculoskeletal: Normal range of motion. He exhibits no edema.  Neurological: He is alert and oriented to person, place, and time. GCS score is 15.  Skin: Skin is warm and dry.  Psychiatric: Memory, affect and judgment normal.  Vitals reviewed.   Results: Results for orders placed or performed during the hospital encounter of 07/06/17 (from the past 48 hour(s))  Lipase, blood     Status: None   Collection Time: 07/06/17  2:54 PM  Result Value Ref Range  Lipase 45 11 - 51 U/L  Comprehensive metabolic panel     Status: Abnormal   Collection Time: 07/06/17  2:54 PM  Result Value Ref Range   Sodium 134 (L) 135 - 145 mmol/L   Potassium 4.1 3.5 - 5.1 mmol/L   Chloride 96 (L) 101 - 111 mmol/L   CO2 20 (L) 22 - 32 mmol/L   Glucose, Bld 151 (H) 65 - 99 mg/dL   BUN 14 6 - 20 mg/dL   Creatinine, Ser 0.89 0.61 - 1.24 mg/dL   Calcium 9.9 8.9 - 10.3 mg/dL   Total Protein 8.4 (H) 6.5 - 8.1 g/dL   Albumin 4.9 3.5 - 5.0 g/dL   AST 52 (H) 15 - 41 U/L   ALT 30 17 - 63 U/L   Alkaline Phosphatase 129 (H) 38 - 126 U/L   Total Bilirubin 2.3 (H) 0.3 - 1.2 mg/dL   GFR calc non Af Amer >60 >60 mL/min   GFR calc Af Amer >60 >60 mL/min    Comment: (NOTE) The eGFR has been calculated using the CKD EPI equation. This calculation has not been validated in all clinical situations. eGFR's persistently <60 mL/min signify possible Chronic Kidney Disease.    Anion gap 18 (H) 5 - 15  CBC     Status: Abnormal   Collection Time: 07/06/17  2:54 PM  Result Value Ref Range   WBC 11.8 (H) 4.0 - 10.5 K/uL   RBC 4.97 4.22 - 5.81 MIL/uL   Hemoglobin 15.6 13.0 - 17.0 g/dL   HCT  44.2 39.0 - 52.0 %   MCV 88.9 78.0 - 100.0 fL   MCH 31.4 26.0 - 34.0 pg   MCHC 35.3 30.0 - 36.0 g/dL   RDW 13.7 11.5 - 15.5 %   Platelets 171 150 - 400 K/uL  I-Stat CG4 Lactic Acid, ED     Status: Abnormal   Collection Time: 07/06/17  4:15 PM  Result Value Ref Range   Lactic Acid, Venous 3.57 (HH) 0.5 - 1.9 mmol/L   Comment NOTIFIED PHYSICIAN     CT a/p reviewed personally- Small bowel with fluid in hernia, thin layer of skin over hernia, fluid in mesentery   Assessment & Plan:  Joseph Cortez is a 51 y.o. male with incarcerated strangulated umbilical hernia with obstructive symptoms. Patient needs surgery based on his CT scan and his Lactic acid. He needs to go to a facility with ICU capabilities, more robust anesthesia capabilities, blood products, etc as he will need aggressive management and resuscitation related to his cirrhosis.    -Talked to Dr. Emogene Morgan at [facility], referred me to [facility], and spoke with them, at this time they are unable to accept the patient, called [facility] back and they will see the patient on transfer  -Surgery will see patient but wants medicine involved for his medical care  -Will get him transferred to [facility] ASAP   All questions were answered to the satisfaction of the patient and family.  Virl Cagey 07/06/2017, 4:40 PM

## 2017-07-06 NOTE — H&P (Signed)
PULMONARY / CRITICAL CARE MEDICINE   Name: Joseph Cortez MRN: [id number] DOB: Nov 18, 1965    ADMISSION DATE:  07/06/2017 CONSULTATION DATE:  07/06/2017  REFERRING MD:  Dr. Hyacinth Meeker  CHIEF COMPLAINT:  Incarcerated strangulated umbilical hernia   HISTORY OF PRESENT ILLNESS:   51 year old male with PMH of alcoholic cirrhosis (quit drinking 01/2017), ? Current smoker, reported COPD, periumbilical and right inguinal hernias who presented to [facility] ER with complaints of severe periumbilical abdominal pain, with firm distention, discoloration, tenderness, and associated nausea and vomiting with bilious emesis that started around 0800.  Vomiting relieves discomfort.   Patient has had reducible hernias for many years.  Recently seen by Dr. Lovell Sheehan at [facility] electively 04/2017 and due to his cirrhosis, he was not a candidate to have his periumbilical hernia repaired at [facility].  In ER, patient was found to have tender abdomen with hard, indurated, non reducible periumbilical hernia.  CT showing high-grade small bowel obstruction due to umbilical hernia containing small bowel.  Labs notable for AG 18, WBC 11.8, and lactic acid 3.57.  He has been afebrile and normotensive.  Empiric Invanz per surgery given.  Due to patients history of cirrhosis, patient to be transferred to [facility] for surgery by Dr. Derrell Lolling w/CCS and PCCM to admit as patient will require ICU post-operatively.   PAST MEDICAL HISTORY :  He  has a past medical history of AKI (acute kidney injury) (HCC) (07/14/2016); Anemia; Anxiety; Arthritis; Blood transfusion without reported diagnosis; Cirrhosis (HCC); COPD (chronic obstructive pulmonary disease) (HCC); Depression; Dyspnea; Hypokalemia (07/14/2016); Hyponatremia (07/14/2016); Neuromuscular disorder (HCC); Neuropathy; Substance abuse (HCC); and Ulcer.  PAST SURGICAL HISTORY: He  has a past surgical history that includes Cholecystectomy; Esophagogastroduodenoscopy (egd) with propofol (N/A,  06/27/2015); biopsy (06/27/2015); Esophagogastroduodenoscopy (January 06, 2016); Esophagogastroduodenoscopy (egd) with propofol (N/A, 01/30/2016); esophageal banding (N/A, 01/30/2016); Esophagogastroduodenoscopy (egd) with propofol (N/A, 03/15/2017); and Colonoscopy with propofol (N/A, 05/24/2017).  Allergies  Allergen Reactions  . Sertraline Other (See Comments)    Suicidal ideations    No current facility-administered medications on file prior to encounter.    Current Outpatient Prescriptions on File Prior to Encounter  Medication Sig  . acetaminophen (TYLENOL) 650 MG CR tablet Take 650 mg by mouth every 8 (eight) hours as needed for pain.  Marland Kitchen amitriptyline (ELAVIL) 25 MG tablet Take 1 tablet by mouth daily.  . B Complex-Biotin-FA (B COMPLETE PO) Take 1 tablet by mouth daily.  Marland Kitchen bismuth subsalicylate (PEPTO BISMOL) 262 MG/15ML suspension Take 30 mLs by mouth every 6 (six) hours as needed (for diarrhea/loose stools or upset stomach.).   Marland Kitchen furosemide (LASIX) 20 MG tablet Take 2 tablets (40 mg total) by mouth daily.  Marland Kitchen gabapentin (NEURONTIN) 300 MG capsule Take 300-600 mg by mouth See admin instructions.  in the morning,  at 4 pm, and  at bedtime  . lactulose (CHRONULAC) 10 GM/15ML solution TAKE 45 ML BY MOUTH 2 to 3 times daily  . Multiple Vitamin (MULTIVITAMIN WITH MINERALS) TABS tablet Take 1 tablet by mouth daily.  . ondansetron (ZOFRAN) 4 MG tablet Take 1 tablet (4 mg total) by mouth every 8 (eight) hours as needed for nausea or vomiting.  . pantoprazole (PROTONIX) 40 MG tablet Take 1 tablet (40 mg total) by mouth 2 (two) times daily before a meal. (Patient taking differently: Take 40 mg by mouth daily. )  . promethazine (PHENERGAN) 12.5 MG tablet Take 1 tablet (12.5 mg total) by mouth every 6 (six) hours as needed for nausea or  vomiting.  . Thiamine HCl (VITAMIN B-1 PO) Take 1 tablet by mouth daily.    FAMILY HISTORY:  His indicated that his mother is alive. He indicated that his  father is alive. He indicated that his sister is alive. He indicated that his brother is alive. He indicated that his maternal grandmother is deceased. He indicated that his maternal grandfather is deceased. He indicated that his paternal grandmother is deceased. He indicated that his paternal grandfather is deceased. He indicated that his maternal uncle is alive. He indicated that his paternal uncle is alive. He indicated that the status of his cousin is unknown.    SOCIAL HISTORY: He  reports that he has been smoking Cigarettes.  He has never used smokeless tobacco. He reports that he drinks about 16.8 oz of alcohol per week . He reports that he uses drugs, including Opium.  REVIEW OF SYSTEMS:     Bolds are positive  Constitutional: weight loss, gain, night sweats, Fevers, chills, fatigue .  HEENT: headaches, Sore throat, sneezing, nasal congestion, post nasal drip, Difficulty swallowing, Tooth/dental problems, visual complaints visual changes, ear ache CV:  chest pain, radiates:,Orthopnea, PND, swelling in lower extremities, dizziness, palpitations, syncope.  GI  heartburn, indigestion, abdominal pain, nausea, vomiting, diarrhea, change in bowel habits, loss of appetite, bloody stools.  Resp: cough, productive: , hemoptysis, dyspnea, chest pain, pleuritic.  Skin: rash or itching or icterus GU: dysuria, change in color of urine, urgency or frequency. flank pain, hematuria  MS: joint pain or swelling. decreased range of motion  Psych: change in mood or affect. depression or anxiety.  Neuro: difficulty with speech, weakness, numbness, ataxia    SUBJECTIVE:    VITAL SIGNS: BP 133/87   Pulse 69   Temp 98.2 F (36.8 C) (Oral)   Resp 20   Ht  (1.727 m)   Wt 192 lb (87.1 kg)   SpO2 99%   BMI 29.19 kg/m   HEMODYNAMICS:    VENTILATOR SETTINGS:    INTAKE / OUTPUT: No intake/output data recorded.  PHYSICAL EXAMINATION: General:  Middle aged adult male in NAD Neuro:  Alert  oriented, non-focal HEENT:  [place]/AT, PERRL, no JVD Cardiovascular:  RRR, no MRG Lungs:  Clear bilateral breath sounds Abdomen:  Soft, non-tender, non-distended. Non-reducible umbilical hernia with purple coloration Musculoskeletal: No acute deformity or ROM limitation Skin:  Grossly intact  LABS:  BMET  Recent Labs Lab 07/06/17 1454  NA 134*  K 4.1  CL 96*  CO2 20*  BUN 14  CREATININE 0.89  GLUCOSE 151*    Electrolytes  Recent Labs Lab 07/06/17 1454  CALCIUM 9.9    CBC  Recent Labs Lab 07/06/17 1454  WBC 11.8*  HGB 15.6  HCT 44.2  PLT 171    Coag's No results for input(s): APTT, INR in the last 168 hours.  Sepsis Markers  Recent Labs Lab 07/06/17 1615  LATICACIDVEN 3.57*    ABG No results for input(s): PHART, PCO2ART, PO2ART in the last 168 hours.  Liver Enzymes  Recent Labs Lab 07/06/17 1454  AST 52*  ALT 30  ALKPHOS 129*  BILITOT 2.3*  ALBUMIN 4.9    Cardiac Enzymes No results for input(s): TROPONINI, PROBNP in the last 168 hours.  Glucose No results for input(s): GLUCAP in the last 168 hours.  Imaging Ct Abdomen Pelvis W Contrast  Result Date: 07/06/2017 CLINICAL DATA:  Abdominal pain with hernia suspected. EXAM: CT ABDOMEN AND PELVIS WITH CONTRAST TECHNIQUE: Multidetector CT imaging of the abdomen  and pelvis was performed using the standard protocol following bolus administration of intravenous contrast. CONTRAST:  ISOVUE-300 IOPAMIDOL (ISOVUE-300) INJECTION 61% COMPARISON:  11/20/2016 FINDINGS: Lower chest:  Small layering right pleural effusion. Hepatobiliary: Cirrhosis with portal hypertension. There is recannulized periumbilical vein and small volume ascites. No discrete mass lesion is seen when allowing for heterogeneity of the parenchymal enhancement.Cholecystectomy. No common bile duct dilatation. Pancreas: Unremarkable. Spleen: Mild splenomegaly.  No focal abnormality. Adrenals/Urinary Tract: Negative adrenals. No  hydronephrosis or stone. Unremarkable bladder. Stomach/Bowel: High-grade small bowel obstruction with transition point at a umbilical hernia which contains a knuckle of small bowel. Proximal jejunum is decompressed but a proximal transition point is not identified to suggest closed loop. The bowel wall is not thickened or nonenhancing. No perforation. No appendicitis. Vascular/Lymphatic: Portal hypertension with recannulized umbilical vein. Atherosclerotic calcification of the aorta. No acute finding. No mass or adenopathy. Reproductive:No pathologic findings. Other: Umbilical hernia described above. There is also a large right indirect inguinal hernia containing loops of nonobstructed small bowel. Musculoskeletal: No acute or aggressive finding. IMPRESSION: 1. High-grade small bowel obstruction due to umbilical hernia containing a knuckle of small bowel. No bowel necrosis or perforation identified. 2. Large right inguinal hernia containing multiple loops of nonobstructed small bowel. 3. Cirrhosis with portal hypertension.  Ascites is small volume. Electronically Signed   By: Marnee Spring M.D.   On: 07/06/2017 16:35   STUDIES:  CT abd 10/9 >> small right pleural effusion; cirrhosis w/portal HTN, small volume ascites, high-grade SBO w/transition at umbilical hernia which contains a knuckle of small bowel; no bowel necrosis or perforation seen; large right inguinal hernia w/ multiple loops of nonobstructed bowel  CULTURES: [facility] 10/9 >>  ANTIBIOTICS: 10/9 Invanz in ED  SIGNIFICANT EVENTS: 10/9 transfer from [facility] to [facility]; OR  LINES/TUBES:   DISCUSSION: 81 yoM w/PMH of alcoholic cirrhosis (quit in May 2018)  and chronic previously reduicable hernias who presented with abd pain w/nausea and vomiting on 10/9 morning.  Found to have incarcerated strangulated umbilical hernia with lactate 3.57. Afebrile/ hemodynamically stable.  Transferred [facility] to [facility] for surgery due to hx of cirrhosis.   ASSESSMENT /  PLAN:  PULMONARY A: Reported hx COPD (no PFTs or CT chest, no home nebs) Tobacco abuse P:   Supplemental O2 as indicated No outpatient COPD medications  CARDIOVASCULAR A:  No acute issues  P:  Tele monitoring Trend lactate   RENAL A:   AGMA/ Lactic acidosis P:   IVF Trend BMP / mag/ phos/ urinary output Replace electrolytes as indicated  GASTROINTESTINAL A:  Incarcerated strangulated umbilical hernia s/p . Hx Alcoholic cirrhosis (sobriety in 01/2017), right inguinal hernia - CT abd noted as above P:   Coags and ammonia now Trend LFTs Hold pre-admit lasix, spironolactone, lactulose PPI for SUP Surgical management per [facility]  HEMATOLOGIC A:   Leukocytosis slight P:  Type and cross  INFECTIOUS A:   Surgical ppx  P:   Invanz in ED Hold off ABX for now  ENDOCRINE A:   Hyperglycemia  (last HgbA1C 4.8 on 12/2016) P:   Follow glucose on CBG Add ssi if two consecutive glucose measurements > 180  NEUROLOGIC A:   Acute encephalopathy- related to sedation  Hx depression, anxiety, neuropathy Hx hepatic encephalopthy P:   Hold pre-admit Elavil, Neurontin Continue thiamine IV daily  Holding lactulose while NPO   FAMILY  - Updates: family updated bedside  - Inter-disciplinary family meet or Palliative Care meeting due by:  10/16  Joneen Roach, AGACNP-BC [facility] Pulmonology/Critical Care Pager [phone] or [phone]  07/06/2017 7:42 PM

## 2017-07-06 NOTE — Anesthesia Procedure Notes (Signed)
Procedure Name: Intubation Date/Time: 07/06/2017 8:36 PM Performed by: Rosiland Oz Pre-anesthesia Checklist: Patient identified, Emergency Drugs available, Suction available and Patient being monitored Patient Re-evaluated:Patient Re-evaluated prior to induction Oxygen Delivery Method: Circle system utilized Preoxygenation: Pre-oxygenation with 100% oxygen Induction Type: IV induction, Rapid sequence and Cricoid Pressure applied Laryngoscope Size: Miller and 3 Grade View: Grade I Tube type: Oral Tube size: 7.5 mm Number of attempts: 1 Airway Equipment and Method: Stylet Placement Confirmation: ETT inserted through vocal cords under direct vision,  positive ETCO2 and breath sounds checked- equal and bilateral Secured at: 23 cm Tube secured with: Tape Dental Injury: Teeth and Oropharynx as per pre-operative assessment

## 2017-07-06 NOTE — ED Provider Notes (Signed)
[facility]PT Provider Note   CSN: [id number] Arrival date & time: 07/06/17  1358     History   Chief Complaint Chief Complaint  Patient presents with  . Abdominal Pain    HPI Joseph Cortez is a 51 y.o. male.  HPI   The patient is a 51 year old male, known history of cirrhosis, likely related to chronic alcohol abuse (quit 5 months ago), history of COPD, history of hypokalemia, hyponatremia and a history of a hernia in the periumbilical and the right inguinal region. The patient has had reducible hernias for many years, he reports that he is and told that he needs a liver transplant. He reports that this morning he started to develop some periumbilical pain around 8:00 in the morning, while he was trying to use the bathroom he had increased pain and tried to reduce his umbilical hernia but noted that it was extremely hard, discolored and tender. This is been persistent and gradually worsening and since that time he has developed multiple episodes of vomiting which his sister who is also here with him and is a partial historian reports is green emesis. There is no blood in his stools, he does take lactulose daily.  Past Medical History:  Diagnosis Date  . AKI (acute kidney injury) (HCC) 07/14/2016  . Anemia   . Anxiety   . Arthritis   . Blood transfusion without reported diagnosis   . Cirrhosis (HCC)   . COPD (chronic obstructive pulmonary disease) (HCC)   . Depression   . Dyspnea   . Hypokalemia 07/14/2016  . Hyponatremia 07/14/2016  . Neuromuscular disorder (HCC)    Neuropathy  . Neuropathy   . Substance abuse (HCC)   . Ulcer    BLEEDING VARICES, ULCERATIONS    Patient Active Problem List   Diagnosis Date Noted  . Strangulated umbilical hernia 07/06/2017  . Umbilical hernia with obstruction   . Rectal bleeding 04/28/2017  . Abdominal pain 11/20/2016  . Diarrhea 11/20/2016  . Tobacco abuse 11/20/2016  . Chronic midline low back pain without sciatica  09/01/2016  . Chronic insomnia 09/01/2016  . 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 Alcoholic hepatitis with ascites 07/14/2016  . Alcohol abuse 07/14/2016  . Anxiety 07/14/2016  . Coagulopathy (HCC) 07/14/2016  . Thrombocytopenia (HCC) 07/14/2016  . Elevated LFTs   . Esophageal varices without bleeding (HCC)   . Portal hypertensive gastropathy (HCC)   . Duodenal ulcer   . Reflux esophagitis   . Hepatic cirrhosis (HCC) 06/10/2015    Past Surgical History:  Procedure Laterality Date  . BIOPSY  06/27/2015   Procedure: BIOPSY (Gastric);  Surgeon: Corbin Ade, MD;  Location: [facility];  Service: Endoscopy;;  . CHOLECYSTECTOMY    . COLONOSCOPY WITH PROPOFOL N/A 05/24/2017   Procedure: COLONOSCOPY WITH PROPOFOL;  Surgeon: Corbin Ade, MD;  Location: [facility];  Service: Endoscopy;  Laterality: N/A;  8:30am  . ESOPHAGEAL BANDING N/A 01/30/2016   Procedure: ESOPHAGEAL BANDING;  Surgeon: Corbin Ade, MD;  Location: [facility];  Service: Endoscopy;  Laterality: N/A;  . ESOPHAGOGASTRODUODENOSCOPY  January 06, 2016   Dr. Alycia Rossetti at [facility]: duodenitis in bullb, portal gastropathy, 2 columns of large distal esophageal varices, one with flat red spot, s/p band ligation. Needs 4 week surveillance EGD   . ESOPHAGOGASTRODUODENOSCOPY (EGD) WITH PROPOFOL N/A 06/27/2015   WUJ:WJXB erosive reflux esophagitis, portal gastropathy, duodenal bulbar ulcer, negative H.pylori. Surveillance due yearly according to ASGE current guidelines regarding ETOH cirrhosis  and ongoing ETOH abuse  . ESOPHAGOGASTRODUODENOSCOPY (EGD) WITH PROPOFOL N/A 01/30/2016   Dr. Jena Gauss: Grade 2 varices s/p banding with complete deflation of varices, portal gastropathy, surveillance in May 2018  . ESOPHAGOGASTRODUODENOSCOPY (EGD) WITH PROPOFOL N/A 03/15/2017   4 columns of Grade 2 varices s/p banding and completely eradicated. cherry red spots overlying one of the columns distally. portal gastropathy. EGD in 1.5  years        Home Medications    Prior to Admission medications   Medication Sig Start Date End Date Taking? Authorizing Provider  acetaminophen (TYLENOL) 650 MG CR tablet Take 650 mg by mouth every 8 (eight) hours as needed for pain.   Yes [provider]  amitriptyline (ELAVIL) 25 MG tablet Take 1 tablet by mouth daily. 04/19/17  Yes [provider]  B Complex-Biotin-FA (B COMPLETE PO) Take 1 tablet by mouth daily.   Yes [provider]  bismuth subsalicylate (PEPTO BISMOL) 262 MG/15ML suspension Take 30 mLs by mouth every 6 (six) hours as needed (for diarrhea/loose stools or upset stomach.).    Yes [provider]  furosemide (LASIX) 20 MG tablet Take 2 tablets (40 mg total) by mouth daily. 02/03/17  Yes Gelene Mink, NP  furosemide (LASIX) 80 MG tablet Take 1 tablet by mouth daily. 06/28/17  Yes [provider]  gabapentin (NEURONTIN) 300 MG capsule Take 300-600 mg by mouth See admin instructions.  in the morning,  at 4 pm, and  at bedtime   Yes [provider]  lactulose (CHRONULAC) 10 GM/15ML solution TAKE 45 ML BY MOUTH 2 to 3 times daily 04/01/17  Yes Tiffany Kocher, PA-C  Multiple Vitamin (MULTIVITAMIN WITH MINERALS) TABS tablet Take 1 tablet by mouth daily.   Yes [provider]  ondansetron (ZOFRAN) 4 MG tablet Take 1 tablet (4 mg total) by mouth every 8 (eight) hours as needed for nausea or vomiting. 06/02/17  Yes Gelene Mink, NP  pantoprazole (PROTONIX) 40 MG tablet Take 1 tablet (40 mg total) by mouth 2 (two) times daily before a meal. Patient taking differently: Take 40 mg by mouth daily.  01/27/17  Yes Gelene Mink, NP  promethazine (PHENERGAN) 12.5 MG tablet Take 1 tablet (12.5 mg total) by mouth every 6 (six) hours as needed for nausea or vomiting. 06/02/17  Yes Gelene Mink, NP  spironolactone (ALDACTONE) 100 MG tablet Take 2 tablets by mouth daily. 06/28/17  Yes [provider]  Thiamine HCl  (VITAMIN B-1 PO) Take 1 tablet by mouth daily.   Yes [provider]    Family History Family History  Problem Relation Age of Onset  . Colon cancer Maternal Uncle   . Alcohol abuse Maternal Uncle   . Arthritis Mother   . Cancer Mother        breast cancer  . Hypertension Father   . Hyperlipidemia Father   . Lupus Sister   . Alcohol abuse Paternal Uncle   . Alzheimer's disease Maternal Grandmother   . COPD Maternal Grandfather   . Diabetes Maternal Grandfather   . Heart disease Paternal Grandfather   . Alcohol abuse Cousin     Social History Social History  Substance Use Topics  . Smoking status: Current Every Day Smoker    Types: Cigarettes  . Smokeless tobacco: Never Used     Comment: 3-4 cigarettes daily   . Alcohol use 16.8 oz/week    28 Glasses of wine per week     Comment:  last alcohol      Allergies   Sertraline   Review of Systems Review of Systems  All other systems reviewed and are negative.    Physical Exam Updated Vital Signs BP 111/76   Pulse 78   Resp 20   Ht 5\' 8"  (1.727 m)   Wt 87.1 kg (192 lb)   SpO2 100%   BMI 29.19 kg/m   Physical Exam  Constitutional: He appears well-developed and well-nourished. No distress.  HENT:  Head: Normocephalic and atraumatic.  Mouth/Throat: Oropharynx is clear and moist. No oropharyngeal exudate.  Eyes: Pupils are equal, round, and reactive to light. Conjunctivae and EOM are normal. Right eye exhibits no discharge. Left eye exhibits no discharge. No scleral icterus.  Neck: Normal range of motion. Neck supple. No JVD present. No thyromegaly present.  Cardiovascular: Normal rate, regular rhythm, normal heart sounds and intact distal pulses.  Exam reveals no gallop and no friction rub.   No murmur heard. Pulmonary/Chest: Effort normal and breath sounds normal. No respiratory distress. He has no wheezes. He has no rales.  Abdominal: Soft. Bowel sounds are normal. He exhibits no distension and no  mass. There is tenderness.  There is tenderness in the periumbilical region, this is hard, indurated and not reducible on my exam. He hasn't easily reducible inguinal hernia which is minimally tender, the rest of the abdomen is mildly diffusely tender.  Musculoskeletal: Normal range of motion. He exhibits no edema or tenderness.  Lymphadenopathy:    He has no cervical adenopathy.  Neurological: He is alert. Coordination normal.  Skin: Skin is warm and dry. No rash noted. No erythema.  Psychiatric: He has a normal mood and affect. His behavior is normal.  Nursing note and vitals reviewed.    ED Treatments / Results  Labs (all labs ordered are listed, but only abnormal results are displayed) Labs Reviewed  COMPREHENSIVE METABOLIC PANEL - Abnormal; Notable for the following:       Result Value   Sodium 134 (*)    Chloride 96 (*)    CO2 20 (*)    Glucose, Bld 151 (*)    Total Protein 8.4 (*)    AST 52 (*)    Alkaline Phosphatase 129 (*)    Total Bilirubin 2.3 (*)    Anion gap 18 (*)    All other components within normal limits  CBC - Abnormal; Notable for the following:    WBC 11.8 (*)    All other components within normal limits  I-STAT CG4 LACTIC ACID, ED - Abnormal; Notable for the following:    Lactic Acid, Venous 3.57 (*)    All other components within normal limits  LIPASE, BLOOD     Radiology Ct Abdomen Pelvis W Contrast  Result Date: 07/06/2017 CLINICAL DATA:  Abdominal pain with hernia suspected. EXAM: CT ABDOMEN AND PELVIS WITH CONTRAST TECHNIQUE: Multidetector CT imaging of the abdomen and pelvis was performed using the standard protocol following bolus administration of intravenous contrast. CONTRAST:  ISOVUE-300 IOPAMIDOL (ISOVUE-300) INJECTION 61% COMPARISON:  11/20/2016 FINDINGS: Lower chest:  Small layering right pleural effusion. Hepatobiliary: Cirrhosis with portal hypertension. There is recannulized periumbilical vein and small volume ascites. No discrete  mass lesion is seen when allowing for heterogeneity of the parenchymal enhancement.Cholecystectomy. No common bile duct dilatation. Pancreas: Unremarkable. Spleen: Mild splenomegaly.  No focal abnormality. Adrenals/Urinary Tract: Negative adrenals. No hydronephrosis or stone. Unremarkable bladder. Stomach/Bowel: High-grade small bowel obstruction with transition point at a umbilical hernia which contains a  knuckle of small bowel. Proximal jejunum is decompressed but a proximal transition point is not identified to suggest closed loop. The bowel wall is not thickened or nonenhancing. No perforation. No appendicitis. Vascular/Lymphatic: Portal hypertension with recannulized umbilical vein. Atherosclerotic calcification of the aorta. No acute finding. No mass or adenopathy. Reproductive:No pathologic findings. Other: Umbilical hernia described above. There is also a large right indirect inguinal hernia containing loops of nonobstructed small bowel. Musculoskeletal: No acute or aggressive finding. IMPRESSION: 1. High-grade small bowel obstruction due to umbilical hernia containing a knuckle of small bowel. No bowel necrosis or perforation identified. 2. Large right inguinal hernia containing multiple loops of nonobstructed small bowel. 3. Cirrhosis with portal hypertension.  Ascites is small volume. Electronically Signed   By: Marnee Spring M.D.   On: 07/06/2017 16:35    Procedures .Critical Care Performed by: Eber Hong Authorized by: Eber Hong   Critical care provider statement:    Critical care time (minutes):  35   Critical care was necessary to treat or prevent imminent or life-threatening deterioration of the following conditions: ischemic bowel.   Critical care was time spent personally by me on the following activities:  Development of treatment plan with patient or surrogate, discussions with consultants, evaluation of patient's response to treatment, examination of patient, obtaining  history from patient or surrogate, ordering and performing treatments and interventions, ordering and review of laboratory studies, ordering and review of radiographic studies, pulse oximetry, re-evaluation of patient's condition and review of old charts   (including critical care time)  Medications Ordered in ED Medications  0.9 %  sodium chloride infusion ( Intravenous New Bag/Given 07/06/17 1604)  ertapenem (INVANZ) 1 g in sodium chloride 0.9 % 50 mL IVPB (1 g Intravenous New Bag/Given 07/06/17 1704)  morphine 4 MG/ML injection 4 mg (4 mg Intravenous Given 07/06/17 1605)  ondansetron (ZOFRAN) injection 4 mg (4 mg Intravenous Given 07/06/17 1605)  iopamidol (ISOVUE-300) 61 % injection 100 mL (100 mLs Intravenous Contrast Given 07/06/17 1615)  morphine 4 MG/ML injection 4 mg (4 mg Intravenous Given by Other 07/06/17 1700)     Initial Impression / Assessment and Plan / ED Course  I have reviewed the triage vital signs and the nursing notes.  Pertinent labs & imaging results that were available during my care of the patient were reviewed by me and considered in my medical decision making (see chart for details).     hyperhyperbilirubinemia, slight anion gap acidosis, slight leukocytosis, mild hyponatremia, mild hyperglycemia.  The patient likely has an incarcerated or possibly strangulated hernia of his periumbilical wall. This will need to be imaged by CT scan. He will be given pain medication, check a lactic acid, IV fluids, IV antiemetics  D/w Dr. Henreitta Leber of General Surgery who will see at this time 3:48 PM  Dr. Henreitta Leber has seen the patient in person, has discussed care with Dr. Derrell Lolling.Dr. Derrell Lolling has requested hospitalist admission with surgical consultation.  Discussed with Dr. Jonah Blue of the medicine service, states that medicine can provide consultation but feels that the patient will likely go to the ICU when they get out and thus this should be a surgical patient.  Discussed  care with Dr. Tyson Alias, he has graciously agreed to accept the patient. I appreciate his willingness to take care of Mr. Paris.  The patient received Invanz as prophylactic antibiotics at surgery request. I do not think that the patient has sepsis, rather I think this is ischemic bowel from an incarcerated hernia.  No hypotension, no fever   Pt will be transferred to the ICU  Final Clinical Impressions(s) / ED Diagnoses   Final diagnoses:  Strangulated umbilical hernia  Lactic acid acidosis    New Prescriptions New Prescriptions   No medications on file     Eber Hong, MD 07/06/17 [phone]

## 2017-07-06 NOTE — ED Notes (Signed)
CRITICAL VALUE ALERT  Critical Value:  Istat Lactic Acid 3.57  Date & Time Notied:  07/06/17 1617  Provider Notified: Dr. Hyacinth Meeker  Orders Received/Actions taken: EDP notified, no further orders given

## 2017-07-06 NOTE — Progress Notes (Signed)
Patient arrived to room 2M09 at 1845 this evening. Unit staff and routine was explained.  Patient is A&O x 4 and has an obvious umbilical hernia with generalized ecchymosis over all.  Patient complaining of pain at this area.  GI in to see at this time to assess.  E-Link to be called.  Bed in low position and call light placed within reach.

## 2017-07-06 NOTE — Anesthesia Preprocedure Evaluation (Addendum)
Anesthesia Evaluation  Patient identified by MRN, date of birth, ID band Patient awake    Reviewed: Allergy & Precautions, NPO status , Patient's Chart, lab work & pertinent test results  Airway Mallampati: I       Dental  (+) Poor Dentition   Pulmonary shortness of breath, COPD,  COPD inhaler, Current Smoker,    Pulmonary exam normal        Cardiovascular Normal cardiovascular exam Rhythm:Regular Rate:Normal     Neuro/Psych    GI/Hepatic PUD, GERD  Medicated and Controlled,(+) Cirrhosis     substance abuse  alcohol use, Hepatitis - (recent tests nonreactive)Varices, hx of axcites   Endo/Other  negative endocrine ROS  Renal/GU      Musculoskeletal   Abdominal Normal abdominal exam  (+)   Peds  Hematology   Anesthesia Other Findings   Reproductive/Obstetrics                           Anesthesia Physical  Anesthesia Plan  ASA: III and emergent  Anesthesia Plan: General   Post-op Pain Management:    Induction: Intravenous, Cricoid pressure planned and Rapid sequence  PONV Risk Score and Plan: 4 or greater and Ondansetron, Dexamethasone, Midazolam and Treatment may vary due to age or medical condition  Airway Management Planned: Oral ETT  Additional Equipment:   Intra-op Plan:   Post-operative Plan: Possible Post-op intubation/ventilation  Informed Consent: I have reviewed the patients History and Physical, chart, labs and discussed the procedure including the risks, benefits and alternatives for the proposed anesthesia with the patient or authorized representative who has indicated his/her understanding and acceptance.   Dental advisory given  Plan Discussed with: CRNA  Anesthesia Plan Comments:       Anesthesia Quick Evaluation

## 2017-07-06 NOTE — Op Note (Signed)
PATIENT:  Joseph Cortez  51 y.o. male  PRE-OPERATIVE DIAGNOSIS:  Umbilical hernia  POST-OPERATIVE DIAGNOSIS:  Umbilical hernia  PROCEDURE:  Procedure(s): REDUCTION AND REPAIR OF STRANGULATED UMBILICAL HERNIA   SURGEON:  Surgeon(s): Orlander Norwood, De Blanch, MD  ASSISTANT: none   ANESTHESIA:   local and general  Indications for procedure: Joseph Cortez is a 51 y.o. year old male with symptoms of pain, nausea and vomiting. He presented to the ER and was diagnosed with incarcerated umbilical hernia with concern for strangulation.  Description of procedure: The patient was brought into the operative suite. Anesthesia was administered with General endotracheal anesthesia. WHO checklist was applied. The patient was then placed in supine. The area was prepped and draped in the usual sterile fashion.  Next the infraumbilical skin was anesthetized with 0.25% marcaine w epi. A semilunar incision was made to the left of the umbilicus. Cautery and blunt dissection was used to dissect down to the fascia. The hernia sac was entered and incised. The umbilical skin was dissected free of the hernia sac with cautery. There was a loop of small bowel that was edematous and engorged but had no signed of strangulation. The fascia was extended inferiorly, in doing so a cautery burn was made on the small intestine. This burn was repaired with a 3-0 silk lembert suture. The contents of the hernia sac were reduced. The hernia defect was 2 cm in diameter. The hernia sac was removed. The fascial defect was then primarily closed with running 0 PDS sutures. Next, a 6 x 6cm bard mesh was sutured to the fascia with interrupted 0 prolene. The umbilical skin was sutured to the fascia with a 3-0 vicryl. The deep dermal space was closed with a 3-0 vicryl. 10 ml 0.25% marcaine with epinephrine was injected into the muscle layer and around the fascia. The skin was closed with a 4-0 monocryl subcuticular suture. Dermabond was put in  place for dressing. The patient awoke from anesthesia and was brought to pacu in stable condition. All counts were correct.  Findings: small bowel edematous and engorged with no signs of strangulation.  Specimen: none  Blood loss: Total I/O In: 1250 [I.V.:1000; IV Piggyback:250] Out: 475 [Urine:225; Emesis/NG output:250] ml  Local anesthesia: 20 ml 0.25% marcaine with epinephrine  Complications: none  PLAN OF CARE: Admit to inpatient   PATIENT DISPOSITION:  PACU - hemodynamically stable.  Feliciana Rossetti, M.D. General, Bariatric, & Minimally Invasive Surgery [facility], [place]  07/06/2017 9:38 PM

## 2017-07-07 ENCOUNTER — Encounter (HOSPITAL_COMMUNITY): Payer: Self-pay | Admitting: General Surgery

## 2017-07-07 LAB — CBC
HCT: 40.3 % (ref 39.0–52.0)
HEMOGLOBIN: 13.6 g/dL (ref 13.0–17.0)
MCH: 30.4 pg (ref 26.0–34.0)
MCHC: 33.7 g/dL (ref 30.0–36.0)
MCV: 90.2 fL (ref 78.0–100.0)
PLATELETS: 122 10*3/uL — AB (ref 150–400)
RBC: 4.47 MIL/uL (ref 4.22–5.81)
RDW: 14 % (ref 11.5–15.5)
WBC: 10.8 10*3/uL — ABNORMAL HIGH (ref 4.0–10.5)

## 2017-07-07 LAB — SURGICAL PCR SCREEN
MRSA, PCR: NEGATIVE
STAPHYLOCOCCUS AUREUS: NEGATIVE

## 2017-07-07 LAB — BASIC METABOLIC PANEL
Anion gap: 11 (ref 5–15)
BUN: 12 mg/dL (ref 6–20)
CHLORIDE: 105 mmol/L (ref 101–111)
CO2: 20 mmol/L — ABNORMAL LOW (ref 22–32)
CREATININE: 0.77 mg/dL (ref 0.61–1.24)
Calcium: 8.9 mg/dL (ref 8.9–10.3)
GFR calc Af Amer: >60 mL/min (ref >60)
GFR calc non Af Amer: >60 mL/min (ref >60)
GLUCOSE: 145 mg/dL — AB (ref 65–99)
POTASSIUM: 4.7 mmol/L (ref 3.5–5.1)
SODIUM: 136 mmol/L (ref 135–145)

## 2017-07-07 LAB — ABO/RH: ABO/RH(D): O NEG

## 2017-07-07 LAB — MAGNESIUM: MAGNESIUM: 1.9 mg/dL (ref 1.7–2.4)

## 2017-07-07 LAB — PHOSPHORUS: Phosphorus: 3.5 mg/dL (ref 2.5–4.6)

## 2017-07-07 LAB — GLUCOSE, CAPILLARY: GLUCOSE-CAPILLARY: 142 mg/dL — AB (ref 65–99)

## 2017-07-07 MED ORDER — NICOTINE 14 MG/24HR TD PT24: 14.0000 mg | MEDICATED_PATCH | Freq: Every day | TRANSDERMAL | Status: DC

## 2017-07-07 MED ORDER — NICOTINE 7 MG/24HR TD PT24
7.0000 mg | MEDICATED_PATCH | Freq: Every day | TRANSDERMAL | Status: DC
  Administered 2017-07-07 – 2017-07-11 (×5): 7 mg via TRANSDERMAL
  Filled 2017-07-07 (×5): qty 1

## 2017-07-07 MED ORDER — ZOLPIDEM TARTRATE 5 MG PO TABS
5.0000 mg | ORAL_TABLET | Freq: Every evening | ORAL | Status: DC | PRN
  Administered 2017-07-07 – 2017-07-10 (×3): 5 mg via ORAL
  Filled 2017-07-07 (×3): qty 1

## 2017-07-07 NOTE — Progress Notes (Signed)
eLink Physician-Brief Progress Note Patient Name: Tabor Denham DOB: 05-Jul-1966 MRN: [id number]   Date of Service  07/07/2017  HPI/Events of Note  Insomnia  eICU Interventions  Ambien 5 mg po qhs PRN     Intervention Category Minor Interventions: Routine modifications to care plan (e.g. PRN medications for pain, fever)  Louanne Calvillo 07/07/2017, 7:36 PM

## 2017-07-07 NOTE — Anesthesia Postprocedure Evaluation (Signed)
Anesthesia Post Note  Patient: Joseph Cortez  Procedure(s) Performed: REDUCTION AND REPAIR OF STRANGULATED UMBILICAL HERNIA (N/A Abdomen)     Patient location during evaluation: PACU Anesthesia Type: General Level of consciousness: sedated and patient cooperative Pain management: pain level controlled Vital Signs Assessment: post-procedure vital signs reviewed and stable Respiratory status: spontaneous breathing Cardiovascular status: stable Anesthetic complications: no    Last Vitals:  Vitals:   07/06/17 2300 07/06/17 2359  BP: 126/76   Pulse: 85   Resp: 13   Temp:  36.7 C  SpO2: 100%     Last Pain:  Vitals:   07/06/17 2359  TempSrc: Oral  PainSc:                  Lewie Loron

## 2017-07-07 NOTE — Progress Notes (Signed)
[facility] Progress Note  1 Day Post-Op  Subjective: CC: abdominal pain Patient feels like pain is actually less than what he was expecting. Denies nausea, distention. No flatus.  UOP good. VSS.   Objective: Vital signs in last 24 hours: Temp:  [97.3 F (36.3 C)-98.4 F (36.9 C)] 97.5 F (36.4 C) (10/10 0757) Pulse Rate:  [58-97] 63 (10/10 0800) Resp:  [11-20] 12 (10/10 0800) BP: (101-138)/(69-89) 101/74 (10/10 0700) SpO2:  [96 %-100 %] 99 % (10/10 0800) Weight:  [86.3 kg (190 lb 4.1 oz)-87.4 kg (192 lb 10.9 oz)] 87.4 kg (192 lb 10.9 oz) (10/10 0500) Last BM Date: 07/05/17  Intake/Output from previous day: 10/09 0701 - 10/10 0700 In: 3807.7 [I.V.:3457.5; IV Piggyback:350.2] Out: 1225 [Urine:675; Emesis/NG output:500; Blood:50] Intake/Output this shift: Total I/O In: 110 [I.V.:110] Out: -   PE: Gen:  Alert, NAD, pleasant Card:  Regular rate and rhythm, pedal pulses 2+ BL Pulm:  Normal effort, clear to auscultation bilaterally Abd: Soft, non-tender, mildly distended, bowel sounds present, no HSM, incision C/D/I Skin: warm and dry, no rashes  Psych: A&Ox3   Lab Results:   Recent Labs  07/06/17 1454 07/07/17 0416  WBC 11.8* 10.8*  HGB 15.6 13.6  HCT 44.2 40.3  PLT 171 122*   BMET  Recent Labs  07/06/17 1454 07/07/17 0416  NA 134* 136  K 4.1 4.7  CL 96* 105  CO2 20* 20*  GLUCOSE 151* 145*  BUN 14 12  CREATININE 0.89 0.77  CALCIUM 9.9 8.9   PT/INR  Recent Labs  07/06/17 2249  LABPROT 18.0*  INR 1.51   CMP     Component Value Date/Time   NA 136 07/07/2017 0416   K 4.7 07/07/2017 0416   CL 105 07/07/2017 0416   CO2 20 (L) 07/07/2017 0416   GLUCOSE 145 (H) 07/07/2017 0416   BUN 12 07/07/2017 0416   CREATININE 0.77 07/07/2017 0416   CREATININE 0.80 01/06/2017 1159   CALCIUM 8.9 07/07/2017 0416   PROT 8.4 (H) 07/06/2017 1454   ALBUMIN 4.9 07/06/2017 1454   AST 52 (H) 07/06/2017 1454   ALT 30 07/06/2017 1454   ALKPHOS 129 (H)  07/06/2017 1454   BILITOT 2.3 (H) 07/06/2017 1454   GFRNONAA >60 07/07/2017 0416   GFRNONAA >89 01/06/2017 1159   GFRAA >60 07/07/2017 0416   GFRAA >89 01/06/2017 1159   Lipase     Component Value Date/Time   LIPASE 45 07/06/2017 1454       Studies/Results: Dg Abd 1 View  Result Date: 07/06/2017 CLINICAL DATA:  Encounter for nasogastric tube placement EXAM: ABDOMEN - 1 VIEW COMPARISON:  Portable exam 2310 hours compared to earlier CT exam of 07/06/2017 FINDINGS: Nasogastric tube is present with tip projecting over proximal gastric antrum. Visualized bowel gas pattern unremarkable. Small amount of excreted contrast material within the renal collecting systems bilaterally. Lung bases clear. Surgical clips RIGHT upper quadrant question cholecystectomy. Bones demineralized. IMPRESSION: Tip of nasogastric tube projects over proximal gastric antrum. Electronically Signed   By: Ulyses Southward M.D.   On: 07/06/2017 23:44   Ct Abdomen Pelvis W Contrast  Result Date: 07/06/2017 CLINICAL DATA:  Abdominal pain with hernia suspected. EXAM: CT ABDOMEN AND PELVIS WITH CONTRAST TECHNIQUE: Multidetector CT imaging of the abdomen and pelvis was performed using the standard protocol following bolus administration of intravenous contrast. CONTRAST:  ISOVUE-300 IOPAMIDOL (ISOVUE-300) INJECTION 61% COMPARISON:  11/20/2016 FINDINGS: Lower chest:  Small layering right pleural effusion. Hepatobiliary: Cirrhosis with portal  hypertension. There is recannulized periumbilical vein and small volume ascites. No discrete mass lesion is seen when allowing for heterogeneity of the parenchymal enhancement.Cholecystectomy. No common bile duct dilatation. Pancreas: Unremarkable. Spleen: Mild splenomegaly.  No focal abnormality. Adrenals/Urinary Tract: Negative adrenals. No hydronephrosis or stone. Unremarkable bladder. Stomach/Bowel: High-grade small bowel obstruction with transition point at a umbilical hernia which contains  a knuckle of small bowel. Proximal jejunum is decompressed but a proximal transition point is not identified to suggest closed loop. The bowel wall is not thickened or nonenhancing. No perforation. No appendicitis. Vascular/Lymphatic: Portal hypertension with recannulized umbilical vein. Atherosclerotic calcification of the aorta. No acute finding. No mass or adenopathy. Reproductive:No pathologic findings. Other: Umbilical hernia described above. There is also a large right indirect inguinal hernia containing loops of nonobstructed small bowel. Musculoskeletal: No acute or aggressive finding. IMPRESSION: 1. High-grade small bowel obstruction due to umbilical hernia containing a knuckle of small bowel. No bowel necrosis or perforation identified. 2. Large right inguinal hernia containing multiple loops of nonobstructed small bowel. 3. Cirrhosis with portal hypertension.  Ascites is small volume. Electronically Signed   By: Marnee Spring M.D.   On: 07/06/2017 16:35    Anti-infectives: Anti-infectives    Start     Dose/Rate Route Frequency Ordered Stop   07/06/17 1645  ertapenem (INVANZ) 1 g in sodium chloride 0.9 % 50 mL IVPB     1 g 100 mL/hr over 30 Minutes Intravenous  Once 07/06/17 1634 07/06/17 1734       Assessment/Plan Compensated cirrhosis related to Hx of alcohol abuse Hx of COPD  Incarcerated umbilical hernia  S/P open reduction and repair of incarcerated umbilical hernia 07/06/17 Dr. Sheliah Hatch - POD#1 - NGT with 650 cc output - no flatus, but good bowel sounds - clamping trial today - mobilize as tolerated, OOB, IS Leukocytosis - WBC 10.8 this AM, afebrile    FEN: NPO, IVF VTE: SCDs ID: Invanz periop  Plan: Agree with transfer to med-surg floor. Clamp trials, if tolerating will start CLD. Monitor labs.   LOS: 1 day    Wells Guiles , [facility] 07/07/2017, 8:09 AM Pager: [phone] Consults: [phone] Mon-Fri 7:00 am-4:30 pm Sat-Sun 7:00  am-11:30 am

## 2017-07-07 NOTE — Discharge Instructions (Signed)
[facility] _______[facility] ° °UMBILICAL OR INGUINAL HERNIA REPAIR: POST OP INSTRUCTIONS ° °Always review your discharge instruction sheet given to you by the facility where your surgery was performed. °IF YOU HAVE DISABILITY OR FAMILY LEAVE FORMS, YOU MUST BRING THEM TO THE OFFICE FOR PROCESSING.   °DO NOT GIVE THEM TO YOUR DOCTOR. ° °1. A  prescription for pain medication may be given to you upon discharge.  Take your pain medication as prescribed, if needed.  If narcotic pain medicine is not needed, then you may take acetaminophen (Tylenol) or ibuprofen (Advil) as needed. °2. Take your usually prescribed medications unless otherwise directed. °If you need a refill on your pain medication, please contact your pharmacy.  They will contact our office to request authorization. Prescriptions will not be filled after 5 pm or on week-ends. °3. You should follow a light diet the first 24 hours after arrival home, such as soup and crackers, etc.  Be sure to include lots of fluids daily.  Resume your normal diet the day after surgery. °4.Most patients will experience some swelling and bruising around the umbilicus or in the groin and scrotum.  Ice packs and reclining will help.  Swelling and bruising can take several days to resolve.  °6. It is common to experience some constipation if taking pain medication after surgery.  Increasing fluid intake and taking a stool softener (such as Colace) will usually help or prevent this problem from occurring.  A mild laxative (Milk of Magnesia or Miralax) should be taken according to package directions if there are no bowel movements after 48 hours. °7. Unless discharge instructions indicate otherwise, you may remove your bandages 24-48 hours after surgery, and you may shower at that time.  You may have steri-strips (small skin tapes) in place directly over the incision.  These strips should be left on the skin for 7-10 days.  If your surgeon used skin glue on the  incision, you may shower in 24 hours.  The glue will flake off over the next 2-3 weeks.  Any sutures or staples will be removed at the office during your follow-up visit. °8. ACTIVITIES:  You may resume regular (light) daily activities beginning the next day--such as daily self-care, walking, climbing stairs--gradually increasing activities as tolerated.  You may have sexual intercourse when it is comfortable.  Refrain from any heavy lifting or straining until approved by your doctor. ° °a.You may drive when you are no longer taking prescription pain medication, you can comfortably wear a seatbelt, and you can safely maneuver your car and apply brakes. ° °9.You should see your doctor in the office for a follow-up appointment approximately 2-3 weeks after your surgery.  Make sure that you call for this appointment within a day or two after you arrive home to insure a convenient appointment time. ° ° °WHEN TO CALL YOUR DOCTOR: °1. Fever over 101.0 °2. Inability to urinate °3. Nausea and/or vomiting °4. Extreme swelling or bruising °5. Continued bleeding from incision. °6. Increased pain, redness, or drainage from the incision ° °The clinic staff is available to answer your questions during regular business hours.  Please don’t hesitate to call and ask to speak to one of the nurses for clinical concerns.  If you have a medical emergency, go to the nearest emergency room or call 911.  A surgeon from [facility] is always on call at the hospital ° ° °[address], [address], [place], [place]  [address] ? °   [address] [address], [place], [place]   [address] °[phone] ? [phone] ? FAX [phone] °Web site: [email or web address] °

## 2017-07-07 NOTE — Progress Notes (Signed)
PULMONARY / CRITICAL CARE MEDICINE   Name: Joseph Cortez MRN: [id number] DOB: 1965/11/23    ADMISSION DATE:  07/06/2017 CONSULTATION DATE:  07/06/2017  REFERRING MD:  Dr. Hyacinth Meeker  CHIEF COMPLAINT:  Incarcerated strangulated umbilical hernia   HISTORY OF PRESENT ILLNESS:   51 year old male with PMH of alcoholic cirrhosis (quit drinking 01/2017), ? Current smoker, reported COPD, periumbilical and right inguinal hernias who presented to [facility] ER with complaints of severe periumbilical abdominal pain, with firm distention, discoloration, tenderness, and associated nausea and vomiting with bilious emesis that started around 0800.  Vomiting relieves discomfort.   Patient has had reducible hernias for many years.  Recently seen by Dr. Lovell Sheehan at [facility] electively 04/2017 and due to his cirrhosis, he was not a candidate to have his periumbilical hernia repaired at [facility].  In ER, patient was found to have tender abdomen with hard, indurated, non reducible periumbilical hernia.  CT showing high-grade small bowel obstruction due to umbilical hernia containing small bowel.  Labs notable for AG 18, WBC 11.8, and lactic acid 3.57.  He has been afebrile and normotensive.  Empiric Invanz per surgery given.  Due to patients history of cirrhosis, patient to be transferred to [facility] for surgery by Dr. Derrell Lolling w/[facility] and PCCM to admit as patient will require ICU post-operatively.    SUBJECTIVE: No acute events.     VITAL SIGNS: BP 103/76 (BP Location: Left Arm)   Pulse 63   Temp (!) 97.5 F (36.4 C) (Oral) Comment: Victorino Dike, RN notified  Resp 12   Ht  (1.727 m)   Wt 87.4 kg (192 lb 10.9 oz)   SpO2 99%   BMI 29.30 kg/m   HEMODYNAMICS:    VENTILATOR SETTINGS:    INTAKE / OUTPUT: I/O last 3 completed shifts: In: 3807.7 [I.V.:3457.5; IV Piggyback:350.2] Out: 1225 [Urine:675; Emesis/NG output:500; Blood:50]  PHYSICAL EXAMINATION:  General:  Middle aged adult male in NAD Neuro:  Alert  oriented, non-focal HEENT:  [place]/AT, PERRL, no JVD Cardiovascular:  RRR, no MRG Lungs:  CTAB Abdomen:  Soft, non-tender, non-distended. Surgical dressings C/D/I Musculoskeletal: No acute deformity or ROM limitation Skin:  Grossly intact  LABS:  BMET  Recent Labs Lab 07/06/17 1454 07/07/17 0416  NA 134* 136  K 4.1 4.7  CL 96* 105  CO2 20* 20*  BUN 14 12  CREATININE 0.89 0.77  GLUCOSE 151* 145*    Electrolytes  Recent Labs Lab 07/06/17 1454 07/07/17 0416  CALCIUM 9.9 8.9  MG  --  1.9  PHOS  --  3.5    CBC  Recent Labs Lab 07/06/17 1454 07/07/17 0416  WBC 11.8* 10.8*  HGB 15.6 13.6  HCT 44.2 40.3  PLT 171 122*    Coag's  Recent Labs Lab 07/06/17 2249  APTT 42*  INR 1.51    Sepsis Markers  Recent Labs Lab 07/06/17 1615  LATICACIDVEN 3.57*    ABG No results for input(s): PHART, PCO2ART, PO2ART in the last 168 hours.  Liver Enzymes  Recent Labs Lab 07/06/17 1454  AST 52*  ALT 30  ALKPHOS 129*  BILITOT 2.3*  ALBUMIN 4.9    Cardiac Enzymes No results for input(s): TROPONINI, PROBNP in the last 168 hours.  Glucose  Recent Labs Lab 07/06/17 1949 07/06/17 2357 07/07/17 0350  GLUCAP 104* 123* 142*    Imaging Dg Abd 1 View  Result Date: 07/06/2017 CLINICAL DATA:  Encounter for nasogastric tube placement EXAM: ABDOMEN - 1 VIEW COMPARISON:  Portable exam  2310 hours compared to earlier CT exam of 07/06/2017 FINDINGS: Nasogastric tube is present with tip projecting over proximal gastric antrum. Visualized bowel gas pattern unremarkable. Small amount of excreted contrast material within the renal collecting systems bilaterally. Lung bases clear. Surgical clips RIGHT upper quadrant question cholecystectomy. Bones demineralized. IMPRESSION: Tip of nasogastric tube projects over proximal gastric antrum. Electronically Signed   By: Ulyses Southward M.D.   On: 07/06/2017 23:44   Ct Abdomen Pelvis W Contrast  Result Date: 07/06/2017 CLINICAL DATA:   Abdominal pain with hernia suspected. EXAM: CT ABDOMEN AND PELVIS WITH CONTRAST TECHNIQUE: Multidetector CT imaging of the abdomen and pelvis was performed using the standard protocol following bolus administration of intravenous contrast. CONTRAST:  ISOVUE-300 IOPAMIDOL (ISOVUE-300) INJECTION 61% COMPARISON:  11/20/2016 FINDINGS: Lower chest:  Small layering right pleural effusion. Hepatobiliary: Cirrhosis with portal hypertension. There is recannulized periumbilical vein and small volume ascites. No discrete mass lesion is seen when allowing for heterogeneity of the parenchymal enhancement.Cholecystectomy. No common bile duct dilatation. Pancreas: Unremarkable. Spleen: Mild splenomegaly.  No focal abnormality. Adrenals/Urinary Tract: Negative adrenals. No hydronephrosis or stone. Unremarkable bladder. Stomach/Bowel: High-grade small bowel obstruction with transition point at a umbilical hernia which contains a knuckle of small bowel. Proximal jejunum is decompressed but a proximal transition point is not identified to suggest closed loop. The bowel wall is not thickened or nonenhancing. No perforation. No appendicitis. Vascular/Lymphatic: Portal hypertension with recannulized umbilical vein. Atherosclerotic calcification of the aorta. No acute finding. No mass or adenopathy. Reproductive:No pathologic findings. Other: Umbilical hernia described above. There is also a large right indirect inguinal hernia containing loops of nonobstructed small bowel. Musculoskeletal: No acute or aggressive finding. IMPRESSION: 1. High-grade small bowel obstruction due to umbilical hernia containing a knuckle of small bowel. No bowel necrosis or perforation identified. 2. Large right inguinal hernia containing multiple loops of nonobstructed small bowel. 3. Cirrhosis with portal hypertension.  Ascites is small volume. Electronically Signed   By: Marnee Spring M.D.   On: 07/06/2017 16:35   STUDIES:  CT abd 10/9 >> small  right pleural effusion; cirrhosis w/portal HTN, small volume ascites, high-grade SBO w/transition at umbilical hernia which contains a knuckle of small bowel; no bowel necrosis or perforation seen; large right inguinal hernia w/ multiple loops of nonobstructed bowel  CULTURES: [facility] 10/9 >>  ANTIBIOTICS: 10/9 Invanz in ED  SIGNIFICANT EVENTS: 10/9 transfer from [facility] to [facility]; OR for reduction and repair of strangulated umbilical hernia 10/10 transferred out of ICU  LINES/TUBES:   DISCUSSION: 39 yoM w/PMH of alcoholic cirrhosis (quit in May 2018)  and chronic previously reduicable hernias who presented with abd pain w/nausea and vomiting on 10/9 morning.  Found to have incarcerated strangulated umbilical hernia with lactate 3.57. Afebrile/ hemodynamically stable.  [facility] for surgery due to hx of cirrhosis.   ASSESSMENT / PLAN:  PULMONARY A: Reported hx COPD (no PFTs or CT chest, no home nebs) Tobacco abuse P:   Supplemental O2 as indicated No outpatient COPD medications Nicotine patch Tobacco cessation counseling  CARDIOVASCULAR A:  No acute issues  P:  Tele monitoring Trend lactate   RENAL A:   No acute issues P:   IVF Trend BMP / mag/ phos/ urinary output Replace electrolytes as indicated  GASTROINTESTINAL A:  Incarcerated strangulated umbilical hernia - s/p surgical repair 10/9 Hx Alcoholic cirrhosis (sobriety in 01/2017), right inguinal hernia P:   Post op care per surgery Holding pre-admit lasix, spironolactone, lactulose  HEMATOLOGIC  A:   VTE prophylaxis P:  SCD's Follow CBC  INFECTIOUS A:   Surgical ppx - received invanz in ED P:   Hold off ABX for now  ENDOCRINE A:   Hyperglycemia  (last HgbA1C 4.8 on 12/2016) P:   SSI if glucose consistently > 180  NEUROLOGIC A:   Acute encephalopathy- related to sedation  Hx depression, anxiety, neuropathy Hx hepatic encephalopthy P:   Hold pre-admit Elavil, Neurontin Continue  thiamine, folate Holding lactulose while NPO   FAMILY  - Updates: family updated bedside  - Inter-disciplinary family meet or Palliative Care meeting due by:  10/16  Transfer to med surge and will ask TR[facility] to assume care starting AM 10/11.  PCCM off at that time.   Rutherford Guys, [place] [facility] Pager: [phone]  or [phone] 07/07/2017, 9:10 AM

## 2017-07-08 DIAGNOSIS — F10121 Alcohol abuse with intoxication delirium: Secondary | ICD-10-CM

## 2017-07-08 DIAGNOSIS — Z720 Tobacco use: Secondary | ICD-10-CM

## 2017-07-08 DIAGNOSIS — K7030 Alcoholic cirrhosis of liver without ascites: Secondary | ICD-10-CM

## 2017-07-08 MED ORDER — DEXTROSE 5 % IV SOLN
500.0000 mg | Freq: Three times a day (TID) | INTRAVENOUS | Status: DC | PRN
  Administered 2017-07-08 – 2017-07-09 (×3): 500 mg via INTRAVENOUS
  Filled 2017-07-08 (×3): qty 5

## 2017-07-08 MED ORDER — PHENOL 1.4 % MT LIQD
1.0000 | OROMUCOSAL | Status: DC | PRN
  Administered 2017-07-08: 1 via OROMUCOSAL
  Filled 2017-07-08: qty 177

## 2017-07-08 NOTE — Progress Notes (Signed)
Patient asking to remove NG tube, called on call MD Janee Morn said he did not know enough about patient to say that was okay. I relayed that information to the patient and he said he wants it out and will take out himself if we don't. He said he would assume the responsibility and if they have to end up putting it back in, he was okay with that. This RN removed for safety of patient. I told him to only do small amount of ice chips and walk a lot tonight to help promote gas. Will continue to follow.

## 2017-07-08 NOTE — Progress Notes (Signed)
Patient ID: Joseph Cortez, male   DOB: Dec 04, 1965, 51 y.o.   MRN: [id number]  [facility] Surgery Progress Note  2 Days Post-Op  Subjective: CC- abdominal pain Patient states that morphine is not helping his pain. Denies abdominal distension, nausea, vomiting. Tolerating clamping trials. No flatus. Has not ambulated much, but plans to do more today.  Objective: Vital signs in last 24 hours: Temp:  [97.5 F (36.4 C)-98 F (36.7 C)] 97.8 F (36.6 C) (10/11 0442) Pulse Rate:  [54-68] 55 (10/11 0442) Resp:  [11-19] 17 (10/11 0442) BP: (95-115)/(60-72) 101/60 (10/11 0442) SpO2:  [99 %-100 %] 99 % (10/11 0442) Weight:  [193 lb (87.5 kg)] 193 lb (87.5 kg) (10/10 1158) Last BM Date: 07/06/17  Intake/Output from previous day: 10/10 0701 - 10/11 0700 In: 390 [I.V.:340; IV Piggyback:50] Out: 1750 [Urine:1100; Emesis/NG output:650] Intake/Output this shift: No intake/output data recorded.  PE: Gen:  Alert, NAD, pleasant HEENT: EOM's intact, pupils equal and round Pulm:  effort normal Abd: Soft, mild distension, nontender, +BS, incision C/D/I Psych: A&Ox3  Skin: no rashes noted, warm and dry  Lab Results:   Recent Labs  07/06/17 1454 07/07/17 0416  WBC 11.8* 10.8*  HGB 15.6 13.6  HCT 44.2 40.3  PLT 171 122*   BMET  Recent Labs  07/06/17 1454 07/07/17 0416  NA 134* 136  K 4.1 4.7  CL 96* 105  CO2 20* 20*  GLUCOSE 151* 145*  BUN 14 12  CREATININE 0.89 0.77  CALCIUM 9.9 8.9   PT/INR  Recent Labs  07/06/17 2249  LABPROT 18.0*  INR 1.51   CMP     Component Value Date/Time   NA 136 07/07/2017 0416   K 4.7 07/07/2017 0416   CL 105 07/07/2017 0416   CO2 20 (L) 07/07/2017 0416   GLUCOSE 145 (H) 07/07/2017 0416   BUN 12 07/07/2017 0416   CREATININE 0.77 07/07/2017 0416   CREATININE 0.80 01/06/2017 1159   CALCIUM 8.9 07/07/2017 0416   PROT 8.4 (H) 07/06/2017 1454   ALBUMIN 4.9 07/06/2017 1454   AST 52 (H) 07/06/2017 1454   ALT 30 07/06/2017 1454    ALKPHOS 129 (H) 07/06/2017 1454   BILITOT 2.3 (H) 07/06/2017 1454   GFRNONAA >60 07/07/2017 0416   GFRNONAA >89 01/06/2017 1159   GFRAA >60 07/07/2017 0416   GFRAA >89 01/06/2017 1159   Lipase     Component Value Date/Time   LIPASE 45 07/06/2017 1454       Studies/Results: Dg Abd 1 View  Result Date: 07/06/2017 CLINICAL DATA:  Encounter for nasogastric tube placement EXAM: ABDOMEN - 1 VIEW COMPARISON:  Portable exam 2310 hours compared to earlier CT exam of 07/06/2017 FINDINGS: Nasogastric tube is present with tip projecting over proximal gastric antrum. Visualized bowel gas pattern unremarkable. Small amount of excreted contrast material within the renal collecting systems bilaterally. Lung bases clear. Surgical clips RIGHT upper quadrant question cholecystectomy. Bones demineralized. IMPRESSION: Tip of nasogastric tube projects over proximal gastric antrum. Electronically Signed   By: Ulyses Southward M.D.   On: 07/06/2017 23:44   Ct Abdomen Pelvis W Contrast  Result Date: 07/06/2017 CLINICAL DATA:  Abdominal pain with hernia suspected. EXAM: CT ABDOMEN AND PELVIS WITH CONTRAST TECHNIQUE: Multidetector CT imaging of the abdomen and pelvis was performed using the standard protocol following bolus administration of intravenous contrast. CONTRAST:  ISOVUE-300 IOPAMIDOL (ISOVUE-300) INJECTION 61% COMPARISON:  11/20/2016 FINDINGS: Lower chest:  Small layering right pleural effusion. Hepatobiliary: Cirrhosis with portal hypertension. There  is recannulized periumbilical vein and small volume ascites. No discrete mass lesion is seen when allowing for heterogeneity of the parenchymal enhancement.Cholecystectomy. No common bile duct dilatation. Pancreas: Unremarkable. Spleen: Mild splenomegaly.  No focal abnormality. Adrenals/Urinary Tract: Negative adrenals. No hydronephrosis or stone. Unremarkable bladder. Stomach/Bowel: High-grade small bowel obstruction with transition point at a umbilical hernia  which contains a knuckle of small bowel. Proximal jejunum is decompressed but a proximal transition point is not identified to suggest closed loop. The bowel wall is not thickened or nonenhancing. No perforation. No appendicitis. Vascular/Lymphatic: Portal hypertension with recannulized umbilical vein. Atherosclerotic calcification of the aorta. No acute finding. No mass or adenopathy. Reproductive:No pathologic findings. Other: Umbilical hernia described above. There is also a large right indirect inguinal hernia containing loops of nonobstructed small bowel. Musculoskeletal: No acute or aggressive finding. IMPRESSION: 1. High-grade small bowel obstruction due to umbilical hernia containing a knuckle of small bowel. No bowel necrosis or perforation identified. 2. Large right inguinal hernia containing multiple loops of nonobstructed small bowel. 3. Cirrhosis with portal hypertension.  Ascites is small volume. Electronically Signed   By: Marnee Spring M.D.   On: 07/06/2017 16:35    Anti-infectives: Anti-infectives    Start     Dose/Rate Route Frequency Ordered Stop   07/06/17 1645  ertapenem (INVANZ) 1 g in sodium chloride 0.9 % 50 mL IVPB     1 g 100 mL/hr over 30 Minutes Intravenous  Once 07/06/17 1634 07/06/17 1734       Assessment/Plan Compensated cirrhosis related to Hx of alcohol abuse Hx of COPD  Incarcerated umbilical hernia  S/P open reduction and repair of incarcerated umbilical hernia 07/06/17 Dr. Sheliah Hatch - POD#2 - tolerating clamping trials with no n/v and has good BS on exam, but he is not passing flatus Leukocytosis - afebrile, recheck labs in AM  FEN: IVF, clamp NG and give few sips of clears from the floor VTE: SCDs ID: Invanz periop  Plan: Keep NG tube clamped and allow few sips of clears from the floor. Encourage more ambulation today. Add robaxin for better pain control. Ok to shower. Labs in AM.   LOS: 2 days    Franne Forts , [facility]  Surgery 07/08/2017, 8:09 AM Pager: [phone] Consults: [phone] Mon-Fri 7:00 am-4:30 pm Sat-Sun 7:00 am-11:30 am

## 2017-07-08 NOTE — Progress Notes (Signed)
PROGRESS NOTE    Joseph Cortez  UJW:[id number] DOB: 1965/12/01 DOA: 07/06/2017 PCP: Eustace Moore, MD    Brief Narrative:  51 year old male who presented with incarcerated/strangulated umbilical hernia. Patient does have significant past medical history of alcoholic cirrhosis, tobacco abuse, and COPD. Patient presents to the [facility] emergency department with severe periumbilical abdominal pain, associated with distention, discoloration, tenderness, nausea and vomiting. CT of the abdomen show high-grade small bowel obstruction due to umbilical hernia containing small bowel. Patient was transferred to [facility] cone due to high risk surgical candidate. On initial physical examination his blood pressure 133/87, heart rate 69, temperature 98.2, respiratory 20, oxygen saturation 99%. Moist mucous membranes, lungs were clear to auscultation bilaterally, no wheezing rales or rhonchi heart S1 S2 present, rhythmic, no gallops, rubs or murmurs, the abdomen was distended, nonreducible umbilical hernia with cyanotic discoloration, no lower extremity edema. Sodium 134, potassium 4.1, chloride 96, bicarbonate 20, glucose 151, BUN 14, creatinine 0.89, venous lactic acid 3.5, white count 11.8 hemoglobin 15.6, hematocrit 34.2, platelets 171.   Patient was admitted to the hospital with working diagnosis of incarcerated/ strangulated umbilical hernia.  Patient underwent reduction and repair of extremity and hernia 10/09.    Assessment & Plan:   Active Problems:   Strangulated umbilical hernia   Lactic acid acidosis   1. Strangulated umbilical hernia. Patient sp hernia repair, will continue to follow surgical recommendations, patient with NG tube in place, able to tolerate ice chips well. No significant abdominal pain, positive flatus. No nausea or vomiting. Continue famotidine. IV morphine as needed.   2. Alcoholic liver cirrhosis. No clinical encephalopathy, inr is 1,51, No clinical signs of ascites. Will  continue close monitoring. Avoid hepatotoxic medications. Ct if the abdomen with liver cirrhosis with signs of portal hypertension. Patient had varices banding in the past.   3. Alcohol and tobacco abuse. Will continue smoking cessation, counseling. No signs of active withdrawal.   4. COPD. Stable with no signs of exacerbation, will continue oxymetry monitoring and supplemental 02 per [place] as needed.    DVT prophylaxis: scd  Code Status: full Family Communication:  Disposition Plan: home   Consultants:   Surgery  Procedures:  REDUCTION AND REPAIR OF STRANGULATED UMBILICAL HERNIA  Antimicrobials:       Subjective: Patient feeling much better, mild abdominal pain and distention, no nausea or vomiting, positive flatus and tolerating well po. Patient asking about when can ng tube be removed.   Objective: Vitals:   07/07/17 1431 07/07/17 2046 07/08/17 0442 07/08/17 1100  BP: 107/66 99/62 101/60 112/74  Pulse: 61 (!) 54 (!) 55 65  Resp: Temp: 97.8 F (36.6 C) (!) 97.5 F (36.4 C) 97.8 F (36.6 C)   TempSrc: Oral Oral Oral   SpO2: 100% 100% 99% 99%  Weight:      Height:        Intake/Output Summary (Last 24 hours) at 07/08/17 1329 Last data filed at 07/08/17 0800  Gross per 24 hour  Intake                0 ml  Output             1650 ml  Net            -1650 ml   Filed Weights   07/06/17 1900 07/07/17 0500 07/07/17 1158  Weight: 86.3 kg (190 lb 4.1 oz) 87.4 kg (192 lb 10.9 oz) 87.5 kg (193 lb)    Examination:  General: deconditioned Neurology: Awake and alert, non focal  E ENT: no pallor, no icterus, oral mucosa moist. NG tube in place.  Cardiovascular: No JVD. S1-S2 present, rhythmic, no gallops, rubs, or murmurs. No lower extremity edema. Pulmonary: vesicular breath sounds bilaterally, adequate air movement, no wheezing, rhonchi or rales. Gastrointestinal. Abdomen distended, no organomegaly, mild tender at the surgical site, no rebound or guarding.  No drains in place.  Skin. No rashes Musculoskeletal: no joint deformities     Data Reviewed: I have personally reviewed following labs and imaging studies  CBC:  Recent Labs Lab 07/06/17 1454 07/07/17 0416  WBC 11.8* 10.8*  HGB 15.6 13.6  HCT 44.2 40.3  MCV 88.9 90.2  PLT 171 122*   Basic Metabolic Panel:  Recent Labs Lab 07/06/17 1454 07/07/17 0416  NA 134* 136  K 4.1 4.7  CL 96* 105  CO2 20* 20*  GLUCOSE 151* 145*  BUN 14 12  CREATININE 0.89 0.77  CALCIUM 9.9 8.9  MG  --  1.9  PHOS  --  3.5   GFR: Estimated Creatinine Clearance: 120.1 mL/min (by C-G formula based on SCr of 0.77 mg/dL). Liver Function Tests:  Recent Labs Lab 07/06/17 1454  AST 52*  ALT 30  ALKPHOS 129*  BILITOT 2.3*  PROT 8.4*  ALBUMIN 4.9    Recent Labs Lab 07/06/17 1454  LIPASE 45   No results for input(s): AMMONIA in the last 168 hours. Coagulation Profile:  Recent Labs Lab 07/06/17 2249  INR 1.51   Cardiac Enzymes: No results for input(s): CKTOTAL, CKMB, CKMBINDEX, TROPONINI in the last 168 hours. BNP (last 3 results) No results for input(s): PROBNP in the last 8760 hours. HbA1C: No results for input(s): HGBA1C in the last 72 hours. CBG:  Recent Labs Lab 07/06/17 1949 07/06/17 2357 07/07/17 0350  GLUCAP 104* 123* 142*   Lipid Profile: No results for input(s): CHOL, HDL, LDLCALC, TRIG, CHOLHDL, LDLDIRECT in the last 72 hours. Thyroid Function Tests: No results for input(s): TSH, T4TOTAL, FREET4, T3FREE, THYROIDAB in the last 72 hours. Anemia Panel: No results for input(s): VITAMINB12, FOLATE, FERRITIN, TIBC, IRON, RETICCTPCT in the last 72 hours.    Radiology Studies: I have reviewed all of the imaging during this hospital visit personally     Scheduled Meds: . nicotine  7 mg Transdermal Daily  . thiamine injection  100 mg Intravenous Daily   Continuous Infusions: . sodium chloride 100 mL/hr at 07/08/17 0538  . famotidine (PEPCID) IV Stopped  (07/07/17 2243)  . lactated ringers 10 mL/hr at 07/07/17 0700  . methocarbamol (ROBAXIN)  IV 500 mg (07/08/17 1226)     LOS: 2 days        Mauricio Annett Gula, MD [facility] Pager [phone]

## 2017-07-09 LAB — CBC WITH DIFFERENTIAL/PLATELET
BASOS ABS: 0.1 10*3/uL (ref 0.0–0.1)
Basophils Relative: 1 %
Eosinophils Absolute: 0.2 10*3/uL (ref 0.0–0.7)
Eosinophils Relative: 1 %
HEMATOCRIT: 40.9 % (ref 39.0–52.0)
HEMOGLOBIN: 13.9 g/dL (ref 13.0–17.0)
LYMPHS ABS: 2.7 10*3/uL (ref 0.7–4.0)
LYMPHS PCT: 25 %
MCH: 31.5 pg (ref 26.0–34.0)
MCHC: 34 g/dL (ref 30.0–36.0)
MCV: 92.7 fL (ref 78.0–100.0)
Monocytes Absolute: 0.8 10*3/uL (ref 0.1–1.0)
Monocytes Relative: 7 %
NEUTROS ABS: 7.3 10*3/uL (ref 1.7–7.7)
NEUTROS PCT: 66 %
Platelets: 127 10*3/uL — ABNORMAL LOW (ref 150–400)
RBC: 4.41 MIL/uL (ref 4.22–5.81)
RDW: 14.2 % (ref 11.5–15.5)
WBC: 11.1 10*3/uL — AB (ref 4.0–10.5)

## 2017-07-09 LAB — BASIC METABOLIC PANEL
ANION GAP: 10 (ref 5–15)
BUN: 14 mg/dL (ref 6–20)
CHLORIDE: 105 mmol/L (ref 101–111)
CO2: 22 mmol/L (ref 22–32)
Calcium: 8.8 mg/dL — ABNORMAL LOW (ref 8.9–10.3)
Creatinine, Ser: 0.86 mg/dL (ref 0.61–1.24)
GFR calc non Af Amer: >60 mL/min (ref >60)
Glucose, Bld: 80 mg/dL (ref 65–99)
POTASSIUM: 3.6 mmol/L (ref 3.5–5.1)
SODIUM: 137 mmol/L (ref 135–145)

## 2017-07-09 MED ORDER — LACTULOSE 10 GM/15ML PO SOLN
30.0000 g | Freq: Two times a day (BID) | ORAL | Status: DC | PRN
  Administered 2017-07-10: 30 g via ORAL
  Filled 2017-07-09: qty 45

## 2017-07-09 MED ORDER — OXYCODONE HCL 5 MG PO TABS
5.0000 mg | ORAL_TABLET | ORAL | Status: DC | PRN
  Administered 2017-07-09 – 2017-07-10 (×4): 10 mg via ORAL
  Filled 2017-07-09 (×4): qty 2

## 2017-07-09 MED ORDER — ENOXAPARIN SODIUM 40 MG/0.4ML ~~LOC~~ SOLN
40.0000 mg | SUBCUTANEOUS | Status: DC
  Administered 2017-07-11: 40 mg via SUBCUTANEOUS
  Filled 2017-07-09 (×2): qty 0.4

## 2017-07-09 MED ORDER — METHOCARBAMOL 750 MG PO TABS
750.0000 mg | ORAL_TABLET | Freq: Three times a day (TID) | ORAL | Status: DC | PRN
  Administered 2017-07-09 – 2017-07-10 (×2): 750 mg via ORAL
  Filled 2017-07-09 (×2): qty 1

## 2017-07-09 MED ORDER — POTASSIUM CHLORIDE 20 MEQ PO PACK
40.0000 meq | PACK | Freq: Once | ORAL | Status: AC
  Administered 2017-07-09: 40 meq via ORAL
  Filled 2017-07-09 (×2): qty 2

## 2017-07-09 MED ORDER — MORPHINE SULFATE (PF) 4 MG/ML IV SOLN: 2.0000 mg | INTRAVENOUS | Status: DC | PRN

## 2017-07-09 NOTE — Progress Notes (Signed)
Tech offered bath. Pt stated that he is going to talk with RN about a shower

## 2017-07-09 NOTE — Progress Notes (Signed)
PROGRESS NOTE    Joseph Cortez  JYN:[id number] DOB: 01/12/1966 DOA: 07/06/2017 PCP: Eustace Moore, MD    Brief Narrative:  51 year old male who presented with incarcerated/strangulated umbilical hernia. Patient does have significant past medical history of alcoholic cirrhosis, tobacco abuse, and COPD. Patient presents to the [facility] emergency department with severe periumbilical abdominal pain, associated with distention, discoloration, tenderness, nausea and vomiting. CT of the abdomen show high-grade small bowel obstruction due to umbilical hernia containing small bowel. Patient was transferred to [facility] cone due to high risk surgical candidate. On initial physical examination his blood pressure 133/87, heart rate 69, temperature 98.2, respiratory 20, oxygen saturation 99%. Moist mucous membranes, lungs were clear to auscultation bilaterally, no wheezing rales or rhonchi heart S1 S2 present, rhythmic, no gallops, rubs or murmurs, the abdomen was distended, nonreducible umbilical hernia with cyanotic discoloration, no lower extremity edema. Sodium 134, potassium 4.1, chloride 96, bicarbonate 20, glucose 151, BUN 14, creatinine 0.89, venous lactic acid 3.5, white count 11.8 hemoglobin 15.6, hematocrit 34.2, platelets 171.   Patient was admitted to the hospital with working diagnosis of incarcerated/ strangulated umbilical hernia.  Patient underwent reduction and repair of extremity and hernia 10/09.    Assessment & Plan:   Active Problems:   Strangulated umbilical hernia   Lactic acid acidosis   1. Strangulated umbilical hernia, sp hernia repair. NG tube has been removed, and diet has been advanced with good toleration. Will Continue famotidine. IV morphine as needed. Follow surgery recommendations. Will replete K with po kcl, follow on renal panel in am.   2. Alcoholic liver cirrhosis, Ct if the abdomen with liver cirrhosis with signs of portal hypertension. Avoid hepatotoxic  medications, no signs of decompensation. Avoid further alcohol consumption. Hepatitis B and C negative.   3. Alcohol and tobacco abuse. Smoking cessation. Continue to have no signs of active withdrawal.   4. COPD. No signs of exacerbation, on oxymetry monitoring and supplemental 02 per [place] as needed.    DVT prophylaxis: scd  Code Status: full Family Communication:  Disposition Plan: home   Consultants:   Surgery  Procedures:  REDUCTION AND REPAIR OF STRANGULATED UMBILICAL HERNIA  Antimicrobials:       Subjective: Patient feeling better, no nausea or vomiting, ng tube has been removed, positive flatus, but no bowel movement. No dyspnea or chest pain.   Objective: Vitals:   07/08/17 1100 07/08/17 1524 07/08/17 2121 07/09/17 0527  BP: 112/74 116/72 115/70 97/75  Pulse: 65 66 68 69  Resp:  Temp:  98.2 F (36.8 C) 98.3 F (36.8 C) 98.2 F (36.8 C)  TempSrc:  Oral Oral Oral  SpO2: 99% 98% 98% 98%  Weight:  87.4 kg (192 lb 10.9 oz)    Height:        Intake/Output Summary (Last 24 hours) at 07/09/17 1102 Last data filed at 07/09/17 0634  Gross per 24 hour  Intake           2532.5 ml  Output              300 ml  Net           2232.5 ml   Filed Weights   07/07/17 0500 07/07/17 1158 07/08/17 1524  Weight: 87.4 kg (192 lb 10.9 oz) 87.5 kg (193 lb) 87.4 kg (192 lb 10.9 oz)    Examination:  General: Not in pain or dyspnea.  Neurology: Awake and alert, non focal  E ENT: no pallor, no  icterus, oral mucosa moist Cardiovascular: No JVD. S1-S2 present, rhythmic, no gallops, rubs, or murmurs. No lower extremity edema. Pulmonary: vesicular breath sounds bilaterally, adequate air movement, no wheezing, rhonchi or rales. Gastrointestinal. Abdomen distended and timpanic, no organomegaly, non tender, no rebound or guarding Skin. No rashes Musculoskeletal: no joint deformities     Data Reviewed: I have personally reviewed following labs and imaging  studies  CBC:  Recent Labs Lab 07/06/17 1454 07/07/17 0416 07/09/17 0509  WBC 11.8* 10.8* 11.1*  NEUTROABS  --   --  7.3  HGB 15.6 13.6 13.9  HCT 44.2 40.3 40.9  MCV 88.9 90.2 92.7  PLT 171 122* 127*   Basic Metabolic Panel:  Recent Labs Lab 07/06/17 1454 07/07/17 0416 07/09/17 0509  NA 134* 136 137  K 4.1 4.7 3.6  CL 96* 105 105  CO2 20* 20* 22  GLUCOSE 151* 145* 80  BUN CREATININE 0.89 0.77 0.86  CALCIUM 9.9 8.9 8.8*  MG  --  1.9  --   PHOS  --  3.5  --    GFR: Estimated Creatinine Clearance: 111.7 mL/min (by C-G formula based on SCr of 0.86 mg/dL). Liver Function Tests:  Recent Labs Lab 07/06/17 1454  AST 52*  ALT 30  ALKPHOS 129*  BILITOT 2.3*  PROT 8.4*  ALBUMIN 4.9    Recent Labs Lab 07/06/17 1454  LIPASE 45   No results for input(s): AMMONIA in the last 168 hours. Coagulation Profile:  Recent Labs Lab 07/06/17 2249  INR 1.51   Cardiac Enzymes: No results for input(s): CKTOTAL, CKMB, CKMBINDEX, TROPONINI in the last 168 hours. BNP (last 3 results) No results for input(s): PROBNP in the last 8760 hours. HbA1C: No results for input(s): HGBA1C in the last 72 hours. CBG:  Recent Labs Lab 07/06/17 1949 07/06/17 2357 07/07/17 0350  GLUCAP 104* 123* 142*   Lipid Profile: No results for input(s): CHOL, HDL, LDLCALC, TRIG, CHOLHDL, LDLDIRECT in the last 72 hours. Thyroid Function Tests: No results for input(s): TSH, T4TOTAL, FREET4, T3FREE, THYROIDAB in the last 72 hours. Anemia Panel: No results for input(s): VITAMINB12, FOLATE, FERRITIN, TIBC, IRON, RETICCTPCT in the last 72 hours.    Radiology Studies: I have reviewed all of the imaging during this hospital visit personally     Scheduled Meds: . enoxaparin (LOVENOX) injection  40 mg Subcutaneous Q24H  . nicotine  7 mg Transdermal Daily  . thiamine injection  100 mg Intravenous Daily   Continuous Infusions: . sodium chloride 75 mL/hr at 07/09/17 0946  .  famotidine (PEPCID) IV Stopped (07/09/17 1100)  . lactated ringers 10 mL/hr at 07/07/17 0700     LOS: 3 days        Mauricio Annett Gula, MD [facility] Pager [phone]

## 2017-07-09 NOTE — Progress Notes (Signed)
[facility] Progress Note  3 Days Post-Op  Subjective: CC: inguinal hernia Patient states that his pain from his R inguinal hernia is bothering him more than abdominal pain. Tolerating sips of clears, passing flatus. Denies n/v. Ambulating more. Concerned about when he should restart lactulose.   Objective: Vital signs in last 24 hours: Temp:  [98.2 F (36.8 C)-98.3 F (36.8 C)] 98.2 F (36.8 C) (10/12 0527) Pulse Rate:  [65-69] 69 (10/12 0527) Resp:  [15-20] 20 (10/12 0527) BP: (97-116)/(70-75) 97/75 (10/12 0527) SpO2:  [98 %-99 %] 98 % (10/12 0527) Weight:  [87.4 kg (192 lb 10.9 oz)] 87.4 kg (192 lb 10.9 oz) (10/11 1524) Last BM Date: 07/06/17  Intake/Output from previous day: 10/11 0701 - 10/12 0700 In: 2592.5 [P.O.:360; I.V.:1967.5; IV Piggyback:265] Out: 550 [Urine:550] Intake/Output this shift: No intake/output data recorded.  PE: Gen:  Alert, NAD, pleasant Card:  Regular rate and rhythm, pedal pulses 2+ BL Pulm:  Normal effort, clear to auscultation bilaterally Abd: Soft, non- tender, non-distended, bowel sounds hypoactive, no HSM, incisions C/D/I GU: right inguinal hernia without erythema or signs of incarceration/strangulation Skin: warm and dry, no rashes  Psych: A&Ox3   Lab Results:   Recent Labs  07/07/17 0416 07/09/17 0509  WBC 10.8* 11.1*  HGB 13.6 13.9  HCT 40.3 40.9  PLT 122* 127*   BMET  Recent Labs  07/07/17 0416 07/09/17 0509  NA 136 137  K 4.7 3.6  CL 105 105  CO2 20* 22  GLUCOSE 145* 80  BUN 12 14  CREATININE 0.77 0.86  CALCIUM 8.9 8.8*   PT/INR  Recent Labs  07/06/17 2249  LABPROT 18.0*  INR 1.51   CMP     Component Value Date/Time   NA 137 07/09/2017 0509   K 3.6 07/09/2017 0509   CL 105 07/09/2017 0509   CO2 22 07/09/2017 0509   GLUCOSE 80 07/09/2017 0509   BUN 14 07/09/2017 0509   CREATININE 0.86 07/09/2017 0509   CREATININE 0.80 01/06/2017 1159   CALCIUM 8.8 (L) 07/09/2017 0509   PROT 8.4 (H)  07/06/2017 1454   ALBUMIN 4.9 07/06/2017 1454   AST 52 (H) 07/06/2017 1454   ALT 30 07/06/2017 1454   ALKPHOS 129 (H) 07/06/2017 1454   BILITOT 2.3 (H) 07/06/2017 1454   GFRNONAA >60 07/09/2017 0509   GFRNONAA >89 01/06/2017 1159   GFRAA >60 07/09/2017 0509   GFRAA >89 01/06/2017 1159   Lipase     Component Value Date/Time   LIPASE 45 07/06/2017 1454    Anti-infectives: Anti-infectives    Start     Dose/Rate Route Frequency Ordered Stop   07/06/17 1645  ertapenem (INVANZ) 1 g in sodium chloride 0.9 % 50 mL IVPB     1 g 100 mL/hr over 30 Minutes Intravenous  Once 07/06/17 1634 07/06/17 1734       Assessment/Plan Compensated cirrhosis related to Hx of alcohol abuse Hx of COPD  Incarcerated umbilical hernia  S/P open reduction and repair of incarcerated umbilical hernia 07/06/17 Dr. Sheliah Hatch - POD#3 - NGT removed yesterday evening - passing flatus, tolerating sips clears - start FLD - encourage ambulation - encourage PO pain control  Leukocytosis- WBC 11.1, afebrile  FEN: IVF, FLD VTE: SCDs, ok to start lovenox ID: Invanz periop  Plan: Fulls. Mobilize. PO pain control.   LOS: 3 days     Wells Guiles , [facility] 07/09/2017, 8:24 AM Pager: [phone] Consults: [phone] Mon-Fri 7:00 am-4:30 pm Sat-Sun 7:00  am-11:30 am

## 2017-07-10 DIAGNOSIS — G9340 Encephalopathy, unspecified: Secondary | ICD-10-CM

## 2017-07-10 DIAGNOSIS — F1010 Alcohol abuse, uncomplicated: Secondary | ICD-10-CM

## 2017-07-10 LAB — BASIC METABOLIC PANEL
Anion gap: 6 (ref 5–15)
BUN: 10 mg/dL (ref 6–20)
CALCIUM: 8.2 mg/dL — AB (ref 8.9–10.3)
CHLORIDE: 103 mmol/L (ref 101–111)
CO2: 23 mmol/L (ref 22–32)
CREATININE: 0.75 mg/dL (ref 0.61–1.24)
GFR calc Af Amer: >60 mL/min (ref >60)
Glucose, Bld: 84 mg/dL (ref 65–99)
Potassium: 3.8 mmol/L (ref 3.5–5.1)
Sodium: 132 mmol/L — ABNORMAL LOW (ref 135–145)

## 2017-07-10 MED ORDER — AMITRIPTYLINE HCL 25 MG PO TABS
25.0000 mg | ORAL_TABLET | Freq: Every day | ORAL | Status: DC
  Administered 2017-07-10: 25 mg via ORAL
  Filled 2017-07-10: qty 1

## 2017-07-10 MED ORDER — OXYCODONE HCL 5 MG PO TABS: 5.0000 mg | ORAL_TABLET | ORAL | 0 refills | Status: DC | PRN

## 2017-07-10 MED ORDER — POTASSIUM CHLORIDE 20 MEQ PO PACK
20.0000 meq | PACK | Freq: Once | ORAL | Status: AC
  Administered 2017-07-10: 20 meq via ORAL
  Filled 2017-07-10 (×2): qty 1

## 2017-07-10 MED ORDER — OXYCODONE HCL 5 MG PO TABS
5.0000 mg | ORAL_TABLET | Freq: Four times a day (QID) | ORAL | Status: DC | PRN
Start: 2017-07-10 — End: 2017-07-11
  Administered 2017-07-10 – 2017-07-11 (×4): 5 mg via ORAL
  Filled 2017-07-10 (×4): qty 1

## 2017-07-10 MED ORDER — METHOCARBAMOL 500 MG PO TABS
500.0000 mg | ORAL_TABLET | Freq: Three times a day (TID) | ORAL | Status: DC | PRN
  Administered 2017-07-10: 500 mg via ORAL
  Filled 2017-07-10: qty 1

## 2017-07-10 MED ORDER — ACETAMINOPHEN 325 MG PO TABS: 650.0000 mg | ORAL_TABLET | Freq: Four times a day (QID) | ORAL | Status: DC | PRN

## 2017-07-10 MED ORDER — GABAPENTIN 600 MG PO TABS
300.0000 mg | ORAL_TABLET | Freq: Three times a day (TID) | ORAL | Status: DC
  Administered 2017-07-10 – 2017-07-11 (×3): 300 mg via ORAL
  Filled 2017-07-10 (×3): qty 1

## 2017-07-10 MED ORDER — BISACODYL 5 MG PO TBEC
10.0000 mg | DELAYED_RELEASE_TABLET | Freq: Once | ORAL | Status: DC
  Filled 2017-07-10: qty 2

## 2017-07-10 MED ORDER — FAMOTIDINE 20 MG PO TABS
20.0000 mg | ORAL_TABLET | Freq: Two times a day (BID) | ORAL | Status: DC
  Administered 2017-07-10 – 2017-07-11 (×2): 20 mg via ORAL
  Filled 2017-07-10 (×2): qty 1

## 2017-07-10 NOTE — Progress Notes (Signed)
4 Days Post-Op   Subjective/Chief Complaint: Tolerating diet, no n/v, having flatus, up and around   Objective: Vital signs in last 24 hours: Temp:  [97.9 F (36.6 C)-98.1 F (36.7 C)] 98.1 F (36.7 C) (10/13 0610) Pulse Rate:  [66-80] 69 (10/13 0610) Resp:  [18-20] 18 (10/13 0610) BP: (96-107)/(60-68) 96/67 (10/13 0610) SpO2:  [98 %-99 %] 99 % (10/13 0610) Weight:  [91.3 kg (201 lb 4.8 oz)] 91.3 kg (201 lb 4.8 oz) (10/13 0610) Last BM Date: 07/06/17  Intake/Output from previous day: 10/12 0701 - 10/13 0700 In: 770 [P.O.:720; IV Piggyback:50] Out: -  Intake/Output this shift: No intake/output data recorded.  General appearance: no distress GI: right groin hernia unchanged, umbilical incision without drainage, approp tender soft bs present  Lab Results:   Recent Labs  07/09/17 0509  WBC 11.1*  HGB 13.9  HCT 40.9  PLT 127*   BMET  Recent Labs  07/09/17 0509 07/10/17 0545  NA 137 132*  K 3.6 3.8  CL 105 103  CO2 22 23  GLUCOSE 80 84  BUN 14 10  CREATININE 0.86 0.75  CALCIUM 8.8* 8.2*    Anti-infectives: Anti-infectives    Start     Dose/Rate Route Frequency Ordered Stop   07/06/17 1645  ertapenem (INVANZ) 1 g in sodium chloride 0.9 % 50 mL IVPB     1 g 100 mL/hr over 30 Minutes Intravenous  Once 07/06/17 1634 07/06/17 1734      Assessment/Plan:  POD 4 S/P open reduction and repair of incarcerated umbilical hernia 07/06/17 Dr. Sheliah Hatch - regular diet - encourage ambulation - encourage PO pain control  FEN: regular diet VTE: SCDs, ok to start lovenox Can dc home when ok medically  Joseph Cortez 07/10/2017

## 2017-07-10 NOTE — Progress Notes (Signed)
Notified MD patient requesting robaxin tonight.

## 2017-07-10 NOTE — Progress Notes (Signed)
PROGRESS NOTE    Joseph Cortez  UJW:[id number] DOB: 03-11-1966 DOA: 07/06/2017 PCP: Eustace Moore, MD    Brief Narrative:  51 year old male who presented with incarcerated/strangulated umbilical hernia. Patient does have significant past medical history of alcoholic cirrhosis, tobacco abuse, and COPD. Patient presents to the [facility] emergency department with severe periumbilical abdominal pain, associated with distention, discoloration, tenderness, nausea and vomiting. CT of the abdomen show high-grade small bowel obstruction due to umbilical hernia containing small bowel. Patient was transferred to [facility] due to high risk surgical candidate. On initial physical examination his blood pressure 133/87, heart rate 69, temperature 98.2, respiratory 20, oxygen saturation 99%. Moist mucous membranes, lungs were clear to auscultation bilaterally, no wheezing rales or rhonchi heart S1 S2 present, rhythmic, no gallops, rubs or murmurs, the abdomen was distended, nonreducible umbilical hernia with cyanotic discoloration, no lower extremity edema. Sodium 134, potassium 4.1, chloride 96, bicarbonate 20, glucose 151, BUN 14, creatinine 0.89, venous lactic acid 3.5, white count 11.8 hemoglobin 15.6, hematocrit 34.2, platelets 171.   Patient was admitted to the hospital with working diagnosis of incarcerated/ strangulated umbilical hernia.  Patient underwent reduction and repair of extremity and hernia 10/09.    Assessment & Plan:   Active Problems:   Strangulated umbilical hernia   Lactic acid acidosis  1. Strangulated umbilical hernia, sp hernia repair. Patient recovering well, diet has been advanced, no bowel movement yet, no nausea or vomiting. Will continue famotidine. Ambulate in the hallway, out of bed to the chair and physical therapy evaluation. One dose of dulcolax 10 mg today.   2. Encephalopathy toxic (New). Patient very somnolent this am, non focal and no asterixis, will hold on  all IV narcotics, and will continue pain control with acetaminophen and oxycodone (low dose) for now.   2. Alcoholic liver cirrhosis, Ct if the abdomen with liver cirrhosis with signs of portal hypertension. No signs of decompensation. Avoid further alcohol consumption. Hepatitis B and C negative. Will continue to hold on diuretic therapy for now.   3. Alcohol and tobacco abuse. Continue smoking cessation.  4. COPD. With no signs of exacerbation.   5. Depression. Will resume amitriptyline.   6. Hypokalemia. Continue K repletion with po kcl, will follow on renal panel in am. Serum cr at 0.75 with K at 3,8 and serum bicarbonate at 23.    DVT prophylaxis:scd Code Status:full Family Communication: Disposition Plan:home   Consultants:  Surgery  Procedures: REDUCTION AND REPAIR OF STRANGULATED UMBILICAL HERNIA  Antimicrobials:   Subjective: Patient feeling very weak and deconditioned, worsening abdominal distention, no nausea or vomiting, positive flatus but no bowel movement. Resumed regular diet last night.   Objective: Vitals:   07/09/17 0527 07/09/17 1300 07/09/17 2222 07/10/17 0610  BP: 97/75 107/68 98/60 96/67   Pulse: 69 80 66 69  Resp: Temp: 98.2 F (36.8 C) 98.1 F (36.7 C) 97.9 F (36.6 C) 98.1 F (36.7 C)  TempSrc: Oral Oral Oral Oral  SpO2: 98% 98% 99% 99%  Weight:    91.3 kg (201 lb 4.8 oz)  Height:        Intake/Output Summary (Last 24 hours) at 07/10/17 1340 Last data filed at 07/10/17 0612  Gross per 24 hour  Intake              770 ml  Output                0 ml  Net  770 ml   Filed Weights   07/07/17 1158 07/08/17 1524 07/10/17 0610  Weight: 87.5 kg (193 lb) 87.4 kg (192 lb 10.9 oz) 91.3 kg (201 lb 4.8 oz)    Examination:   General: Not in pain or dyspnea, deconditioned Neurology: somnolent but easy to arouse, non focal  E ENT: mid pallor, no icterus, oral mucosa moist Cardiovascular: No JVD. S1-S2  present, rhythmic, no gallops, rubs, or murmurs. No lower extremity edema. Pulmonary: vesicular breath sounds bilaterally, adequate air movement, no wheezing, rhonchi or rales. Gastrointestinal. Abdomen mild distended, no organomegaly, non tender, no rebound or guarding. Not tympanic, bowel sounds present.  Skin. No rashes Musculoskeletal: no joint deformities     Data Reviewed: I have personally reviewed following labs and imaging studies  CBC:  Recent Labs Lab 07/06/17 1454 07/07/17 0416 07/09/17 0509  WBC 11.8* 10.8* 11.1*  NEUTROABS  --   --  7.3  HGB 15.6 13.6 13.9  HCT 44.2 40.3 40.9  MCV 88.9 90.2 92.7  PLT 171 122* 127*   Basic Metabolic Panel:  Recent Labs Lab 07/06/17 1454 07/07/17 0416 07/09/17 0509 07/10/17 0545  NA 134* 136 137 132*  K 4.1 4.7 3.6 3.8  CL 96* 105 105 103  CO2 20* 20* 22 23  GLUCOSE 151* 145* 80 84  BUN CREATININE 0.89 0.77 0.86 0.75  CALCIUM 9.9 8.9 8.8* 8.2*  MG  --  1.9  --   --   PHOS  --  3.5  --   --    GFR: Estimated Creatinine Clearance: 122.6 mL/min (by C-G formula based on SCr of 0.75 mg/dL). Liver Function Tests:  Recent Labs Lab 07/06/17 1454  AST 52*  ALT 30  ALKPHOS 129*  BILITOT 2.3*  PROT 8.4*  ALBUMIN 4.9    Recent Labs Lab 07/06/17 1454  LIPASE 45   No results for input(s): AMMONIA in the last 168 hours. Coagulation Profile:  Recent Labs Lab 07/06/17 2249  INR 1.51   Cardiac Enzymes: No results for input(s): CKTOTAL, CKMB, CKMBINDEX, TROPONINI in the last 168 hours. BNP (last 3 results) No results for input(s): PROBNP in the last 8760 hours. HbA1C: No results for input(s): HGBA1C in the last 72 hours. CBG:  Recent Labs Lab 07/06/17 1949 07/06/17 2357 07/07/17 0350  GLUCAP 104* 123* 142*   Lipid Profile: No results for input(s): CHOL, HDL, LDLCALC, TRIG, CHOLHDL, LDLDIRECT in the last 72 hours. Thyroid Function Tests: No results for input(s): TSH, T4TOTAL, FREET4,  T3FREE, THYROIDAB in the last 72 hours. Anemia Panel: No results for input(s): VITAMINB12, FOLATE, FERRITIN, TIBC, IRON, RETICCTPCT in the last 72 hours.    Radiology Studies: I have reviewed all of the imaging during this hospital visit personally     Scheduled Meds: . enoxaparin (LOVENOX) injection  40 mg Subcutaneous Q24H  . nicotine  7 mg Transdermal Daily  . thiamine injection  100 mg Intravenous Daily   Continuous Infusions: . famotidine (PEPCID) IV Stopped (07/10/17 1045)  . lactated ringers 10 mL/hr at 07/07/17 0700     LOS: 4 days        Estefanny Moler Annett Gula, MD [facility] Pager [phone]

## 2017-07-11 DIAGNOSIS — J449 Chronic obstructive pulmonary disease, unspecified: Secondary | ICD-10-CM

## 2017-07-11 MED ORDER — VITAMIN B-1 100 MG PO TABS: 100.0000 mg | ORAL_TABLET | Freq: Every day | ORAL | Status: DC

## 2017-07-11 NOTE — Progress Notes (Signed)
5 Days Post-Op   Subjective/Chief Complaint: No complaints, passing flatus and having bms, tol diet    Objective: Vital signs in last 24 hours: Temp:  [97.6 F (36.4 C)-98.7 F (37.1 C)] 98.3 F (36.8 C) (10/14 0442) Pulse Rate:  [70-75] 75 (10/14 0442) Resp:  [18] 18 (10/14 0442) BP: (104-116)/(65-77) 104/65 (10/14 0442) SpO2:  [97 %-100 %] 99 % (10/14 0442) Last BM Date: 07/10/17  Intake/Output from previous day: No intake/output data recorded. Intake/Output this shift: No intake/output data recorded.  GI: soft nt mild distended bs present wound clean  Lab Results:   Recent Labs  07/09/17 0509  WBC 11.1*  HGB 13.9  HCT 40.9  PLT 127*   BMET  Recent Labs  07/09/17 0509 07/10/17 0545  NA 137 132*  K 3.6 3.8  CL 105 103  CO2 22 23  GLUCOSE 80 84  BUN 14 10  CREATININE 0.86 0.75  CALCIUM 8.8* 8.2*   Anti-infectives: Anti-infectives    Start     Dose/Rate Route Frequency Ordered Stop   07/06/17 1645  ertapenem (INVANZ) 1 g in sodium chloride 0.9 % 50 mL IVPB     1 g 100 mL/hr over 30 Minutes Intravenous  Once 07/06/17 1634 07/06/17 1734      Assessment/Plan: POD 5 S/P open reduction and repair of incarcerated umbilical hernia 07/06/17 Dr. Sheliah Hatch - regular diet - encourage ambulation - encourage PO pain control  FEN: regular diet VTE: SCDs, lovenox Can dc home when ok medically  [facility] 07/11/2017

## 2017-07-11 NOTE — Discharge Summary (Signed)
Physician Discharge Summary  Nero Sawatzky ZOX:[id number] DOB: 07-10-1966 DOA: 07/06/2017  PCP: Eustace Moore, MD  Admit date: 07/06/2017 Discharge date: 07/11/2017  Admitted From: Home Disposition:  Home  Recommendations for Outpatient Follow-up:  1. Follow up with PCP in 1- weeks 2. Patient will resume diuretic therapy   Home Health: No  Equipment/Devices: No   Discharge Condition: Stable CODE STATUS: Full  Diet recommendation:  Cardiac prudent  Brief/Interim Summary: 51 year old male who presented with incarcerated/strangulated umbilical hernia. Patient does have significant past medical history of alcoholic cirrhosis, tobacco abuse, and COPD. Patient presents to the [facility] emergency department with severe periumbilical abdominal pain, associated with distention, discoloration, tenderness, nausea and vomiting. CT of the abdomen show high-grade small bowel obstruction due to umbilical hernia containing small bowel. Patient was transferred to [facility] due to high risk surgical candidate. On initial physical examination his blood pressure 133/87, heart rate 69, temperature 98.2, respiratory 20, oxygen saturation 99%. Moist mucous membranes, lungs were clear to auscultation bilaterally, no wheezing rales or rhonchi heart S1 S2 present, rhythmic, no gallops, rubs or murmurs, the abdomen was distended, nonreducible umbilical hernia with cyanotic discoloration, no lower extremity edema. Sodium 134, potassium 4.1, chloride 96, bicarbonate 20, glucose 151, BUN 14, creatinine 0.89, venous lactic acid 3.5, white count 11.8 hemoglobin 15.6, hematocrit 34.2, platelets 171.   Patient was admitted to the hospital with working diagnosis of incarcerated/ strangulated umbilical hernia and underwent emergent reduction and repair abdominal hernia.   1. Strangulated umbilical hernia status post surgical repair. Patient was admitted to the hospital, he was placed on intravenous fluids, he was kept  nothing by mouth, he was emergently intervened surgically, with repair of abdominal hernia. He tolerated procedure well, was transferred to medical ward, where he was kept with a nasogastric tube, until recovery of his bowel function. His diet was progressively advanced, he was able to move his bowels with no complications, and he was deemed stable to discharge home.  2. Alcoholic liver cirrhosis, with portal hypertension. Patient did not show coagulopathy, he did have transient confusion and somnolence, likely related to medications. No frank asterixis or signs of portosystemic encephalopathy. At discharge he will resume his diuretic therapy, as well as his lactulose. Target 2-3 bowel movements per day.   3. COPD. Stable with no signs of exacerbation.  4. Tobacco and alcohol abuse. Patient was counseled about avoid, alcohol and tobacco. No signs of withdrawal noted during the hospitalization.   5. Depression. Patient was continued on amitriptyline.   Discharge Diagnoses:  Active Problems:   Strangulated umbilical hernia   Lactic acid acidosis    Discharge Instructions   Allergies as of 07/11/2017      Reactions   Sertraline Other (See Comments)   Suicidal ideations      Medication List    STOP taking these medications   promethazine 12.5 MG tablet Commonly known as:  PHENERGAN     TAKE these medications   acetaminophen 650 MG CR tablet Commonly known as:  TYLENOL Take 650 mg by mouth every 8 (eight) hours as needed for pain.   amitriptyline 25 MG tablet Commonly known as:  ELAVIL Take 1 tablet by mouth daily.   B COMPLETE PO Take 1 tablet by mouth daily.   bismuth subsalicylate 262 MG/15ML suspension Commonly known as:  PEPTO BISMOL Take 30 mLs by mouth every 6 (six) hours as needed (for diarrhea/loose stools or upset stomach.).   furosemide 80 MG tablet Commonly known as:  LASIX Take 1 tablet by mouth daily. What changed:  Another medication with the same name  was removed. Continue taking this medication, and follow the directions you see here.   gabapentin 300 MG capsule Commonly known as:  NEURONTIN Take 300-600 mg by mouth See admin instructions.  in the morning,  at 4 pm, and  at bedtime   lactulose 10 GM/15ML solution Commonly known as:  CHRONULAC TAKE 45 ML BY MOUTH 2 to 3 times daily   multivitamin with minerals Tabs tablet Take 1 tablet by mouth daily.   ondansetron 4 MG tablet Commonly known as:  ZOFRAN Take 1 tablet (4 mg total) by mouth every 8 (eight) hours as needed for nausea or vomiting.   oxyCODONE 5 MG immediate release tablet Commonly known as:  Oxy IR/ROXICODONE Take 1 tablet (5 mg total) by mouth every 4 (four) hours as needed for moderate pain or severe pain (5 mg for moderate; 10 mg for severe).   pantoprazole 40 MG tablet Commonly known as:  PROTONIX Take 1 tablet (40 mg total) by mouth 2 (two) times daily before a meal. What changed:  when to take this   spironolactone 100 MG tablet Commonly known as:  ALDACTONE Take 2 tablets by mouth daily.   VITAMIN B-1 PO Take 1 tablet by mouth daily.      Follow-up Information    Kinsinger, De Blanch, MD. Call.   Specialty:  General Surgery Why:  Call to confirm appointment date/time. Please arrive 30 min prior to appointment time. Bring photo ID and insurance information.  Contact information: [address] [place] [address] [phone]          Allergies  Allergen Reactions  . Sertraline Other (See Comments)    Suicidal ideations    Consultations:  Surgery   Procedures/Studies: Dg Abd 1 View  Result Date: 07/06/2017 CLINICAL DATA:  Encounter for nasogastric tube placement EXAM: ABDOMEN - 1 VIEW COMPARISON:  Portable exam 2310 hours compared to earlier CT exam of 07/06/2017 FINDINGS: Nasogastric tube is present with tip projecting over proximal gastric antrum. Visualized bowel gas pattern unremarkable. Small amount  of excreted contrast material within the renal collecting systems bilaterally. Lung bases clear. Surgical clips RIGHT upper quadrant question cholecystectomy. Bones demineralized. IMPRESSION: Tip of nasogastric tube projects over proximal gastric antrum. Electronically Signed   By: Ulyses Southward M.D.   On: 07/06/2017 23:44   Ct Abdomen Pelvis W Contrast  Result Date: 07/06/2017 CLINICAL DATA:  Abdominal pain with hernia suspected. EXAM: CT ABDOMEN AND PELVIS WITH CONTRAST TECHNIQUE: Multidetector CT imaging of the abdomen and pelvis was performed using the standard protocol following bolus administration of intravenous contrast. CONTRAST:  ISOVUE-300 IOPAMIDOL (ISOVUE-300) INJECTION 61% COMPARISON:  11/20/2016 FINDINGS: Lower chest:  Small layering right pleural effusion. Hepatobiliary: Cirrhosis with portal hypertension. There is recannulized periumbilical vein and small volume ascites. No discrete mass lesion is seen when allowing for heterogeneity of the parenchymal enhancement.Cholecystectomy. No common bile duct dilatation. Pancreas: Unremarkable. Spleen: Mild splenomegaly.  No focal abnormality. Adrenals/Urinary Tract: Negative adrenals. No hydronephrosis or stone. Unremarkable bladder. Stomach/Bowel: High-grade small bowel obstruction with transition point at a umbilical hernia which contains a knuckle of small bowel. Proximal jejunum is decompressed but a proximal transition point is not identified to suggest closed loop. The bowel wall is not thickened or nonenhancing. No perforation. No appendicitis. Vascular/Lymphatic: Portal hypertension with recannulized umbilical vein. Atherosclerotic calcification of the aorta. No acute finding. No mass or adenopathy.  Reproductive:No pathologic findings. Other: Umbilical hernia described above. There is also a large right indirect inguinal hernia containing loops of nonobstructed small bowel. Musculoskeletal: No acute or aggressive finding. IMPRESSION: 1.  High-grade small bowel obstruction due to umbilical hernia containing a knuckle of small bowel. No bowel necrosis or perforation identified. 2. Large right inguinal hernia containing multiple loops of nonobstructed small bowel. 3. Cirrhosis with portal hypertension.  Ascites is small volume. Electronically Signed   By: Marnee Spring M.D.   On: 07/06/2017 16:35       Subjective: Patient feeling well, no nausea or vomiting, mild abdominal pain, no chest pain or dyspnea.   Discharge Exam: Vitals:   07/10/17 2016 07/11/17 0442  BP: 116/77 104/65  Pulse: 72 75  Resp: 18 18  Temp: 97.6 F (36.4 C) 98.3 F (36.8 C)  SpO2: 100% 99%   Vitals:   07/10/17 0610 07/10/17 1300 07/10/17 2016 07/11/17 0442  BP: 96/67 108/69 116/77 104/65  Pulse: 69 70 72 75  Resp: Temp: 98.1 F (36.7 C) 98.7 F (37.1 C) 97.6 F (36.4 C) 98.3 F (36.8 C)  TempSrc: Oral Oral Oral Oral  SpO2: 99% 97% 100% 99%  Weight: 91.3 kg (201 lb 4.8 oz)     Height:        General: Pt is alert, awake, not in acute distress E ENT: no pallor on icterus.  Cardiovascular: RRR, S1/S2 +, no rubs, no gallops Respiratory: CTA bilaterally, no wheezing, no rhonchi Abdominal: Soft, NT, ND, bowel sounds + Extremities: no edema, no cyanosis    The results of significant diagnostics from this hospitalization (including imaging, microbiology, ancillary and laboratory) are listed below for reference.     Microbiology: Recent Results (from the past 240 hour(s))  MRSA PCR Screening     Status: None   Collection Time: 07/06/17  6:55 PM  Result Value Ref Range Status   MRSA by PCR NEGATIVE NEGATIVE Final    Comment:        The GeneXpert MRSA Assay (FDA approved for NASAL specimens only), is one component of a comprehensive MRSA colonization surveillance program. It is not intended to diagnose MRSA infection nor to guide or monitor treatment for MRSA infections.   Surgical PCR screen     Status: None    Collection Time: 07/06/17  7:46 PM  Result Value Ref Range Status   MRSA, PCR NEGATIVE NEGATIVE Final   Staphylococcus aureus NEGATIVE NEGATIVE Final    Comment: (NOTE) The Xpert SA Assay (FDA approved for NASAL specimens in patients 53 years of age and older), is one component of a comprehensive surveillance program. It is not intended to diagnose infection nor to guide or monitor treatment.      Labs: BNP (last 3 results)  Recent Labs  07/25/16 2037  BNP 62.0   Basic Metabolic Panel:  Recent Labs Lab 07/06/17 1454 07/07/17 0416 07/09/17 0509 07/10/17 0545  NA 134* 136 137 132*  K 4.1 4.7 3.6 3.8  CL 96* 105 105 103  CO2 20* 20* 22 23  GLUCOSE 151* 145* 80 84  BUN CREATININE 0.89 0.77 0.86 0.75  CALCIUM 9.9 8.9 8.8* 8.2*  MG  --  1.9  --   --   PHOS  --  3.5  --   --    Liver Function Tests:  Recent Labs Lab 07/06/17 1454  AST 52*  ALT 30  ALKPHOS 129*  BILITOT 2.3*  PROT 8.4*  ALBUMIN 4.9    Recent Labs Lab 07/06/17 1454  LIPASE 45   No results for input(s): AMMONIA in the last 168 hours. CBC:  Recent Labs Lab 07/06/17 1454 07/07/17 0416 07/09/17 0509  WBC 11.8* 10.8* 11.1*  NEUTROABS  --   --  7.3  HGB 15.6 13.6 13.9  HCT 44.2 40.3 40.9  MCV 88.9 90.2 92.7  PLT 171 122* 127*   Cardiac Enzymes: No results for input(s): CKTOTAL, CKMB, CKMBINDEX, TROPONINI in the last 168 hours. BNP: Invalid input(s): POCBNP CBG:  Recent Labs Lab 07/06/17 1949 07/06/17 2357 07/07/17 0350  GLUCAP 104* 123* 142*   D-Dimer No results for input(s): DDIMER in the last 72 hours. Hgb A1c No results for input(s): HGBA1C in the last 72 hours. Lipid Profile No results for input(s): CHOL, HDL, LDLCALC, TRIG, CHOLHDL, LDLDIRECT in the last 72 hours. Thyroid function studies No results for input(s): TSH, T4TOTAL, T3FREE, THYROIDAB in the last 72 hours.  Invalid input(s): FREET3 Anemia work up No results for input(s): VITAMINB12, FOLATE,  FERRITIN, TIBC, IRON, RETICCTPCT in the last 72 hours. Urinalysis    Component Value Date/Time   COLORURINE AMBER (A) 01/28/2017 1846   APPEARANCEUR CLEAR 01/28/2017 1846   LABSPEC 1.020 01/28/2017 1846   PHURINE 5.0 01/28/2017 1846   GLUCOSEU 50 (A) 01/28/2017 1846   HGBUR NEGATIVE 01/28/2017 1846   BILIRUBINUR SMALL (A) 01/28/2017 1846   KETONESUR NEGATIVE 01/28/2017 1846   PROTEINUR NEGATIVE 01/28/2017 1846   NITRITE NEGATIVE 01/28/2017 1846   LEUKOCYTESUR NEGATIVE 01/28/2017 1846   Sepsis Labs Invalid input(s): PROCALCITONIN,  WBC,  LACTICIDVEN Microbiology Recent Results (from the past 240 hour(s))  MRSA PCR Screening     Status: None   Collection Time: 07/06/17  6:55 PM  Result Value Ref Range Status   MRSA by PCR NEGATIVE NEGATIVE Final    Comment:        The GeneXpert MRSA Assay (FDA approved for NASAL specimens only), is one component of a comprehensive MRSA colonization surveillance program. It is not intended to diagnose MRSA infection nor to guide or monitor treatment for MRSA infections.   Surgical PCR screen     Status: None   Collection Time: 07/06/17  7:46 PM  Result Value Ref Range Status   MRSA, PCR NEGATIVE NEGATIVE Final   Staphylococcus aureus NEGATIVE NEGATIVE Final    Comment: (NOTE) The Xpert SA Assay (FDA approved for NASAL specimens in patients 49 years of age and older), is one component of a comprehensive surveillance program. It is not intended to diagnose infection nor to guide or monitor treatment.      Time coordinating discharge: 45 minutes  SIGNED:   Coralie Keens, MD  [facility] 07/11/2017, 10:28 AM Pager [phone]  If 7PM-7AM, please contact night-coverage [email or web address] Password [email or web address]

## 2017-07-12 MED FILL — oxyCODONE HCL 5 MG TABS: 5 | 2 days supply | Qty: 20 | Fill #0

## 2017-07-14 NOTE — Telephone Encounter (Signed)
Called [facility] to f/u on referral. Receptionist said he's not in their system. She asked what fax number his info was sent to. She said that it was the wrong fax number and gave another number. Records faxed to [phone].

## 2017-07-29 ENCOUNTER — Telehealth: Payer: Self-pay | Admitting: *Deleted

## 2017-07-29 NOTE — Telephone Encounter (Signed)
Spoke with [facility] to follow up on pain management referral. Was told that both Dr. Daily and Dr. Eden EmmsFryfield looked over patient records and they are not accepting this patient. Per receptionists, the note states they do not have anything to offer this patient. Sending to [place].

## 2017-07-30 NOTE — Telephone Encounter (Signed)
Noted  

## 2017-08-02 ENCOUNTER — Ambulatory Visit (INDEPENDENT_AMBULATORY_CARE_PROVIDER_SITE_OTHER): Payer: Medicaid - Out of State | Admitting: Gastroenterology

## 2017-08-02 ENCOUNTER — Encounter: Payer: Self-pay | Admitting: Gastroenterology

## 2017-08-02 VITALS — BP 106/66 | HR 79 | Temp 97.2°F | Ht 68.0 in | Wt 195.0 lb

## 2017-08-02 DIAGNOSIS — K7030 Alcoholic cirrhosis of liver without ascites: Secondary | ICD-10-CM

## 2017-08-02 NOTE — Progress Notes (Signed)
Primary Care Physician:  Eustace MooreNelson, Yvonne Sue, MD Primary GI: Dr. Jena Gaussourk   Chief Complaint  Patient presents with  . Cirrhosis    f/u.    HPI:   Joseph Cortez is a 51 y.o. male presenting today with a history of  ETOH cirrhosis, chronic need for LVAPs in past, history of variceal bleeding in early 2017 at an outside hospital, intolerant to non-selective beta-blocker therapy, and umbilical and right inguinal hernia. Unfortunately, he underwent emergent repair of incarcerated/strangulated umbilical hernia in October 2018. 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 Due for surveillance in Dec 2019. Colonoscopy on file from several months ago with diverticulosis in entire examined colon. Congested. Internal hemorrhoids.   [facility] has [facility] for [facility] of liver transplant candidacy. Next US due again in April 2019. Goes to [facility] tomorrow.   Aldactone 200 mg, Lasix 80 mg daily. Lactulose. BM 2-3 soft per day. No lower extremity edema or need for paracentesis since Sept 4, 2018. 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 Attends AA. Has a girlfriend now. Clinically, he is fairly well compensated at this time and excited about upcoming transplant candidacy evaluation.   Past Medical History:  Diagnosis Date  . AKI (acute kidney injury) (HCC) 07/14/2016  . Anemia   . Anxiety   . Arthritis   . Blood transfusion without reported diagnosis   . Cirrhosis (HCC)   . COPD (chronic obstructive pulmonary disease) (HCC)   . Depression   . Dyspnea   . Hypokalemia 07/14/2016  . Hyponatremia 07/14/2016  . Neuromuscular disorder (HCC)    Neuropathy  . Neuropathy   . Substance abuse (HCC)   . Ulcer    BLEEDING VARICES, ULCERATIONS    Past Surgical History:  Procedure Laterality Date  . CHOLECYSTECTOMY    . ESOPHAGOGASTRODUODENOSCOPY  January 06, 2016   [facility] duodenitis in bullb,  portal gastropathy, 2 columns of large distal esophageal varices, one with flat red spot, s/p band ligation. Needs 4 week surveillance EGD   NOTE: Patient  PSH incomplete in this note due to computer issues. See history tab for complete information. History tab information was reviewed with the patient and found to be correct.   Current Outpatient Medications  Medication Sig Dispense Refill  . acetaminophen (TYLENOL) 650 MG CR tablet Take 650 mg by mouth every 8 (eight) hours as needed for pain.    Marland Kitchen. amitriptyline (ELAVIL) 25 MG tablet Take 1 tablet as needed by mouth.   4  . B Complex-Biotin-FA (B COMPLETE PO) Take 1 tablet by mouth daily.    Marland Kitchen. bismuth subsalicylate (PEPTO BISMOL) 262 MG/15ML suspension Take 30 mLs by mouth every 6 (six) hours as needed (for diarrhea/loose stools or upset stomach.).     Marland Kitchen. furosemide (LASIX) 80 MG tablet Take 1 tablet by mouth daily.    Marland Kitchen. gabapentin (NEURONTIN) 300 MG capsule Take 300-600 mg by mouth See admin instructions. 300mg  in the morning, 300mg  at 4 pm, and 600mg  at bedtime    . lactulose (CHRONULAC) 10 GM/15ML solution TAKE 45 ML BY MOUTH 2 to 3 times daily 4000 mL 3  . Multiple Vitamin (MULTIVITAMIN WITH MINERALS) TABS tablet Take 1 tablet by mouth daily.    . ondansetron (ZOFRAN) 4 MG tablet Take 1 tablet (4 mg total) by mouth every 8 (eight) hours as needed for nausea or vomiting.  60 tablet 5  . pantoprazole (PROTONIX) 40 MG tablet Take 1 tablet (40 mg total) by mouth 2 (two) times daily before a meal. (Patient taking differently: Take 40 mg by mouth daily. ) 180 tablet 3  . spironolactone (ALDACTONE) 100 MG tablet Take 2 tablets by mouth daily.    . Thiamine HCl (VITAMIN B-1 PO) Take 1 tablet by mouth daily.     No current facility-administered medications for this visit.     Allergies as of 08/02/2017 - Review Complete 08/02/2017  Allergen Reaction Noted  . Sertraline Other (See Comments) 01/07/2016    Family History  Problem Relation Age of  Onset  . Colon cancer Maternal Uncle   . Alcohol abuse Maternal Uncle   . Arthritis Mother   . Cancer Mother        breast cancer  . Hypertension Father   . Hyperlipidemia Father   . Lupus Sister   . Alcohol abuse Paternal Uncle   . Alzheimer's disease Maternal Grandmother   . COPD Maternal Grandfather   . Diabetes Maternal Grandfather   . Heart disease Paternal Grandfather   . Alcohol abuse Cousin     Social History   Socioeconomic History  . Marital status: Divorced    Spouse name: None  . Number of children: 2  . Years of education: 22  . Highest education level: None  Social Needs  . Financial resource strain: None  . Food insecurity - worry: None  . Food insecurity - inability: None  . Transportation needs - medical: None  . Transportation needs - non-medical: None  Occupational History  . Occupation: disability    Comment: former truck Hospital doctor  Tobacco Use  . Smoking status: Current Every Day Smoker    Types: Cigarettes  . Smokeless tobacco: Never Used  . Tobacco comment: 3-4 cigarettes daily   Substance and Sexual Activity  . Alcohol use: No    Alcohol/week: 0.0 oz    Frequency: Never    Comment: May 2018 sober   . Drug use: Yes    Types: Opium    Comment: cocaine historically, several years ago  . Sexual activity: Yes    Birth control/protection: None  Other Topics Concern  . None  Social History Narrative   Lives alone   Lives near sister   No reg exercise   Disabled for 3 years    Review of Systems: Gen: Denies fever, chills, anorexia. Denies fatigue, weakness, weight loss.  CV: Denies chest pain, palpitations, syncope, peripheral edema, and claudication. Resp: Denies dyspnea at rest, cough, wheezing, coughing up blood, and pleurisy. GI: see HPI  Derm: Denies rash, itching, dry skin Psych: Denies depression, anxiety, memory loss, confusion. No homicidal or suicidal ideation.  Heme: Denies bruising, bleeding, and enlarged lymph  nodes.  Physical Exam: BP 106/66   Pulse 79   Temp (!) 97.2 F (36.2 C) (Oral)   Ht 5\' 8"  (1.727 m)   Wt 195 lb (88.5 kg)   BMI 29.65 kg/m  General:   Alert and oriented. No distress noted. Pleasant and cooperative.  Head:  Normocephalic and atraumatic. Eyes:  Conjuctiva clear without scleral icterus. Mouth:  Oral mucosa pink and moist.  Abdomen:  +BS, soft, non-tender and non-distended. No rebound or guarding. No HSM or masses noted. Msk:  Symmetrical without gross deformities. Normal posture. Extremities:  Without edema. Neurologic:  Alert and  oriented x4 Psych:  Alert and cooperative. Normal mood and affect.

## 2017-08-02 NOTE — Patient Instructions (Signed)
It was SO GOOD to see you! I am so glad things are improving. I look forward to hear how your appointment at [facility] goes!  Have a wonderful Thanksgiving and Christmas. Let me know if you need anything!  I will see you in 3 months!

## 2017-08-04 NOTE — Assessment & Plan Note (Signed)
51 year old male with history of ETOH cirrhosis, abstaining from alcohol since May 2018. With diuretic adjustments (now on Aldactone 200 mg daily and Lasix 80 mg daily per [facility]), he has done well without need for further paracenteses since Sept 2018. Recovering well from emergent hernia repair in October 2018. Continues with lactulose. Fairly well compensated at this time. He will be seeing [facility] again tomorrow, and it appears he has a 2 day event in Dec 2018 whereby he will be meeting with multiple specialties in preparation for transplant candidacy. Applauded him on his markedly improved lifestyle, behaviors, and commitment to his health. Next EGD in Dec 2019. Return in 3 months. Will hold on any imaging or labs, as he will have this extensively evaluated at [facility].

## 2017-08-05 NOTE — Progress Notes (Signed)
CC'D TO PCP °

## 2017-08-25 ENCOUNTER — Telehealth: Payer: Self-pay

## 2017-08-25 NOTE — Telephone Encounter (Signed)
Received the release from [facility] [place]. Faxed the last OV note of Lewie LoronAnna Boone, NP to them which included the dosage of the medications. Faxed to [phone].  Copy of the release to be scanned in by Darl PikesSusan.

## 2017-08-25 NOTE — Telephone Encounter (Signed)
[place] [facility] [place] [facility] She was requesting does of Lasix and Spironolactone. She is faxing over a release and I will fax the info to her.

## 2017-09-13 ENCOUNTER — Emergency Department (HOSPITAL_COMMUNITY): Payer: Medicaid - Out of State

## 2017-09-13 ENCOUNTER — Emergency Department (HOSPITAL_COMMUNITY)
Admission: EM | Admit: 2017-09-13 | Discharge: 2017-09-13 | Disposition: A | Discharge status: Home / Self Care | Payer: Medicaid - Out of State | Attending: Emergency Medicine | Admitting: Emergency Medicine

## 2017-09-13 ENCOUNTER — Encounter (HOSPITAL_COMMUNITY): Payer: Self-pay | Admitting: *Deleted

## 2017-09-13 ENCOUNTER — Other Ambulatory Visit: Payer: Self-pay

## 2017-09-13 DIAGNOSIS — K4090 Unilateral inguinal hernia, without obstruction or gangrene, not specified as recurrent: Secondary | ICD-10-CM | POA: Diagnosis not present

## 2017-09-13 DIAGNOSIS — Z79899 Other long term (current) drug therapy: Secondary | ICD-10-CM | POA: Diagnosis not present

## 2017-09-13 DIAGNOSIS — J449 Chronic obstructive pulmonary disease, unspecified: Secondary | ICD-10-CM | POA: Insufficient documentation

## 2017-09-13 DIAGNOSIS — R1031 Right lower quadrant pain: Secondary | ICD-10-CM | POA: Diagnosis present

## 2017-09-13 DIAGNOSIS — F17210 Nicotine dependence, cigarettes, uncomplicated: Secondary | ICD-10-CM | POA: Diagnosis not present

## 2017-09-13 LAB — CBC WITH DIFFERENTIAL/PLATELET
BASOS ABS: 0 10*3/uL (ref 0.0–0.1)
BASOS PCT: 1 %
EOS ABS: 0.1 10*3/uL (ref 0.0–0.7)
EOS PCT: 2 %
HCT: 44.9 % (ref 39.0–52.0)
Hemoglobin: 14.9 g/dL (ref 13.0–17.0)
LYMPHS PCT: 22 %
Lymphs Abs: 1.4 10*3/uL (ref 0.7–4.0)
MCH: 30.9 pg (ref 26.0–34.0)
MCHC: 33.2 g/dL (ref 30.0–36.0)
MCV: 93.2 fL (ref 78.0–100.0)
Monocytes Absolute: 0.3 10*3/uL (ref 0.1–1.0)
Monocytes Relative: 5 %
Neutro Abs: 4.5 10*3/uL (ref 1.7–7.7)
Neutrophils Relative %: 70 %
PLATELETS: 110 10*3/uL — AB (ref 150–400)
RBC: 4.82 MIL/uL (ref 4.22–5.81)
RDW: 15.3 % (ref 11.5–15.5)
WBC: 6.4 10*3/uL (ref 4.0–10.5)

## 2017-09-13 LAB — COMPREHENSIVE METABOLIC PANEL
ALT: 21 U/L (ref 17–63)
AST: 54 U/L — AB (ref 15–41)
Albumin: 3.9 g/dL (ref 3.5–5.0)
Alkaline Phosphatase: 147 U/L — ABNORMAL HIGH (ref 38–126)
Anion gap: 14 (ref 5–15)
BUN: 6 mg/dL (ref 6–20)
CHLORIDE: 103 mmol/L (ref 101–111)
CO2: 22 mmol/L (ref 22–32)
CREATININE: 0.71 mg/dL (ref 0.61–1.24)
Calcium: 8.9 mg/dL (ref 8.9–10.3)
GFR calc Af Amer: >60 mL/min (ref >60)
GFR calc non Af Amer: >60 mL/min (ref >60)
Glucose, Bld: 181 mg/dL — ABNORMAL HIGH (ref 65–99)
POTASSIUM: 3.7 mmol/L (ref 3.5–5.1)
Sodium: 139 mmol/L (ref 135–145)
Total Bilirubin: 1.7 mg/dL — ABNORMAL HIGH (ref 0.3–1.2)
Total Protein: 7.6 g/dL (ref 6.5–8.1)

## 2017-09-13 LAB — URINALYSIS, ROUTINE W REFLEX MICROSCOPIC
BILIRUBIN URINE: NEGATIVE
Glucose, UA: NEGATIVE mg/dL
HGB URINE DIPSTICK: NEGATIVE
Ketones, ur: NEGATIVE mg/dL
Leukocytes, UA: NEGATIVE
Nitrite: NEGATIVE
PH: 6 (ref 5.0–8.0)
Protein, ur: NEGATIVE mg/dL
SPECIFIC GRAVITY, URINE: 1.017 (ref 1.005–1.030)

## 2017-09-13 MED ORDER — ONDANSETRON HCL 4 MG/2ML IJ SOLN
4.0000 mg | Freq: Once | INTRAMUSCULAR | Status: AC
  Administered 2017-09-13: 4 mg via INTRAVENOUS
  Filled 2017-09-13: qty 2

## 2017-09-13 MED ORDER — LORAZEPAM 1 MG PO TABS: 1.0000 mg | ORAL_TABLET | ORAL | 0 refills | Status: DC

## 2017-09-13 MED ORDER — MORPHINE SULFATE (PF) 4 MG/ML IV SOLN
4.0000 mg | Freq: Once | INTRAVENOUS | Status: AC
  Administered 2017-09-13: 4 mg via INTRAVENOUS

## 2017-09-13 MED ORDER — IOPAMIDOL (ISOVUE-300) INJECTION 61%
100.0000 mL | Freq: Once | INTRAVENOUS | Status: AC | PRN
  Administered 2017-09-13: 100 mL via INTRAVENOUS

## 2017-09-13 MED ORDER — MORPHINE SULFATE (PF) 2 MG/ML IV SOLN
INTRAVENOUS | Status: AC
  Filled 2017-09-13: qty 2

## 2017-09-13 NOTE — Discharge Instructions (Signed)
You have a hernia that is fluid filled in the right groin. Please have your primary care doctor refer you to other surgeons for consultation regarding fixing this hernia as well as for ongoing pain control  You have been written an ativan taper for alcohol withdrawal symptoms. Please return for worsening symptoms, including seizures, confusion, intractable vomiting, escalating pain or any other symptoms concerning to you.

## 2017-09-13 NOTE — ED Triage Notes (Signed)
Pt c/o right groin pain that has been going on intermittently x 1 year but got progressively worse since last night. Pt has seen Dr. Lovell SheehanJenkins and been told he has a hernia. Denies urinary problems.

## 2017-09-13 NOTE — ED Provider Notes (Signed)
[facility] EMERGENCY DEPARTMENT Provider Note   CSN: [id number] Arrival date & time: 09/13/17  1429     History   Chief Complaint Chief Complaint  Patient presents with  . Groin Pain    HPI Joseph Cortez is a 51 y.o. male.  HPI 51 year old male who presents with right groin pain.  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  He has known right inguinal hernia that has been present for over one year.  [facility] with plans for operative management after Thanksgiving, but states that his Medicaid is out of [place] and would not be covered unless surgery is emergent.  He has been having gradually worsening pain during this period of time and increased swelling in the groin.  Over the last day symptoms have been more severe.  Denies any nausea, vomiting, constipation, diarrhea, fevers, chills, or urinary complaints. No alleviating factors. Has not tried to reduce hernia. Symptoms aggravated by palpation.   Past Medical History:  Diagnosis Date  . AKI (acute kidney injury) (HCC) 07/14/2016  . Anemia   . Anxiety   . Arthritis   . Blood transfusion without reported diagnosis   . Cirrhosis (HCC)   . COPD (chronic obstructive pulmonary disease) (HCC)   . Depression   . Dyspnea   . Hypokalemia 07/14/2016  . Hyponatremia 07/14/2016  . Neuromuscular disorder (HCC)    Neuropathy  . Neuropathy   . Substance abuse (HCC)   . Ulcer    BLEEDING VARICES, ULCERATIONS    Patient Active Problem List   Diagnosis Date Noted  . Strangulated umbilical hernia 07/06/2017  . Umbilical hernia with obstruction   . Lactic acid acidosis   . Rectal bleeding 04/28/2017  . Abdominal pain 11/20/2016  . Diarrhea 11/20/2016  . Tobacco abuse 11/20/2016  . Chronic midline low back pain without sciatica 09/01/2016  . Chronic insomnia 09/01/2016  . Degenerative joint disease (DJD) of  lumbar spine 09/01/2016  . Alcoholic hepatitis with ascites 07/14/2016  . Alcohol abuse 07/14/2016  . Anxiety 07/14/2016  . Coagulopathy (HCC) 07/14/2016  . Thrombocytopenia (HCC) 07/14/2016  . Elevated LFTs   . Esophageal varices without bleeding (HCC)   . Portal hypertensive gastropathy (HCC)   . Duodenal ulcer   . Reflux esophagitis   . Hepatic cirrhosis (HCC) 06/10/2015    Past Surgical History:  Procedure Laterality Date  . BIOPSY  06/27/2015   Procedure: BIOPSY (Gastric);  Surgeon: Corbin Adeobert M Rourk, MD;  Location: [facility] ORS;  Service: Endoscopy;;  . CHOLECYSTECTOMY    . COLONOSCOPY WITH PROPOFOL N/A 05/24/2017   Procedure: COLONOSCOPY WITH PROPOFOL;  Surgeon: Corbin Adeourk, Robert M, MD;  Location: [facility] ENDO SUITE;  Service: Endoscopy;  Laterality: N/A;  8:30am  . ESOPHAGEAL BANDING N/A 01/30/2016   Procedure: ESOPHAGEAL BANDING;  Surgeon: Corbin Adeobert M Rourk, MD;  Location: [facility] ENDO SUITE;  Service: Endoscopy;  Laterality: N/A;  . ESOPHAGOGASTRODUODENOSCOPY  January 06, 2016   [facility]: duodenitis in bullb, portal gastropathy, 2 columns of large distal esophageal varices, one with flat red spot, s/p band ligation. Needs 4 week surveillance EGD   . ESOPHAGOGASTRODUODENOSCOPY (EGD) WITH PROPOFOL N/A 06/27/2015   WUJ:WJXBRMR:mild erosive reflux esophagitis, portal gastropathy, duodenal bulbar ulcer, negative H.pylori. Surveillance due yearly according to ASGE current guidelines regarding ETOH cirrhosis and ongoing ETOH abuse  . ESOPHAGOGASTRODUODENOSCOPY (EGD) WITH PROPOFOL N/A 01/30/2016   Dr. Jena Gaussourk: Grade 2 varices s/p  banding with complete deflation of varices, portal gastropathy, surveillance in May 2018  . ESOPHAGOGASTRODUODENOSCOPY (EGD) WITH PROPOFOL N/A 03/15/2017   4 columns of Grade 2 varices s/p banding and completely eradicated. cherry red spots overlying one of the columns distally. portal gastropathy. EGD in 1.5 years   . UMBILICAL HERNIA REPAIR N/A 07/06/2017   Procedure: REDUCTION AND REPAIR  OF STRANGULATED UMBILICAL HERNIA;  Surgeon: Sheliah HatchKinsinger, De BlanchLuke Aaron, MD;  Location: [facility] OR;  Service: General;  Laterality: N/A;       Home Medications    Prior to Admission medications   Medication Sig Start Date End Date Taking? Authorizing Provider  acetaminophen (TYLENOL) 650 MG CR tablet Take 650 mg by mouth every 8 (eight) hours as needed for pain.    [provider]  amitriptyline (ELAVIL) 25 MG tablet Take 1 tablet as needed by mouth.  04/19/17   [provider]  B Complex-Biotin-FA (B COMPLETE PO) Take 1 tablet by mouth daily.    [provider]  bismuth subsalicylate (PEPTO BISMOL) 262 MG/15ML suspension Take 30 mLs by mouth every 6 (six) hours as needed (for diarrhea/loose stools or upset stomach.).     [provider]  furosemide (LASIX) 80 MG tablet Take 1 tablet by mouth daily. 06/28/17   [provider]  gabapentin (NEURONTIN) 300 MG capsule Take 300-600 mg by mouth See admin instructions. 300mg  in the morning, 300mg  at 4 pm, and 600mg  at bedtime    [provider]  lactulose (CHRONULAC) 10 GM/15ML solution TAKE 45 ML BY MOUTH 2 to 3 times daily 04/01/17   Tiffany KocherLewis, Leslie S, PA-C  LORazepam (ATIVAN) 1 MG tablet Take 1 tablet (1 mg total) by mouth See admin instructions. 2 mg every 6 hours for 4 doses, then 1 mg every 6 hours for 8 additional doses 09/13/17   Lavera GuiseLiu, Matther Labell Duo, MD  Multiple Vitamin (MULTIVITAMIN WITH MINERALS) TABS tablet Take 1 tablet by mouth daily.    [provider]  ondansetron (ZOFRAN) 4 MG tablet Take 1 tablet (4 mg total) by mouth every 8 (eight) hours as needed for nausea or vomiting. 06/02/17   Gelene MinkBoone, Anna W, NP  pantoprazole (PROTONIX) 40 MG tablet Take 1 tablet (40 mg total) by mouth 2 (two) times daily before a meal. Patient taking differently: Take 40 mg by mouth daily.  01/27/17   Gelene MinkBoone, Anna W, NP  spironolactone (ALDACTONE) 100 MG tablet Take 2 tablets by mouth daily. 06/28/17   [provider]   Thiamine HCl (VITAMIN B-1 PO) Take 1 tablet by mouth daily.    [provider]    Family History Family History  Problem Relation Age of Onset  . Colon cancer Maternal Uncle   . Alcohol abuse Maternal Uncle   . Arthritis Mother   . Cancer Mother        breast cancer  . Hypertension Father   . Hyperlipidemia Father   . Lupus Sister   . Alcohol abuse Paternal Uncle   . Alzheimer's disease Maternal Grandmother   . COPD Maternal Grandfather   . Diabetes Maternal Grandfather   . Heart disease Paternal Grandfather   . Alcohol abuse Cousin     Social History Social History   Tobacco Use  . Smoking status: Current Every Day Smoker    Types: Cigarettes  . Smokeless tobacco: Never Used  . Tobacco comment: 3-4 cigarettes daily   Substance Use Topics  . Alcohol use: Yes    Alcohol/week: 0.0 oz  Frequency: Never  . Drug use: No    Comment: hx of cocaine use     Allergies   Sertraline   Review of Systems Review of Systems  Constitutional: Negative for fever.  Cardiovascular: Negative for chest pain.  Gastrointestinal: Negative for abdominal pain, nausea and vomiting.  Genitourinary:       Right groin pain  All other systems reviewed and are negative.    Physical Exam Updated Vital Signs BP 132/89 (BP Location: Left Arm)   Pulse 95   Temp 98.3 F (36.8 C) (Oral)   Resp 18   Ht 5\' 8"  (1.727 m)   Wt 88.5 kg (195 lb)   SpO2 98%   BMI 29.65 kg/m   Physical Exam Physical Exam  Nursing note and vitals reviewed. Constitutional: chronically ill appearing, non-toxic, and in no acute distress Head: Normocephalic and atraumatic.  Mouth/Throat: Oropharynx is clear and moist.  Neck: Normal range of motion. Neck supple.  Cardiovascular: Normal rate and regular rhythm.   Pulmonary/Chest: Effort normal and breath sounds normal.  Abdominal: Soft. There is moderate distension. tenderness. There is no rebound and no guarding.  GU: swollen right scrotum, without  obvious palpable bowel, fluid filled, large palpable defect. No testicular tenderness or masses. Musculoskeletal: Normal range of motion.  Neurological: Alert, no facial droop, fluent speech, moves all extremities symmetrically Skin: Skin is warm and dry.  Psychiatric: Cooperative   ED Treatments / Results  Labs (all labs ordered are listed, but only abnormal results are displayed) Labs Reviewed  CBC WITH DIFFERENTIAL/PLATELET - Abnormal; Notable for the following components:      Result Value   Platelets 110 (*)    All other components within normal limits  COMPREHENSIVE METABOLIC PANEL - Abnormal; Notable for the following components:   Glucose, Bld 181 (*)    AST 54 (*)    Alkaline Phosphatase 147 (*)    Total Bilirubin 1.7 (*)    All other components within normal limits  URINALYSIS, ROUTINE W REFLEX MICROSCOPIC - Abnormal; Notable for the following components:   Color, Urine AMBER (*)    All other components within normal limits    EKG  EKG Interpretation None       Radiology Ct Abdomen Pelvis W Contrast  Result Date: 09/13/2017 CLINICAL DATA:  Abdominal distention with right inguinal region pain EXAM: CT ABDOMEN AND PELVIS WITH CONTRAST TECHNIQUE: Multidetector CT imaging of the abdomen and pelvis was performed using the standard protocol following bolus administration of intravenous contrast. CONTRAST:  ISOVUE-300 IOPAMIDOL (ISOVUE-300) INJECTION 61% COMPARISON:  July 06, 2017 FINDINGS: Lower chest: There is a sizable right pleural effusion with right base atelectasis. Left base clear. Hepatobiliary: The liver has a diffusely nodular contour consistent with cirrhosis. This appearance is similar compared to prior study. There is recanalization of the umbilical vein. The main portal vein has a somewhat mixed attenuation pattern, suggesting that there may be both hepatopetal and hepatofugal flow in this area currently. No focal liver lesions are evident. Gallbladder  is absent. There is no appreciable biliary duct dilatation. Pancreas: No evident pancreatic mass or inflammatory focus. Spleen: Spleen measures 15.8 x 12.8 x 7.9 cm with a measured volume of 798 cubic cm. No focal splenic lesions are evident. There are prominent perisplenic vessels consistent with varices. Adrenals/Urinary Tract: Adrenals appear normal bilaterally. Kidneys bilaterally show no evident mass or hydronephrosis. There is no renal or ureteral calculus on either side. Urinary bladder is midline with wall thickness within normal limits.  Stomach/Bowel: On the current examination, the wall of the stomach appears somewhat thickened. There is no appreciable small or large bowel wall thickening. There is mesenteric thickening in the mid abdomen, likely in part due to the surrounding ascites. No bowel obstruction evident. No free air or portal venous air. Vascular/Lymphatic: There is atherosclerotic calcification in the aorta and common iliac arteries. No aneurysm evident. Major mesenteric vessels appear patent. There are small retroperitoneal lymph nodes which do not meet size criteria for pathologic significance. There are mesenteric lymph nodes, subcentimeter, likely secondary to the cirrhosis. There is no frank adenopathy by size criteria. Reproductive: There are a few prostatic calculi. Prostate and seminal vesicles appear normal in size and contour. Other: There is extensive ascites. Ascites extends into a small ventral hernia. There is no longer bowel extending into this hernia. There is a large right inguinal hernia which is filled with ascites and fat. This fluid tracks to the level of the scrotum on the right with fluid also tracking into the right perineum from the hernia. No bowel extends into this hernia. Appendix appears normal. No abscess is evident in the abdomen or pelvis. Musculoskeletal: There is degenerative change in the lumbar spinal fracture L4-5. There are no blastic or lytic bone lesions.  There is no intramuscular lesion evident. IMPRESSION: 1. Hepatic cirrhosis. Suspect flow in the portal vein both hepatopetal and hepatofugal. There is recanalization of the umbilical vein. There is splenomegaly with perisplenic varices. 2.  Extensive ascites. 3. There is a large right inguinal hernia containing fat and fluid. Fluid within this hernia tracks to the level of the right scrotum and into the right perineum. No bowel extends into this hernia. 4.  Small ventral hernia containing ascites but no bowel. 5. Less than a degree of gastritis. No bowel obstruction. No small or large bowel dilatation evident. Mesenteric thickening in the mid abdomen is likely due to the ascites. 6.  Gallbladder absent. 7. No renal or ureteral calculi. No hydronephrosis. There is a small prostatic calculus. 8.  Aortoiliac atherosclerosis evident. Aortic Atherosclerosis (ICD10-I70.0). Electronically Signed   By: Bretta Bang III M.D.   On: 09/13/2017 20:49    Procedures Procedures (including critical care time)  Medications Ordered in ED Medications  morphine 2 MG/ML injection (not administered)  morphine 4 MG/ML injection 4 mg (4 mg Intravenous Given 09/13/17 1927)  ondansetron (ZOFRAN) injection 4 mg (4 mg Intravenous Given 09/13/17 1927)  iopamidol (ISOVUE-300) 61 % injection 100 mL (100 mLs Intravenous Contrast Given 09/13/17 2003)     Initial Impression / Assessment and Plan / ED Course  I have reviewed the triage vital signs and the nursing notes.  Pertinent labs & imaging results that were available during my care of the patient were reviewed by me and considered in my medical decision making (see chart for details).     52 year old male who presents with painful right inguinal hernia.  He is nontoxic in no acute distress with stable vital signs.  Abdomen is soft and benign but distended, likely due to ascites from his known liver disease.  He has evidence of a large right inguinal hernia, but no  obvious palpable bowel is noted on exam.  There is large defect and there appears to be a significant amount of fluid in the right scrotum.  CT was performed given persistent pain and question of potential incarcerated bowel in the hernia.  This is visualized and shows no evidence of incarceration or strangulation of the hernia.  He  has obvious cirrhosis with ascites but no other acute intra-abdominal processes.  Patient also states that although he quit alcohol, he has relapsed over the last 6 months.  States that he has tried to come off of alcohol himself but gets very tremulous and shaky.  I will prescribe him a short Ativan taper to help him with withdrawal symptoms.  Did discuss return instructions for complicated withdrawal symptoms or symptoms of incarcerated hernia. Strict return and follow-up instructions reviewed. He expressed understanding of all discharge instructions and felt comfortable with the plan of care.   Final Clinical Impressions(s) / ED Diagnoses   Final diagnoses:  Right inguinal hernia    ED Discharge Orders        Ordered    LORazepam (ATIVAN) 1 MG tablet  See admin instructions     09/13/17 2108       Lavera Guise, MD 09/13/17 2111

## 2017-09-28 DIAGNOSIS — I609 Nontraumatic subarachnoid hemorrhage, unspecified: Secondary | ICD-10-CM

## 2017-09-28 HISTORY — DX: Nontraumatic subarachnoid hemorrhage, unspecified: I60.9

## 2017-10-08 MED ORDER — GENERIC EXTERNAL MEDICATION
Status: DC
Start: ? — End: 2017-10-08

## 2017-10-08 MED ORDER — ONDANSETRON HCL 4 MG/2ML IJ SOLN
4.00 | INTRAMUSCULAR | Status: DC
Start: ? — End: 2017-10-08

## 2017-10-08 MED ORDER — GENERIC EXTERNAL MEDICATION
0.20 | Status: DC
Start: ? — End: 2017-10-08

## 2017-10-08 MED ORDER — MELATONIN 3 MG PO TABS
6.00 | ORAL_TABLET | ORAL | Status: DC
Start: 2017-11-01 — End: 2017-10-08

## 2017-10-08 MED ORDER — PANTOPRAZOLE SODIUM 40 MG PO TBEC
40.00 | DELAYED_RELEASE_TABLET | ORAL | Status: DC
Start: 2017-10-12 — End: 2017-10-08

## 2017-10-08 MED ORDER — IPRATROPIUM-ALBUTEROL 0.5-2.5 (3) MG/3ML IN SOLN
3.00 | RESPIRATORY_TRACT | Status: DC
Start: 2017-11-01 — End: 2017-10-08

## 2017-10-08 MED ORDER — LIDOCAINE 4 % EX PTCH
2.00 | MEDICATED_PATCH | CUTANEOUS | Status: DC
Start: 2017-11-02 — End: 2017-10-08

## 2017-10-08 MED ORDER — GLUCOSE 40 % PO GEL
1.00 | ORAL | Status: DC
Start: ? — End: 2017-10-08

## 2017-10-08 MED ORDER — GENERIC EXTERNAL MEDICATION
1500.00 | Status: DC
Start: 2017-10-09 — End: 2017-10-08

## 2017-10-08 MED ORDER — MAGNESIUM HYDROXIDE 400 MG/5ML PO SUSP
30.00 | ORAL | Status: DC
Start: 2017-10-14 — End: 2017-10-08

## 2017-10-08 MED ORDER — GENERIC EXTERNAL MEDICATION
1.00 | Status: DC
Start: ? — End: 2017-10-08

## 2017-10-08 MED ORDER — RIFAXIMIN 200 MG PO TABS
550.00 | ORAL_TABLET | ORAL | Status: DC
Start: 2017-10-13 — End: 2017-10-08

## 2017-10-08 MED ORDER — GENERIC EXTERNAL MEDICATION
2.00 | Status: DC
Start: 2017-10-09 — End: 2017-10-08

## 2017-10-08 MED ORDER — VITAMIN B-1 100 MG PO TABS
100.00 | ORAL_TABLET | ORAL | Status: DC
Start: 2017-10-14 — End: 2017-10-08

## 2017-10-08 MED ORDER — ENOXAPARIN SODIUM 60 MG/0.6ML ~~LOC~~ SOLN
50.00 | SUBCUTANEOUS | Status: DC
Start: 2017-10-14 — End: 2017-10-08

## 2017-10-08 MED ORDER — GENERIC EXTERNAL MEDICATION
500.00 | Status: DC
Start: 2017-11-02 — End: 2017-10-08

## 2017-10-08 MED ORDER — METRONIDAZOLE 500 MG PO TABS
500.00 | ORAL_TABLET | ORAL | Status: DC
Start: 2017-10-08 — End: 2017-10-08

## 2017-10-08 MED ORDER — POTASSIUM CHLORIDE CRYS ER 20 MEQ PO TBCR
20.00 | EXTENDED_RELEASE_TABLET | ORAL | Status: DC
Start: 2017-10-09 — End: 2017-10-08

## 2017-10-08 MED ORDER — DEXTROSE 10 % IV SOLN
50.00 | INTRAVENOUS | Status: DC
Start: ? — End: 2017-10-08

## 2017-10-08 MED ORDER — CHLORHEXIDINE GLUCONATE 0.12 % MT SOLN
15.00 | OROMUCOSAL | Status: DC
Start: 2017-10-13 — End: 2017-10-08

## 2017-10-08 MED ORDER — GENERIC EXTERNAL MEDICATION
2.00 | Status: DC
Start: 2017-10-08 — End: 2017-10-08

## 2017-10-08 MED ORDER — VITAMINS/MINERALS PO TABS
ORAL_TABLET | ORAL | Status: DC
Start: 2017-11-02 — End: 2017-10-08

## 2017-10-08 MED ORDER — INSULIN ASPART 100 UNIT/ML FLEXPEN
PEN_INJECTOR | SUBCUTANEOUS | Status: DC
Start: 2017-11-01 — End: 2017-10-08

## 2017-10-08 MED ORDER — NALOXONE HCL 0.4 MG/ML IJ SOLN
2.00 | INTRAMUSCULAR | Status: DC
Start: 2017-10-11 — End: 2017-10-08

## 2017-10-08 MED ORDER — GENERIC EXTERNAL MEDICATION
0.04 | Status: DC
Start: ? — End: 2017-10-08

## 2017-10-08 MED ORDER — GABAPENTIN 300 MG PO CAPS
600.00 | ORAL_CAPSULE | ORAL | Status: DC
Start: 2017-11-01 — End: 2017-10-08

## 2017-10-08 MED ORDER — DEXTROSE 50 % IV SOLN
25.00 | INTRAVENOUS | Status: DC
Start: ? — End: 2017-10-08

## 2017-10-08 MED ORDER — SERTRALINE HCL 50 MG PO TABS
25.00 | ORAL_TABLET | ORAL | Status: DC
Start: 2017-11-02 — End: 2017-10-08

## 2017-10-08 MED ORDER — ALBUMIN HUMAN 25 % IV SOLN
25.00 | INTRAVENOUS | Status: DC
Start: 2017-10-11 — End: 2017-10-08

## 2017-10-08 MED ORDER — FOLIC ACID 1 MG PO TABS
0.50 | ORAL_TABLET | ORAL | Status: DC
Start: 2017-10-14 — End: 2017-10-08

## 2017-10-10 MED ORDER — GENERIC EXTERNAL MEDICATION
Status: DC
Start: ? — End: 2017-10-10

## 2017-10-10 MED ORDER — CEFTRIAXONE SODIUM 2 G IJ SOLR
2.00 | INTRAMUSCULAR | Status: DC
Start: 2017-10-12 — End: 2017-10-10

## 2017-10-10 MED ORDER — SODIUM CHLORIDE 3 % IN NEBU
3.00 | INHALATION_SOLUTION | RESPIRATORY_TRACT | Status: DC
Start: 2017-10-10 — End: 2017-10-10

## 2017-10-10 MED ORDER — GENERIC EXTERNAL MEDICATION
Status: DC
Start: 2017-10-13 — End: 2017-10-10

## 2017-10-10 MED ORDER — ACETAMINOPHEN 650 MG/20.3ML PO SOLN
325.00 | ORAL | Status: DC
Start: ? — End: 2017-10-10

## 2017-10-10 MED ORDER — POTASSIUM CHLORIDE 20 MEQ/100ML IV SOLN
20.00 | INTRAVENOUS | Status: DC
Start: 2017-10-10 — End: 2017-10-10

## 2017-10-10 MED ORDER — KCL IN DEXTROSE-NACL 40-5-0.45 MEQ/L-%-% IV SOLN
100.00 | INTRAVENOUS | Status: DC
Start: ? — End: 2017-10-10

## 2017-10-10 MED ORDER — LACTULOSE 10 GM/15ML PO SOLN
20.00 | ORAL | Status: DC
Start: 2017-10-13 — End: 2017-10-10

## 2017-10-11 MED ORDER — GENERIC EXTERNAL MEDICATION
Status: DC
Start: ? — End: 2017-10-11

## 2017-10-11 MED ORDER — OXYCODONE HCL 5 MG PO TABS
5.00 | ORAL_TABLET | ORAL | Status: DC
Start: ? — End: 2017-10-11

## 2017-10-11 MED ORDER — SODIUM CHLORIDE 3 % IN NEBU
3.00 | INHALATION_SOLUTION | RESPIRATORY_TRACT | Status: DC
Start: 2017-10-13 — End: 2017-10-11

## 2017-10-13 MED ORDER — FUROSEMIDE 10 MG/ML IJ SOLN
40.00 | INTRAMUSCULAR | Status: DC
Start: 2017-10-14 — End: 2017-10-13

## 2017-10-13 MED ORDER — GENERIC EXTERNAL MEDICATION
Status: DC
Start: ? — End: 2017-10-13

## 2017-10-13 MED ORDER — INFLUENZA VAC SPLIT QUAD 0.5 ML IM SUSY
0.50 | PREFILLED_SYRINGE | INTRAMUSCULAR | Status: DC
Start: 2017-10-16 — End: 2017-10-13

## 2017-10-13 MED ORDER — FENTANYL CITRATE (PF) 2500 MCG/50ML IJ SOLN
200.00 | INTRAMUSCULAR | Status: DC
Start: ? — End: 2017-10-13

## 2017-10-13 MED ORDER — GENERIC EXTERNAL MEDICATION
40.00 | Status: DC
Start: 2017-10-14 — End: 2017-10-13

## 2017-10-13 MED ORDER — FENTANYL CITRATE (PF) 2500 MCG/50ML IJ SOLN
12.50 | INTRAMUSCULAR | Status: DC
Start: 2017-10-13 — End: 2017-10-13

## 2017-10-13 MED ORDER — PROPOFOL IV
2.00 | INTRAVENOUS | Status: DC
Start: 2017-10-13 — End: 2017-10-13

## 2017-10-13 MED ORDER — GENERIC EXTERNAL MEDICATION
15.00 | Status: DC
Start: 2017-10-13 — End: 2017-10-13

## 2017-11-01 MED ORDER — LACTULOSE 10 GM/15ML PO SOLN
20.00 | ORAL | Status: DC
Start: 2017-11-01 — End: 2017-11-01

## 2017-11-01 MED ORDER — SODIUM CHLORIDE 3 % IN NEBU
3.00 | INHALATION_SOLUTION | RESPIRATORY_TRACT | Status: DC
Start: ? — End: 2017-11-01

## 2017-11-01 MED ORDER — GENERIC EXTERNAL MEDICATION
Status: DC
Start: ? — End: 2017-11-01

## 2017-11-01 MED ORDER — SPIRONOLACTONE 25 MG/5ML PO SUSP
200.00 | ORAL | Status: DC
Start: 2017-11-02 — End: 2017-11-01

## 2017-11-01 MED ORDER — HYOSCYAMINE SULFATE 0.125 MG SL SUBL
0.13 | SUBLINGUAL_TABLET | SUBLINGUAL | Status: DC
Start: ? — End: 2017-11-01

## 2017-11-01 MED ORDER — PANTOPRAZOLE SODIUM 40 MG PO TBEC
40.00 | DELAYED_RELEASE_TABLET | ORAL | Status: DC
Start: 2017-11-02 — End: 2017-11-01

## 2017-11-01 MED ORDER — SODIUM CHLORIDE 0.65 % NA SOLN
2.00 | NASAL | Status: DC
Start: ? — End: 2017-11-01

## 2017-11-01 MED ORDER — FUROSEMIDE 8 MG/ML PO SOLN
80.00 | ORAL | Status: DC
Start: 2017-11-02 — End: 2017-11-01

## 2017-11-01 MED ORDER — OXYCODONE HCL 5 MG PO TABS
5.00 | ORAL_TABLET | ORAL | Status: DC
Start: ? — End: 2017-11-01

## 2017-11-01 MED ORDER — GENERIC EXTERNAL MEDICATION
Status: DC
Start: 2017-11-01 — End: 2017-11-01

## 2017-11-01 MED ORDER — ENOXAPARIN SODIUM 60 MG/0.6ML ~~LOC~~ SOLN
60.00 | SUBCUTANEOUS | Status: DC
Start: 2017-11-01 — End: 2017-11-01

## 2017-11-02 ENCOUNTER — Ambulatory Visit: Payer: Medicaid - Out of State | Admitting: Gastroenterology

## 2018-01-06 ENCOUNTER — Encounter (HOSPITAL_COMMUNITY)
Admission: EM | Disposition: A | Discharge status: Home / Self Care | Payer: Self-pay | Source: Home / Self Care | Attending: Internal Medicine

## 2018-01-06 ENCOUNTER — Encounter (HOSPITAL_COMMUNITY): Payer: Self-pay | Admitting: Emergency Medicine

## 2018-01-06 ENCOUNTER — Inpatient Hospital Stay (HOSPITAL_COMMUNITY)
Admission: EM | Admit: 2018-01-06 | Discharge: 2018-01-10 | DRG: 811 | Disposition: A | Discharge status: Home / Self Care | Payer: Medicaid - Out of State | Attending: Internal Medicine | Admitting: Internal Medicine

## 2018-01-06 ENCOUNTER — Inpatient Hospital Stay (HOSPITAL_COMMUNITY): Payer: Medicaid - Out of State | Admitting: Anesthesiology

## 2018-01-06 ENCOUNTER — Other Ambulatory Visit: Payer: Self-pay

## 2018-01-06 ENCOUNTER — Inpatient Hospital Stay (HOSPITAL_COMMUNITY): Payer: Medicaid - Out of State

## 2018-01-06 DIAGNOSIS — D696 Thrombocytopenia, unspecified: Secondary | ICD-10-CM | POA: Diagnosis not present

## 2018-01-06 DIAGNOSIS — J90 Pleural effusion, not elsewhere classified: Secondary | ICD-10-CM | POA: Diagnosis present

## 2018-01-06 DIAGNOSIS — F329 Major depressive disorder, single episode, unspecified: Secondary | ICD-10-CM | POA: Diagnosis present

## 2018-01-06 DIAGNOSIS — K922 Gastrointestinal hemorrhage, unspecified: Secondary | ICD-10-CM

## 2018-01-06 DIAGNOSIS — F419 Anxiety disorder, unspecified: Secondary | ICD-10-CM | POA: Diagnosis present

## 2018-01-06 DIAGNOSIS — K766 Portal hypertension: Secondary | ICD-10-CM | POA: Diagnosis not present

## 2018-01-06 DIAGNOSIS — Z888 Allergy status to other drugs, medicaments and biological substances status: Secondary | ICD-10-CM

## 2018-01-06 DIAGNOSIS — I8500 Esophageal varices without bleeding: Secondary | ICD-10-CM

## 2018-01-06 DIAGNOSIS — D735 Infarction of spleen: Secondary | ICD-10-CM | POA: Diagnosis present

## 2018-01-06 DIAGNOSIS — K7031 Alcoholic cirrhosis of liver with ascites: Secondary | ICD-10-CM | POA: Diagnosis present

## 2018-01-06 DIAGNOSIS — Z79899 Other long term (current) drug therapy: Secondary | ICD-10-CM | POA: Diagnosis not present

## 2018-01-06 DIAGNOSIS — G629 Polyneuropathy, unspecified: Secondary | ICD-10-CM | POA: Diagnosis present

## 2018-01-06 DIAGNOSIS — K920 Hematemesis: Secondary | ICD-10-CM | POA: Diagnosis not present

## 2018-01-06 DIAGNOSIS — E876 Hypokalemia: Secondary | ICD-10-CM | POA: Diagnosis not present

## 2018-01-06 DIAGNOSIS — I8511 Secondary esophageal varices with bleeding: Secondary | ICD-10-CM | POA: Diagnosis not present

## 2018-01-06 DIAGNOSIS — F1010 Alcohol abuse, uncomplicated: Secondary | ICD-10-CM | POA: Diagnosis present

## 2018-01-06 DIAGNOSIS — K3189 Other diseases of stomach and duodenum: Secondary | ICD-10-CM | POA: Diagnosis not present

## 2018-01-06 DIAGNOSIS — Z87891 Personal history of nicotine dependence: Secondary | ICD-10-CM | POA: Diagnosis not present

## 2018-01-06 DIAGNOSIS — J9601 Acute respiratory failure with hypoxia: Secondary | ICD-10-CM | POA: Diagnosis not present

## 2018-01-06 DIAGNOSIS — Z765 Malingerer [conscious simulation]: Secondary | ICD-10-CM | POA: Diagnosis not present

## 2018-01-06 DIAGNOSIS — I361 Nonrheumatic tricuspid (valve) insufficiency: Secondary | ICD-10-CM | POA: Diagnosis not present

## 2018-01-06 DIAGNOSIS — J449 Chronic obstructive pulmonary disease, unspecified: Secondary | ICD-10-CM | POA: Diagnosis present

## 2018-01-06 DIAGNOSIS — Z978 Presence of other specified devices: Secondary | ICD-10-CM | POA: Diagnosis not present

## 2018-01-06 DIAGNOSIS — R579 Shock, unspecified: Secondary | ICD-10-CM

## 2018-01-06 DIAGNOSIS — D62 Acute posthemorrhagic anemia: Secondary | ICD-10-CM | POA: Diagnosis not present

## 2018-01-06 HISTORY — DX: Esophageal varices without bleeding: I85.00

## 2018-01-06 HISTORY — PX: ESOPHAGOGASTRODUODENOSCOPY: SHX5428

## 2018-01-06 HISTORY — DX: Nontraumatic subarachnoid hemorrhage, unspecified: I60.9

## 2018-01-06 LAB — PROTIME-INR
INR: 1.39
Prothrombin Time: 17 seconds — ABNORMAL HIGH (ref 11.4–15.2)

## 2018-01-06 LAB — BLOOD GAS, ARTERIAL
ACID-BASE DEFICIT: 4.3 mmol/L — AB (ref 0.0–2.0)
BICARBONATE: 21 mmol/L (ref 20.0–28.0)
DRAWN BY: 234301
FIO2: 50
LHR: 16 {breaths}/min
MECHVT: 520 mL
O2 SAT: 99.2 %
PEEP/CPAP: 5 cmH2O
PH ART: 7.37 (ref 7.350–7.450)
Patient temperature: 37
pCO2 arterial: 35.5 mmHg (ref 32.0–48.0)
pO2, Arterial: 165 mmHg — ABNORMAL HIGH (ref 83.0–108.0)

## 2018-01-06 LAB — CBC WITH DIFFERENTIAL/PLATELET
Basophils Absolute: 0.1 10*3/uL (ref 0.0–0.1)
Basophils Relative: 1 %
EOS PCT: 1 %
Eosinophils Absolute: 0 10*3/uL (ref 0.0–0.7)
HCT: 36.7 % — ABNORMAL LOW (ref 39.0–52.0)
Hemoglobin: 11.8 g/dL — ABNORMAL LOW (ref 13.0–17.0)
LYMPHS ABS: 1.4 10*3/uL (ref 0.7–4.0)
LYMPHS PCT: 17 %
MCH: 29 pg (ref 26.0–34.0)
MCHC: 32.2 g/dL (ref 30.0–36.0)
MCV: 90.2 fL (ref 78.0–100.0)
Monocytes Absolute: 0.9 10*3/uL (ref 0.1–1.0)
Monocytes Relative: 11 %
Neutro Abs: 5.7 10*3/uL (ref 1.7–7.7)
Neutrophils Relative %: 70 %
PLATELETS: 158 10*3/uL (ref 150–400)
RBC: 4.07 MIL/uL — ABNORMAL LOW (ref 4.22–5.81)
RDW: 16.3 % — AB (ref 11.5–15.5)
WBC: 8.1 10*3/uL (ref 4.0–10.5)

## 2018-01-06 LAB — COMPREHENSIVE METABOLIC PANEL
ALT: 23 U/L (ref 17–63)
AST: 44 U/L — ABNORMAL HIGH (ref 15–41)
Albumin: 3.7 g/dL (ref 3.5–5.0)
Alkaline Phosphatase: 125 U/L (ref 38–126)
Anion gap: 15 (ref 5–15)
BUN: 23 mg/dL — ABNORMAL HIGH (ref 6–20)
CALCIUM: 9.1 mg/dL (ref 8.9–10.3)
CHLORIDE: 101 mmol/L (ref 101–111)
CO2: 22 mmol/L (ref 22–32)
CREATININE: 0.53 mg/dL — AB (ref 0.61–1.24)
Glucose, Bld: 111 mg/dL — ABNORMAL HIGH (ref 65–99)
Potassium: 3.6 mmol/L (ref 3.5–5.1)
Sodium: 138 mmol/L (ref 135–145)
Total Bilirubin: 1.6 mg/dL — ABNORMAL HIGH (ref 0.3–1.2)
Total Protein: 7.4 g/dL (ref 6.5–8.1)

## 2018-01-06 LAB — MRSA PCR SCREENING: MRSA BY PCR: NEGATIVE

## 2018-01-06 LAB — HEMOGLOBIN AND HEMATOCRIT, BLOOD
HCT: 29.6 % — ABNORMAL LOW (ref 39.0–52.0)
HEMOGLOBIN: 9.8 g/dL — AB (ref 13.0–17.0)

## 2018-01-06 LAB — POC OCCULT BLOOD, ED: Fecal Occult Bld: POSITIVE — AB

## 2018-01-06 LAB — ABO/RH: ABO/RH(D): O NEG

## 2018-01-06 LAB — PREPARE RBC (CROSSMATCH)

## 2018-01-06 LAB — LIPASE, BLOOD: LIPASE: 25 U/L (ref 11–51)

## 2018-01-06 LAB — TRIGLYCERIDES: TRIGLYCERIDES: 101 mg/dL (ref <150)

## 2018-01-06 SURGERY — EGD (ESOPHAGOGASTRODUODENOSCOPY)
Anesthesia: General

## 2018-01-06 MED ORDER — LACTATED RINGERS IV SOLN: INTRAVENOUS | Status: DC

## 2018-01-06 MED ORDER — THIAMINE HCL 100 MG/ML IJ SOLN: 100.0000 mg | Freq: Every day | INTRAMUSCULAR | Status: DC

## 2018-01-06 MED ORDER — PANTOPRAZOLE SODIUM 40 MG IV SOLR: 40.0000 mg | Freq: Two times a day (BID) | INTRAVENOUS | Status: DC

## 2018-01-06 MED ORDER — MEPERIDINE HCL 25 MG/ML IJ SOLN: 6.2500 mg | INTRAMUSCULAR | Status: DC | PRN

## 2018-01-06 MED ORDER — SODIUM CHLORIDE 0.9 % IV SOLN
0.0000 mg/h | INTRAVENOUS | Status: DC
  Administered 2018-01-06: 2 mg/h via INTRAVENOUS
  Administered 2018-01-07: 5 mg/h via INTRAVENOUS
  Filled 2018-01-06: qty 10

## 2018-01-06 MED ORDER — ACETAMINOPHEN 650 MG RE SUPP: 650.0000 mg | Freq: Four times a day (QID) | RECTAL | Status: DC | PRN

## 2018-01-06 MED ORDER — POTASSIUM CHLORIDE IN NACL 20-0.9 MEQ/L-% IV SOLN
INTRAVENOUS | Status: DC
  Administered 2018-01-06: 21:00:00 via INTRAVENOUS

## 2018-01-06 MED ORDER — LACTATED RINGERS IV BOLUS
1000.0000 mL | Freq: Once | INTRAVENOUS | Status: AC
  Administered 2018-01-06: 1000 mL via INTRAVENOUS

## 2018-01-06 MED ORDER — SODIUM CHLORIDE 0.9 % IV SOLN
1.0000 g | INTRAVENOUS | Status: DC
Start: 2018-01-07 — End: 2018-01-07
  Filled 2018-01-06 (×4): qty 10

## 2018-01-06 MED ORDER — SODIUM CHLORIDE 0.9 % IV SOLN
50.0000 ug/h | INTRAVENOUS | Status: DC
  Administered 2018-01-06: 50 ug/h via INTRAVENOUS
  Filled 2018-01-06 (×3): qty 1

## 2018-01-06 MED ORDER — LORAZEPAM 1 MG PO TABS
1.0000 mg | ORAL_TABLET | Freq: Four times a day (QID) | ORAL | Status: AC | PRN
  Administered 2018-01-08: 1 mg via ORAL
  Filled 2018-01-06: qty 1

## 2018-01-06 MED ORDER — ADULT MULTIVITAMIN W/MINERALS CH
1.0000 | ORAL_TABLET | Freq: Every day | ORAL | Status: DC
  Administered 2018-01-08: 1 via ORAL
  Filled 2018-01-06: qty 1

## 2018-01-06 MED ORDER — SODIUM CHLORIDE 0.9 % IV SOLN
50.0000 ug/h | INTRAVENOUS | Status: DC
  Administered 2018-01-07 – 2018-01-10 (×7): 50 ug/h via INTRAVENOUS
  Filled 2018-01-06 (×12): qty 1

## 2018-01-06 MED ORDER — ADULT MULTIVITAMIN W/MINERALS CH
1.0000 | ORAL_TABLET | Freq: Every day | ORAL | Status: DC
  Administered 2018-01-08 – 2018-01-10 (×3): 1 via ORAL
  Filled 2018-01-06 (×3): qty 1

## 2018-01-06 MED ORDER — SODIUM CHLORIDE 0.9 % IV SOLN
Freq: Once | INTRAVENOUS | Status: AC
  Administered 2018-01-06: 14:00:00 via INTRAVENOUS

## 2018-01-06 MED ORDER — FENTANYL CITRATE (PF) 100 MCG/2ML IJ SOLN
INTRAMUSCULAR | Status: AC
  Administered 2018-01-06: 50 ug
  Filled 2018-01-06: qty 2

## 2018-01-06 MED ORDER — PROPOFOL 1000 MG/100ML IV EMUL
INTRAVENOUS | Status: AC
Start: 2018-01-06 — End: 2018-01-07
  Filled 2018-01-06: qty 100

## 2018-01-06 MED ORDER — PANTOPRAZOLE SODIUM 40 MG IV SOLR
8.0000 mg/h | INTRAVENOUS | Status: DC
  Administered 2018-01-06: 8 mg/h via INTRAVENOUS
  Filled 2018-01-06 (×3): qty 80

## 2018-01-06 MED ORDER — MIDAZOLAM 50MG/50ML (1MG/ML) PREMIX INFUSION
INTRAVENOUS | Status: AC
  Filled 2018-01-06: qty 50

## 2018-01-06 MED ORDER — CHLORHEXIDINE GLUCONATE 0.12% ORAL RINSE (MEDLINE KIT)
15.0000 mL | Freq: Two times a day (BID) | OROMUCOSAL | Status: DC
  Administered 2018-01-06 – 2018-01-07 (×2): 15 mL via OROMUCOSAL

## 2018-01-06 MED ORDER — SODIUM CHLORIDE 0.9 % IV SOLN
1.0000 g | Freq: Once | INTRAVENOUS | Status: AC
  Administered 2018-01-06: 1 g via INTRAVENOUS
  Filled 2018-01-06: qty 10

## 2018-01-06 MED ORDER — FOLIC ACID 1 MG PO TABS
1.0000 mg | ORAL_TABLET | Freq: Every day | ORAL | Status: DC
  Administered 2018-01-08 – 2018-01-10 (×3): 1 mg via ORAL
  Filled 2018-01-06 (×3): qty 1

## 2018-01-06 MED ORDER — FENTANYL CITRATE (PF) 100 MCG/2ML IJ SOLN
INTRAMUSCULAR | Status: AC
  Filled 2018-01-06: qty 4

## 2018-01-06 MED ORDER — GABAPENTIN 300 MG PO CAPS: 300.0000 mg | ORAL_CAPSULE | ORAL | Status: DC

## 2018-01-06 MED ORDER — ONDANSETRON HCL 4 MG PO TABS: 4.0000 mg | ORAL_TABLET | Freq: Four times a day (QID) | ORAL | Status: DC | PRN

## 2018-01-06 MED ORDER — LIDOCAINE HCL (PF) 1 % IJ SOLN
INTRAMUSCULAR | Status: AC
  Filled 2018-01-06: qty 5

## 2018-01-06 MED ORDER — HYDROMORPHONE HCL 1 MG/ML IJ SOLN: 0.2500 mg | INTRAMUSCULAR | Status: DC | PRN

## 2018-01-06 MED ORDER — PROPOFOL 10 MG/ML IV BOLUS
INTRAVENOUS | Status: AC
  Filled 2018-01-06: qty 80

## 2018-01-06 MED ORDER — PROMETHAZINE HCL 25 MG/ML IJ SOLN: 6.2500 mg | INTRAMUSCULAR | Status: DC | PRN

## 2018-01-06 MED ORDER — ACETAMINOPHEN 325 MG PO TABS
650.0000 mg | ORAL_TABLET | Freq: Four times a day (QID) | ORAL | Status: DC | PRN
  Administered 2018-01-10: 650 mg via ORAL
  Filled 2018-01-06: qty 2

## 2018-01-06 MED ORDER — ONDANSETRON HCL 4 MG/2ML IJ SOLN: 4.0000 mg | Freq: Four times a day (QID) | INTRAMUSCULAR | Status: DC | PRN

## 2018-01-06 MED ORDER — CHLORHEXIDINE GLUCONATE 0.12% ORAL RINSE (MEDLINE KIT): 15.0000 mL | Freq: Two times a day (BID) | OROMUCOSAL | Status: DC

## 2018-01-06 MED ORDER — HYDROCODONE-ACETAMINOPHEN 7.5-325 MG PO TABS: 1.0000 | ORAL_TABLET | Freq: Once | ORAL | Status: DC | PRN

## 2018-01-06 MED ORDER — FENTANYL CITRATE (PF) 250 MCG/5ML IJ SOLN
INTRAMUSCULAR | Status: DC | PRN
  Administered 2018-01-06 (×2): 50 ug via INTRAVENOUS

## 2018-01-06 MED ORDER — MIDAZOLAM BOLUS VIA INFUSION
1.0000 mg | INTRAVENOUS | Status: DC | PRN
  Filled 2018-01-06: qty 2

## 2018-01-06 MED ORDER — SODIUM CHLORIDE 0.9 % IV SOLN
80.0000 mg | Freq: Once | INTRAVENOUS | Status: AC
  Administered 2018-01-06: 80 mg via INTRAVENOUS
  Filled 2018-01-06: qty 80

## 2018-01-06 MED ORDER — MIDAZOLAM HCL 2 MG/2ML IJ SOLN
INTRAMUSCULAR | Status: AC
  Filled 2018-01-06: qty 4

## 2018-01-06 MED ORDER — FUROSEMIDE 80 MG PO TABS
80.0000 mg | ORAL_TABLET | Freq: Every day | ORAL | Status: DC
  Administered 2018-01-08 – 2018-01-10 (×3): 80 mg via ORAL
  Filled 2018-01-06 (×3): qty 1

## 2018-01-06 MED ORDER — SODIUM CHLORIDE 0.9 % IV SOLN: 50.0000 ug/h | INTRAVENOUS | Status: DC

## 2018-01-06 MED ORDER — SUCCINYLCHOLINE CHLORIDE 20 MG/ML IJ SOLN
INTRAMUSCULAR | Status: DC | PRN
  Administered 2018-01-06: 120 mg via INTRAVENOUS

## 2018-01-06 MED ORDER — LACTULOSE 10 GM/15ML PO SOLN
30.0000 g | Freq: Three times a day (TID) | ORAL | Status: DC
  Administered 2018-01-08 – 2018-01-10 (×4): 30 g via ORAL
  Filled 2018-01-06 (×6): qty 60

## 2018-01-06 MED ORDER — PROPOFOL 10 MG/ML IV BOLUS
INTRAVENOUS | Status: DC | PRN
  Administered 2018-01-06: 180 mg via INTRAVENOUS

## 2018-01-06 MED ORDER — AMITRIPTYLINE HCL 25 MG PO TABS
25.0000 mg | ORAL_TABLET | Freq: Every evening | ORAL | Status: DC | PRN
  Administered 2018-01-09: 25 mg via ORAL
  Filled 2018-01-06: qty 1

## 2018-01-06 MED ORDER — SUCCINYLCHOLINE CHLORIDE 20 MG/ML IJ SOLN
INTRAMUSCULAR | Status: AC
  Filled 2018-01-06: qty 2

## 2018-01-06 MED ORDER — OCTREOTIDE LOAD VIA INFUSION
50.0000 ug | Freq: Once | INTRAVENOUS | Status: DC
  Filled 2018-01-06: qty 25

## 2018-01-06 MED ORDER — FENTANYL CITRATE (PF) 100 MCG/2ML IJ SOLN
50.0000 ug | INTRAMUSCULAR | Status: DC | PRN
  Administered 2018-01-06: 50 ug via INTRAVENOUS

## 2018-01-06 MED ORDER — EPHEDRINE SULFATE 50 MG/ML IJ SOLN
INTRAMUSCULAR | Status: AC
  Filled 2018-01-06: qty 1

## 2018-01-06 MED ORDER — MIDAZOLAM HCL 2 MG/2ML IJ SOLN
INTRAMUSCULAR | Status: AC
  Filled 2018-01-06: qty 2

## 2018-01-06 MED ORDER — LORAZEPAM 2 MG/ML IJ SOLN
1.0000 mg | Freq: Four times a day (QID) | INTRAMUSCULAR | Status: AC | PRN
  Administered 2018-01-07 – 2018-01-09 (×6): 1 mg via INTRAVENOUS
  Filled 2018-01-06 (×7): qty 1

## 2018-01-06 MED ORDER — DEXMEDETOMIDINE BOLUS VIA INFUSION
1.0000 ug/kg | Freq: Once | INTRAVENOUS | Status: DC
  Filled 2018-01-06: qty 84

## 2018-01-06 MED ORDER — ORAL CARE MOUTH RINSE
15.0000 mL | OROMUCOSAL | Status: DC
  Administered 2018-01-06 – 2018-01-07 (×5): 15 mL via OROMUCOSAL

## 2018-01-06 MED ORDER — LACTATED RINGERS IV SOLN
INTRAVENOUS | Status: DC
  Administered 2018-01-06: 17:00:00 via INTRAVENOUS

## 2018-01-06 MED ORDER — PROPOFOL 1000 MG/100ML IV EMUL
0.0000 ug/kg/min | INTRAVENOUS | Status: DC
  Administered 2018-01-06 – 2018-01-07 (×2): 50 ug/kg/min via INTRAVENOUS
  Administered 2018-01-07: 35 ug/kg/min via INTRAVENOUS
  Filled 2018-01-06 (×2): qty 200

## 2018-01-06 MED ORDER — ROCURONIUM BROMIDE 100 MG/10ML IV SOLN
INTRAVENOUS | Status: DC | PRN
  Administered 2018-01-06 (×2): 20 mg via INTRAVENOUS

## 2018-01-06 MED ORDER — PROPOFOL 1000 MG/100ML IV EMUL
0.0000 ug/kg/min | INTRAVENOUS | Status: DC
  Administered 2018-01-06: 5 ug/kg/min via INTRAVENOUS

## 2018-01-06 MED ORDER — PANTOPRAZOLE SODIUM 40 MG IV SOLR
8.0000 mg/h | INTRAVENOUS | Status: DC
  Administered 2018-01-07 – 2018-01-09 (×5): 8 mg/h via INTRAVENOUS
  Filled 2018-01-06 (×8): qty 80

## 2018-01-06 MED ORDER — SODIUM CHLORIDE 0.9 % IV SOLN
1.0000 g | INTRAVENOUS | Status: DC
  Administered 2018-01-06 – 2018-01-09 (×4): 1 g via INTRAVENOUS
  Filled 2018-01-06 (×7): qty 10

## 2018-01-06 MED ORDER — MIDAZOLAM HCL 2 MG/2ML IJ SOLN
INTRAMUSCULAR | Status: DC | PRN
  Administered 2018-01-06: 2 mg via INTRAVENOUS

## 2018-01-06 MED ORDER — MIDAZOLAM HCL 2 MG/2ML IJ SOLN
4.0000 mg | INTRAMUSCULAR | Status: DC | PRN
  Administered 2018-01-06: 4 mg via INTRAVENOUS

## 2018-01-06 MED ORDER — SODIUM CHLORIDE 0.9 % IV SOLN
INTRAVENOUS | Status: DC
  Administered 2018-01-06: 1000 mL via INTRAVENOUS

## 2018-01-06 MED ORDER — DEXMEDETOMIDINE HCL IN NACL 200 MCG/50ML IV SOLN: 0.4000 ug/kg/h | INTRAVENOUS | Status: DC

## 2018-01-06 MED ORDER — VITAMIN B-1 100 MG PO TABS
100.0000 mg | ORAL_TABLET | Freq: Every day | ORAL | Status: DC
Start: 2018-01-06 — End: 2018-01-10
  Administered 2018-01-08 – 2018-01-10 (×3): 100 mg via ORAL
  Filled 2018-01-06 (×3): qty 1

## 2018-01-06 MED ORDER — ROCURONIUM BROMIDE 50 MG/5ML IV SOLN
INTRAVENOUS | Status: AC
  Filled 2018-01-06: qty 1

## 2018-01-06 MED ORDER — SODIUM CHLORIDE 0.9 % IV SOLN
8.0000 mg | Freq: Once | INTRAVENOUS | Status: AC
  Administered 2018-01-06: 8 mg via INTRAVENOUS
  Filled 2018-01-06: qty 4

## 2018-01-06 MED ORDER — ORAL CARE MOUTH RINSE: 15.0000 mL | Freq: Four times a day (QID) | OROMUCOSAL | Status: DC

## 2018-01-06 NOTE — Progress Notes (Signed)
Per Dr. Jena Gaussourk and one day surgery staff, pt is hemodynamically stable. Dr. Luvenia Starchourk's recommendation to keep pt intubated to maintain airway access  in case varices rupture. Propofol currently running at 50 mcg/kg/min. 50 mcg fentanyl pushed at 1615 with no change in patient's agitation. 4 mg versed pushed at 1630. Pt resting and tolerating ventilator well. Slight movement noted. Versed gtt ordered. Will continue to monitor patient

## 2018-01-06 NOTE — Anesthesia Postprocedure Evaluation (Signed)
Anesthesia Post Note  Patient: Joseph Cortez  Procedure(s) Performed: ESOPHAGOGASTRODUODENOSCOPY (EGD) (N/A )  Patient location during evaluation: ICU Anesthesia Type: General Level of consciousness: sedated Pain management: pain level controlled Vital Signs Assessment: post-procedure vital signs reviewed and stable Respiratory status: patient remains intubated per anesthesia plan Cardiovascular status: blood pressure returned to baseline Postop Assessment: no apparent nausea or vomiting Anesthetic complications: no     Last Vitals:  Vitals:   01/06/18 1420 01/06/18 1425  BP: 111/73 107/72  Pulse:    Resp: 19 19  Temp:    SpO2: 97%     Last Pain:  Vitals:   01/06/18 1409  TempSrc:   PainSc: 0-No pain                 Karesa Maultsby

## 2018-01-06 NOTE — Transfer of Care (Signed)
Immediate Anesthesia Transfer of Care Note  Patient: Cristopher EstimableMichael Lueth  Procedure(s) Performed: ESOPHAGOGASTRODUODENOSCOPY (EGD) (N/A )  Patient Location: ICU  Anesthesia Type:General  Level of Consciousness: sedated  Airway & Oxygen Therapy: Patient Spontanous Breathing, Patient remains intubated per anesthesia plan and Patient placed on Ventilator (see vital sign flow sheet for setting)  Post-op Assessment: Report given to RN and Post -op Vital signs reviewed and stable  Post vital signs: Reviewed and stable  Last Vitals:  Vitals Value Taken Time  BP    Temp    Pulse    Resp    SpO2      Last Pain:  Vitals:   01/06/18 1409  TempSrc:   PainSc: 0-No pain      Patients Stated Pain Goal: 9 (01/06/18 1409)  Complications: No apparent anesthesia complications

## 2018-01-06 NOTE — H&P (Addendum)
History and Physical  Joseph Cortez:[id number] DOB: 10/19/1965 DOA: 01/06/2018   PCP: Clinton Sawyer, MD   Patient coming from: Home  Chief Complaint: hematemesis  HPI:  Joseph Cortez is a 52 y.o. male with medical history of alcoholic cirrhosis, tobacco abuse, COPD, esophageal varices presenting with hematemesis that began around 2 AM on 01/06/2018.  The patient had been in his usual state of health without any major complaints until 2 AM on 01/06/2018 when he woke up with some nausea and emesis.  The patient denied any fevers, chills, chest pain, shortness breath, but he had complained of some dizziness without any syncope.  He denies any frank melena, but states that he has been passing black stools.  He states that since he started vomiting, he has noticed some left upper quadrant and epigastric abdominal pain.  He denies any dysuria, hematuria, hematochezia.  He denies any NSAIDs.  He denies any new medications.  Notably, the patient was recently seen by general surgery in [facility], there are plans for elective surgery for his right inguinal hernia.  He otherwise endorses compliance with all his other medications.  The patient had been following GI at [place], [place]. he was last seen on December 30, 2017.  He was prescribed Antabuse.  He was advised to restart on furosemide 40 mg daily and spironolactone 100 mg daily.  In the emergency department, the patient was noted to have a low-grade temperature of 99.2 F with tachycardia in 110s and soft blood pressure 103/85.  BMP and LFTs were essentially unremarkable.  Hemoglobin was 11.8.  INR was 1.39.  FOBT was positive.  Urinalysis was negative for pyuria.  Assessment/Plan: Acute blood loss anemia/symptomatic anemia -Secondary to upper GI bleed -His baseline hemoglobin ~11 on Care Everywhere -Type and screen  Upper GI bleed/variceal bleed -GI consulted--> planning for EGD 01/06/2018 afternoon -Continue ceftriaxone -Continue  octreotide -Continue Protonix drip -03/15/2017 EGD--4 columns of grade 2 esophageal varices, banded -05/24/2017 colonoscopy--internal hemorrhoids, diverticulosis -Remain n.p.o. -IV fluids  Alcoholic liver cirrhosis with ascites -Patient had been sober since January 2019, but has drank last few days -Continue thiamine -12/10/2017 alpha-fetoprotein--2.9 -Holding furosemide and spironolactone secondary to soft blood pressure -restart lactulose when able -CT abdomen pelvis with and without contrast December 29, 2017 at outside facility showed hepatic cirrhosis without HCC, signs of portal venous hypertension with portal colopathy and potential portal gastropathy, small volume ascites.  Large right inguinal hernia with bowel contents extending into the right hemiscrotum. -last paracentesis on 11/05/17 in [place]  COPD -stable on RA  Thrombocytopenia -chronic and stable -due to liver cirrhosis  LUQ/Epigastric abdominal pain -check lipase -CT abd if no improvement  Alcohol abuse -drank last few days prior to admission -CIWA protocol   Past Medical History:  Diagnosis Date  . AKI (acute kidney injury) (HCC) 07/14/2016  . Anemia   . Anxiety   . Arthritis   . Blood transfusion without reported diagnosis   . Cirrhosis (HCC)   . COPD (chronic obstructive pulmonary disease) (HCC)   . Depression   . Dyspnea   . Esophageal varices (HCC)   . Hypokalemia 07/14/2016  . Hyponatremia 07/14/2016  . Neuromuscular disorder (HCC)    Neuropathy  . Neuropathy   . Substance abuse (HCC)   . Ulcer    BLEEDING VARICES, ULCERATIONS   Past Surgical History:  Procedure Laterality Date  . BIOPSY  06/27/2015   Procedure: BIOPSY (Gastric);  Surgeon: Corbin Ade, MD;  Location:  [facility];  Service: Endoscopy;;  . CHOLECYSTECTOMY    . COLONOSCOPY WITH PROPOFOL N/A 05/24/2017   Procedure: COLONOSCOPY WITH PROPOFOL;  Surgeon: Corbin Ade, MD;  Location: [facility];  Service: Endoscopy;  Laterality:  N/A;  8:30am  . ESOPHAGEAL BANDING N/A 01/30/2016   Procedure: ESOPHAGEAL BANDING;  Surgeon: Corbin Ade, MD;  Location: [facility];  Service: Endoscopy;  Laterality: N/A;  . ESOPHAGOGASTRODUODENOSCOPY  January 06, 2016   Dr. Alycia Rossetti at [facility]: duodenitis in bullb, portal gastropathy, 2 columns of large distal esophageal varices, one with flat red spot, s/p band ligation. Needs 4 week surveillance EGD   . ESOPHAGOGASTRODUODENOSCOPY (EGD) WITH PROPOFOL N/A 06/27/2015   Cortez:WRUE erosive reflux esophagitis, portal gastropathy, duodenal bulbar ulcer, negative H.pylori. Surveillance due yearly according to ASGE current guidelines regarding ETOH cirrhosis and ongoing ETOH abuse  . ESOPHAGOGASTRODUODENOSCOPY (EGD) WITH PROPOFOL N/A 01/30/2016   Dr. Jena Gauss: Grade 2 varices s/p banding with complete deflation of varices, portal gastropathy, surveillance in May 2018  . ESOPHAGOGASTRODUODENOSCOPY (EGD) WITH PROPOFOL N/A 03/15/2017   4 columns of Grade 2 varices s/p banding and completely eradicated. cherry red spots overlying one of the columns distally. portal gastropathy. EGD in 1.5 years   . UMBILICAL HERNIA REPAIR N/A 07/06/2017   Procedure: REDUCTION AND REPAIR OF STRANGULATED UMBILICAL HERNIA;  Surgeon: Kinsinger, De Blanch, MD;  Location: [facility] OR;  Service: General;  Laterality: N/A;   Social History:  reports that he quit smoking about 3 months ago. His smoking use included cigarettes. He has never used smokeless tobacco. He reports that he drinks alcohol. He reports that he does not use drugs.   Family History  Problem Relation Age of Onset  . Colon cancer Maternal Uncle   . Alcohol abuse Maternal Uncle   . Arthritis Mother   . Cancer Mother        breast cancer  . Hypertension Father   . Hyperlipidemia Father   . Lupus Sister   . Alcohol abuse Paternal Uncle   . Alzheimer's disease Maternal Grandmother   . COPD Maternal Grandfather   . Diabetes Maternal Grandfather   . Heart disease Paternal  Grandfather   . Alcohol abuse Cousin      Allergies  Allergen Reactions  . Sertraline Other (See Comments)    Suicidal ideations     Prior to Admission medications   Medication Sig Start Date End Date Taking? Authorizing Provider  acetaminophen (TYLENOL) 650 MG CR tablet Take 650 mg by mouth every 8 (eight) hours as needed for pain.    [provider]  amitriptyline (ELAVIL) 25 MG tablet Take 1 tablet as needed by mouth.  04/19/17   [provider]  B Complex-Biotin-FA (B COMPLETE PO) Take 1 tablet by mouth daily.    [provider]  bismuth subsalicylate (PEPTO BISMOL) 262 MG/15ML suspension Take 30 mLs by mouth every 6 (six) hours as needed (for diarrhea/loose stools or upset stomach.).     [provider]  furosemide (LASIX) 80 MG tablet Take 1 tablet by mouth daily. 06/28/17   [provider]  gabapentin (NEURONTIN) 300 MG capsule Take 300-600 mg by mouth See admin instructions. 300mg  in the morning, 300mg  at 4 pm, and 600mg  at bedtime    [provider]  lactulose (CHRONULAC) 10 GM/15ML solution TAKE 45 ML BY MOUTH 2 to 3 times daily 04/01/17   Tiffany Kocher, PA-C  LORazepam (ATIVAN) 1 MG tablet Take 1 tablet (1 mg total) by  mouth See admin instructions. 2 mg every 6 hours for 4 doses, then 1 mg every 6 hours for 8 additional doses 09/13/17   Lavera GuiseLiu, Dana Duo, MD  Multiple Vitamin (MULTIVITAMIN WITH MINERALS) TABS tablet Take 1 tablet by mouth daily.    [provider]  ondansetron (ZOFRAN) 4 MG tablet Take 1 tablet (4 mg total) by mouth every 8 (eight) hours as needed for nausea or vomiting. 06/02/17   Gelene MinkBoone, Anna W, NP  pantoprazole (PROTONIX) 40 MG tablet Take 1 tablet (40 mg total) by mouth 2 (two) times daily before a meal. Patient taking differently: Take 40 mg by mouth daily.  01/27/17   Gelene MinkBoone, Anna W, NP  spironolactone (ALDACTONE) 100 MG tablet Take 2 tablets by mouth daily. 06/28/17   [provider]  Thiamine HCl  (VITAMIN B-1 PO) Take 1 tablet by mouth daily.    [provider]    Review of Systems:  Constitutional:  No weight loss, night sweats, Fevers, chills, fatigue.  Head&Eyes: No headache.  No vision loss.  No eye pain or scotoma ENT:  No Difficulty swallowing,Tooth/dental problems,Sore throat,  No ear ache, post nasal drip,  Cardio-vascular:  No chest pain, Orthopnea, PND, swelling in lower extremities,  dizziness, palpitations  GI:  No  diarrhea, loss of appetite, hematochezia, melena, heartburn, indigestion, Resp:  No shortness of breath with exertion or at rest. No cough. No coughing up of blood .No wheezing.No chest wall deformity  Skin:  no rash or lesions.  GU:  no dysuria, change in color of urine, no urgency or frequency. No flank pain.  Musculoskeletal:  No joint pain or swelling. No decreased range of motion. No back pain.  Psych:  No change in mood or affect. No depression or anxiety. Neurologic: No headache, no dysesthesia, no focal weakness, no vision loss. No syncope  Physical Exam: Vitals:   01/06/18 1101 01/06/18 1341  BP: 103/85 113/84  Pulse: (!) 114 85  Resp: 18 16  Temp: 99.2 F (37.3 C)   TempSrc: Oral   SpO2: 100% 100%   General:  A&O x 3, NAD, nontoxic, pleasant/cooperative Head/Eye: No conjunctival hemorrhage, no icterus, [place]/AT, No nystagmus ENT:  No icterus,  No thrush, good dentition, no pharyngeal exudate Neck:  No masses, no lymphadenpathy, no bruits CV:  RRR, no rub, no gallop, no S3 Lung:  CTAB, good air movement, no wheeze, no rhonchi Abdomen: soft/NT, +BS, nondistended, no peritoneal signs Ext: No cyanosis, No rashes, No petechiae, No lymphangitis, No edema Neuro: CNII-XII intact, strength 4/5 in bilateral upper and lower extremities, no dysmetria  Labs on Admission:  Basic Metabolic Panel: Recent Labs  Lab 01/06/18 1137  NA 138  K 3.6  CL 101  CO2 22  GLUCOSE 111*  BUN 23*  CREATININE 0.53*  CALCIUM 9.1   Liver  Function Tests: Recent Labs  Lab 01/06/18 1137  AST 44*  ALT 23  ALKPHOS 125  BILITOT 1.6*  PROT 7.4  ALBUMIN 3.7   No results for input(s): LIPASE, AMYLASE in the last 168 hours. No results for input(s): AMMONIA in the last 168 hours. CBC: Recent Labs  Lab 01/06/18 1137  WBC 8.1  NEUTROABS 5.7  HGB 11.8*  HCT 36.7*  MCV 90.2  PLT 158   Coagulation Profile: Recent Labs  Lab 01/06/18 1144  INR 1.39   Cardiac Enzymes: No results for input(s): CKTOTAL, CKMB, CKMBINDEX, TROPONINI in the last 168 hours. BNP: Invalid input(s): POCBNP CBG: No results for input(s): GLUCAP  in the last 168 hours. Urine analysis:    Component Value Date/Time   COLORURINE AMBER (A) 09/13/2017 1915   APPEARANCEUR CLEAR 09/13/2017 1915   LABSPEC 1.017 09/13/2017 1915   PHURINE 6.0 09/13/2017 1915   GLUCOSEU NEGATIVE 09/13/2017 1915   HGBUR NEGATIVE 09/13/2017 1915   BILIRUBINUR NEGATIVE 09/13/2017 1915   KETONESUR NEGATIVE 09/13/2017 1915   PROTEINUR NEGATIVE 09/13/2017 1915   NITRITE NEGATIVE 09/13/2017 1915   LEUKOCYTESUR NEGATIVE 09/13/2017 1915   Sepsis Labs: @LABRCNTIP (procalcitonin:4,lacticidven:4) )No results found for this or any previous visit (from the past 240 hour(s)).   Radiological Exams on Admission: No results found.      Time spent:60 minutes Code Status:   FULL Family Communication:  Sister updated at bedside Disposition Plan: expect 2-3 day hospitalization Consults called: GI DVT Prophylaxis:SCDs  Catarina Hartshorn, DO  [facility] Pager [phone]  If 7PM-7AM, please contact night-coverage [email or web address] Password [email or web address] 01/06/2018, 1:49 PM

## 2018-01-06 NOTE — ED Triage Notes (Signed)
Pt c/o n/v/d since 2am. States has hx of esophageal varices and was vmoiting blood. Pt brought in bad with paper towel covered with dark blood and clots. A/o. Ambulatory.

## 2018-01-06 NOTE — Anesthesia Procedure Notes (Signed)
Procedure Name: Intubation Date/Time: 01/06/2018 2:43 PM Performed by: Jonna Munro, CRNA Pre-anesthesia Checklist: Patient identified, Emergency Drugs available, Suction available, Patient being monitored and Timeout performed Patient Re-evaluated:Patient Re-evaluated prior to induction Oxygen Delivery Method: Circle system utilized Preoxygenation: Pre-oxygenation with 100% oxygen Induction Type: IV induction, Rapid sequence and Cricoid Pressure applied Laryngoscope Size: Mac and 3 Grade View: Grade I Tube type: Oral Tube size: 6.0 mm Number of attempts: 1 Airway Equipment and Method: Stylet Placement Confirmation: ETT inserted through vocal cords under direct vision,  positive ETCO2 and breath sounds checked- equal and bilateral Secured at: 22 cm Tube secured with: Tape Dental Injury: Teeth and Oropharynx as per pre-operative assessment
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
## 2018-01-06 NOTE — Op Note (Signed)
[facility] Patient Name: Joseph Cortez Procedure Date: 01/06/2018 1:49 PM MRN: [id number] Date of Birth: 08-02-1966 Attending MD: Gennette Pac , MD CSN: [id number] Age: 52 Admit Type: Outpatient Procedure:                Upper GI endoscopy Indications:              Hematemesis; known cirrhosis and varices Providers:                Gennette Pac, MD, Jannett Celestine, RN, Dyann Ruddle Referring MD:              Medicines:                General Anesthesia Complications:            No immediate complications. Estimated Blood Loss:     Estimated blood loss was minimal. Procedure:                Pre-Anesthesia Assessment:                           - Prior to the procedure, a History and Physical                            was performed, and patient medications and                            allergies were reviewed. The patient's tolerance of                            previous anesthesia was also reviewed. The risks                            and benefits of the procedure and the sedation                            options and risks were discussed with the patient.                            All questions were answered, and informed consent                            was obtained. Prior Anticoagulants: The patient has                            taken no previous anticoagulant or antiplatelet                            agents. ASA Grade Assessment: III - A patient with                            severe systemic disease. After reviewing the risks  and benefits, the patient was deemed in                            satisfactory condition to undergo the procedure.                           After obtaining informed consent, the endoscope was                            passed under direct vision. Throughout the                            procedure, the patient's blood pressure, pulse, and                            oxygen  saturations were monitored continuously. The                            [id number]-[id number] ([id number] scope was introduced through the                            and advanced to the second part of duodenum. The                            upper GI endoscopy was accomplished without                            difficulty. The patient tolerated the procedure                            well. Scope In: 2:47:43 PM Scope Out: 3:08:15 PM Total Procedure Duration: 0 hours 20 minutes 32 seconds  Findings:      Grade II / III varices were found in the lower third of the esophagus       (4) columns. Excoriations as well as [employer] marks on 2 distal columns. No       active bleeding.      Clotted blood was found in the entire examined stomach; apparent       retained food is well. No active bleeding seen in the stomach. Most of       the gastric mucosa not seen well; pylorus patent. Examination of first       and second portion of the duodenum revealed no abnormalities.       Microvasive 7 shot bander loaded up and reintroduced in the esophagus.       One band placed all 4 columns distally. A second band placed on each of       3 columns. Good hemostasis maintained. Impression:               - Grade II/III esophageal varices bleeding stigmata                            status post EBL treatment. Stomach incompletely                            seen  as described above.                           - Clotted blood in the entire stomach. - No                            specimens collected. Moderate Sedation:      Moderate (conscious) sedation was personally administered by an       anesthesia professional. The following parameters were monitored: oxygen       saturation, heart rate, blood pressure, respiratory rate, EKG, adequacy       of pulmonary ventilation, and response to care. Total physician       intraservice time was 18 minutes. Recommendation:           - Patient has a contact number available for                             emergencies. The signs and symptoms of potential                            delayed complications were discussed with the                            patient. Return to normal activities tomorrow.                            Written discharge instructions were provided to the                            patient.                           - Return patient to ICU for ongoing care.                           - NPO.                           - Continue present medications. Patient should                            remain intubated overnight and consider weaning                            tomorrowning if he does well. Continue IV                            antibiotics, Sandostatin infusion. Repeat EGD 2-3                            weeks. Would withhold packed RBC transfusion unless                            hemoglobin drops below 8.                           -  Repeat upper endoscopy in 3 weeks for                            surveillance.                           - Return to GI clinic (date not yet determined). Procedure Code(s):        --- Professional ---                           [phone], Esophagogastroduodenoscopy, flexible,                            transoral; diagnostic, including collection of                            specimen(s) by brushing or washing, when performed                            (separate procedure) Diagnosis Code(s):        --- Professional ---                           I85.00, Esophageal varices without bleeding                           K92.2, Gastrointestinal hemorrhage, unspecified                           K92.0, Hematemesis CPT copyright 2017 American Medical Association. All rights reserved. The codes documented in this report are preliminary and upon coder review may  be revised to meet current compliance requirements. Gerrit Friends. Maceo Hernan, MD Gennette Pac, MD 01/06/2018 3:53:28 PM This report has been signed electronically. Number of  Addenda: 0
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
## 2018-01-06 NOTE — ED Notes (Signed)
Pam, Endo RN at bedside to transport pt to Endo for EGD.

## 2018-01-06 NOTE — ED Notes (Signed)
Dr Tat at bedside 

## 2018-01-06 NOTE — Progress Notes (Addendum)
Per GI--wanted to keep pt intubated on vent overnight -called by RN due to agitation multiple times -started on propofol -fentanyl push with little improvement -started versed 4 mg IV-->improved agitation -started versed drip -updated sister at bedside  VSS--remains hemodynamically stable  -CV--RRR -lung--bibasilar rales -abd--soft/ND  Consulted pulmonary to help with wean and extubation given pt hx of tracheostomy and prolonged vent support in Jan 2019.  Total time 31 min beyond H&P--The patient is critically ill with multiple organ systems failure and requires high complexity decision making for assessment and support, frequent evaluation and titration of therapies, application of advanced monitoring technologies and extensive interpretation of multiple databases.  Critical care time - 31 mins.   DTat

## 2018-01-06 NOTE — Progress Notes (Signed)
MD notifed of pt not voiding since admission to unit and of bladder scan results. New order received for foley

## 2018-01-07 ENCOUNTER — Other Ambulatory Visit: Payer: Self-pay

## 2018-01-07 DIAGNOSIS — K922 Gastrointestinal hemorrhage, unspecified: Secondary | ICD-10-CM

## 2018-01-07 DIAGNOSIS — K7031 Alcoholic cirrhosis of liver with ascites: Secondary | ICD-10-CM

## 2018-01-07 LAB — BLOOD GAS, ARTERIAL
Acid-base deficit: 0.7 mmol/L (ref 0.0–2.0)
Bicarbonate: 24.2 mmol/L (ref 20.0–28.0)
DRAWN BY: 234301
FIO2: 40
MODE: POSITIVE
O2 Saturation: 98.8 %
PEEP: 5 cmH2O
Patient temperature: 37
Pressure support: 5 cmH2O
pCO2 arterial: 33.4 mmHg (ref 32.0–48.0)
pH, Arterial: 7.449 (ref 7.350–7.450)
pO2, Arterial: 124 mmHg — ABNORMAL HIGH (ref 83.0–108.0)

## 2018-01-07 LAB — CBC
HCT: 32 % — ABNORMAL LOW (ref 39.0–52.0)
HEMOGLOBIN: 10.3 g/dL — AB (ref 13.0–17.0)
MCH: 29.6 pg (ref 26.0–34.0)
MCHC: 32.2 g/dL (ref 30.0–36.0)
MCV: 92 fL (ref 78.0–100.0)
Platelets: 89 10*3/uL — ABNORMAL LOW (ref 150–400)
RBC: 3.48 MIL/uL — ABNORMAL LOW (ref 4.22–5.81)
RDW: 16.4 % — AB (ref 11.5–15.5)
WBC: 4.4 10*3/uL (ref 4.0–10.5)

## 2018-01-07 LAB — COMPREHENSIVE METABOLIC PANEL
ALT: 17 U/L (ref 17–63)
ANION GAP: 10 (ref 5–15)
AST: 36 U/L (ref 15–41)
Albumin: 3.1 g/dL — ABNORMAL LOW (ref 3.5–5.0)
Alkaline Phosphatase: 99 U/L (ref 38–126)
BUN: 18 mg/dL (ref 6–20)
CHLORIDE: 106 mmol/L (ref 101–111)
CO2: 23 mmol/L (ref 22–32)
CREATININE: 0.59 mg/dL — AB (ref 0.61–1.24)
Calcium: 8.5 mg/dL — ABNORMAL LOW (ref 8.9–10.3)
GFR calc non Af Amer: >60 mL/min (ref >60)
GLUCOSE: 109 mg/dL — AB (ref 65–99)
Potassium: 4 mmol/L (ref 3.5–5.1)
Sodium: 139 mmol/L (ref 135–145)
Total Bilirubin: 1.6 mg/dL — ABNORMAL HIGH (ref 0.3–1.2)
Total Protein: 6.1 g/dL — ABNORMAL LOW (ref 6.5–8.1)

## 2018-01-07 LAB — HEMOGLOBIN AND HEMATOCRIT, BLOOD
HCT: 28.5 % — ABNORMAL LOW (ref 39.0–52.0)
HEMATOCRIT: 34.9 % — AB (ref 39.0–52.0)
HEMATOCRIT: 32.2 % — AB (ref 39.0–52.0)
HEMOGLOBIN: 11.1 g/dL — AB (ref 13.0–17.0)
HEMOGLOBIN: 9.2 g/dL — AB (ref 13.0–17.0)
Hemoglobin: 10.4 g/dL — ABNORMAL LOW (ref 13.0–17.0)

## 2018-01-07 MED ORDER — PHENOL 1.4 % MT LIQD
1.0000 | OROMUCOSAL | Status: DC | PRN
  Administered 2018-01-08: 1 via OROMUCOSAL
  Filled 2018-01-07: qty 177

## 2018-01-07 MED ORDER — FENTANYL CITRATE (PF) 100 MCG/2ML IJ SOLN
25.0000 ug | INTRAMUSCULAR | Status: DC | PRN
  Administered 2018-01-07 – 2018-01-09 (×12): 25 ug via INTRAVENOUS
  Filled 2018-01-07 (×13): qty 2

## 2018-01-07 MED ORDER — DIPHENHYDRAMINE HCL 12.5 MG/5ML PO ELIX
25.0000 mg | ORAL_SOLUTION | Freq: Every day | ORAL | Status: DC | PRN
  Administered 2018-01-07 – 2018-01-10 (×3): 25 mg via ORAL
  Filled 2018-01-07 (×3): qty 10
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
## 2018-01-07 NOTE — Progress Notes (Signed)
Per Dr. Juanetta GoslingHawkins recommendation, will attempt weaning today, propofol to be titrated down to assist wake up assessment and weaning process.
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
## 2018-01-07 NOTE — Progress Notes (Signed)
PROGRESS NOTE  Joseph Cortez EEF:[id number] DOB: 1966-03-26 DOA: 01/06/2018 PCP: Gardiner Rhyme, MD  Brief History:  52 y.o. male with medical history of alcoholic cirrhosis, tobacco abuse, COPD, esophageal varices presenting with hematemesis that began around 2 AM on 01/06/2018.  The patient had been in his usual state of health without any major complaints until 2 AM on 01/06/2018 when he woke up with some nausea and emesis.  The patient denied any fevers, chills, chest pain, shortness breath, but he had complained of some dizziness without any syncope.  He had denied NSAID use and any other new medications, but states that he recently started drinking alcohol again.  The patient was seen by GI on the day of admission.  He underwent EGD which revealed 4 columns of esophageal varices which were banded.  GI wanted the patient to remain intubated overnight 01/06/2018.  The patient struggled with agitation requiring increasing amounts of sedation.  Pulmonary medicine was consulted to assist in weaning the patient off the ventilator as the patient had a previous history of prolonged intubation and tracheostomy.   Assessment/Plan: Acute blood loss anemia/symptomatic anemia -Secondary to upper GI bleed -His baseline hemoglobin ~11 on Care Everywhere -drop in Hgb partly dilutional -am CBC  Upper GI bleed/variceal bleed -GI consulted-->s/p EGD 01/06/18 -Continue ceftriaxone -Continue octreotide -Continue Protonix drip -03/15/2017 EGD--4 columns of grade 2 esophageal varices, banded -05/24/2017 colonoscopy--internal hemorrhoids, diverticulosis  Alcoholic liver cirrhosis with ascites -Patient had been sober since January 2019, but has started drinking again -Continue thiamine -12/10/2017 alpha-fetoprotein--2.9 -restart furosemide and aldactone -CT abdomen pelvis with and without contrast December 29, 2017 at outside facility showed hepatic cirrhosis without HCC, signs of portal venous  hypertension with portal colopathy and potential portal gastropathy, small volume ascites. Large right inguinal hernia with bowel contents extending into the right hemiscrotum. -last paracentesis on 11/05/17 in [place]  Acute respiratory failure with hypoxia -Patient left on mechanical ventilation overnight 01/06/18 through 01/07/18 -Appreciate pulmonary consult -Wean to extubation -Personally reviewed chest x-ray, haziness left hemithorax, right basilar atelectasis.  COPD -stable on RA  Thrombocytopenia -chronic and stable -due to liver cirrhosis  LUQ/Epigastric abdominal pain -check lipase--25 -CT abd if no improvement  Alcohol abuse -drank last few days prior to admission -CIWA protocol   Disposition Plan:   Home in 2-3 days if stable Family Communication:   Sister updated at bedside--I have discussed tobacco cessation with the patient.  I have counseled the patient regarding the negative impacts of continued tobacco use including but not limited to lung cancer, COPD, and cardiovascular disease.  I have discussed alternatives to tobacco and modalities that may help facilitate tobacco cessation including but not limited to biofeedback, hypnosis, and medications.  Total time spent with tobacco counseling was 4 minutes.   Consultants:  GI, pulmonary  Code Status:  FULL  DVT Prophylaxis:  SCDs   Procedures: As Listed in Progress Note Above  Antibiotics: Ceftriaxone 4/11>>>  The patient is critically ill with multiple organ systems failure and requires high complexity decision making for assessment and support, frequent evaluation and titration of therapies, application of advanced monitoring technologies and extensive interpretation of multiple databases.  Critical care time - 35 mins.      Subjective: Patient is awake on a bilateral.  He denies any chest pain, shortness breath, vomiting, diarrhea.  Complains of some mild abdominal pain which is unchanged from  yesterday.  There is no respiratory distress.  Objective:  Vitals:   01/07/18 0700 01/07/18 0757 01/07/18 0800 01/07/18 0840  BP:   (!) 113/93   Pulse: 86  93   Resp: 16  17   Temp:  98.1 F (36.7 C)    TempSrc:  Axillary    SpO2: 100%  100%   Weight:      Height:    _0  (1.676 m)    Intake/Output Summary (Last 24 hours) at 01/07/2018 0847 Last data filed at 01/07/2018 0700 Gross per 24 hour  Intake 2976.62 ml  Output 800 ml  Net 2176.62 ml   Weight change:  Exam:   General:  Pt is alert, follows commands appropriately, not in acute distress  HEENT: No icterus, No thrush, No neck mass, [place]/AT  Cardiovascular: RRR, S1/S2, no rubs, no gallops  Respiratory: Bibasilar crackles.  No wheezing.  Abdomen: Soft/+BS, mild diffuse tender, non distended, no guarding  Extremities: No edema, No lymphangitis, No petechiae, No rashes, no synovitis   Data Reviewed: I have personally reviewed following labs and imaging studies Basic Metabolic Panel: Recent Labs  Lab 01/06/18 1137 01/07/18 0400  NA 138 139  K 3.6 4.0  CL 101 106  CO2 22 23  GLUCOSE 111* 109*  BUN 23* 18  CREATININE 0.53* 0.59*  CALCIUM 9.1 8.5*   Liver Function Tests: Recent Labs  Lab 01/06/18 1137 01/07/18 0400  AST 44* 36  ALT 23 17  ALKPHOS 125 99  BILITOT 1.6* 1.6*  PROT 7.4 6.1*  ALBUMIN 3.7 3.1*   Recent Labs  Lab 01/06/18 2015  LIPASE 25   No results for input(s): AMMONIA in the last 168 hours. Coagulation Profile: Recent Labs  Lab 01/06/18 1144  INR 1.39   CBC: Recent Labs  Lab 01/06/18 1137 01/06/18 2315 01/07/18 0400  WBC 8.1  --  4.4  NEUTROABS 5.7  --   --   HGB 11.8* 9.8* 10.3*  HCT 36.7* 29.6* 32.0*  MCV 90.2  --  92.0  PLT 158  --  89*   Cardiac Enzymes: No results for input(s): CKTOTAL, CKMB, CKMBINDEX, TROPONINI in the last 168 hours. BNP: Invalid input(s): POCBNP CBG: No results for input(s): GLUCAP in the last 168 hours. HbA1C: No results for input(s):  HGBA1C in the last 72 hours. Urine analysis:    Component Value Date/Time   COLORURINE AMBER (A) 09/13/2017 1915   APPEARANCEUR CLEAR 09/13/2017 1915   LABSPEC 1.017 09/13/2017 1915   PHURINE 6.0 09/13/2017 1915   GLUCOSEU NEGATIVE 09/13/2017 1915   HGBUR NEGATIVE 09/13/2017 1915   BILIRUBINUR NEGATIVE 09/13/2017 [place] NEGATIVE 09/13/2017 [place] NEGATIVE 09/13/2017 1915   NITRITE NEGATIVE 09/13/2017 1915   LEUKOCYTESUR NEGATIVE 09/13/2017 1915   Sepsis Labs: _1 (procalcitonin:4,lacticidven:4) ) Recent Results (from the past 240 hour(s))  MRSA PCR Screening     Status: None   Collection Time: 01/06/18  5:36 PM  Result Value Ref Range Status   MRSA by PCR NEGATIVE NEGATIVE Final    Comment:        The GeneXpert MRSA Assay (FDA approved for NASAL specimens only), is one component of a comprehensive MRSA colonization surveillance program. It is not intended to diagnose MRSA infection nor to guide or monitor treatment for MRSA infections. Performed at [facility], [address], [place], [place] [address]      Scheduled Meds: . chlorhexidine gluconate (MEDLINE KIT)  15 mL Mouth Rinse BID  . folic acid  1 mg Oral Daily  . furosemide  80 mg Oral Daily  . gabapentin  300-600 mg Oral See admin instructions  . lactulose  30 g Oral TID  . mouth rinse  15 mL Mouth Rinse 10 times per day  . multivitamin with minerals  1 tablet Oral Daily  . multivitamin with minerals  1 tablet Oral Daily  . octreotide  50 mcg Intravenous Once  . [START ON 01/10/2018] pantoprazole  40 mg Intravenous Q12H  . thiamine  100 mg Oral Daily   Or  . thiamine  100 mg Intravenous Daily   Continuous Infusions: . 0.9 % NaCl with KCl 20 mEq / L 50 mL/hr at 01/06/18 2127  . cefTRIAXone (ROCEPHIN)  IV Stopped (01/06/18 2158)  . cefTRIAXone (ROCEPHIN)  IV    . midazolam (VERSED) infusion Stopped (01/07/18 0345)  . octreotide  (SANDOSTATIN)    IV infusion 50 mcg/hr (01/07/18  0017)  . pantoprozole (PROTONIX) infusion 8 mg/hr (01/07/18 0017)  . propofol (DIPRIVAN) infusion Stopped (01/07/18 0825)    Procedures/Studies: Dg Chest Port 1 View  Result Date: 01/06/2018 CLINICAL DATA:  Status post intubation falling emergent EGD for esophageal varices. EXAM: PORTABLE CHEST 1 VIEW COMPARISON:  08/01/2016 FINDINGS: Endotracheal tube tip projects 3.6 cm above the carinal. There is hazy opacity throughout the left hemithorax, likely due to layering pleural fluid. Mild atelectasis is noted at the medial right lung base. Remainder the right lung is clear. Cardiac silhouette is normal in size. No pneumothorax. IMPRESSION: 1. Well-positioned endotracheal tube. 2. Hazy opacity throughout the left hemithorax. This is most likely due to layering pleural fluid. Mild right lung base atelectasis. Electronically Signed   By: Lajean Manes M.D.   On: 01/06/2018 15:52    Orson Eva, DO  [facility] Pager [phone]  If 7PM-7AM, please contact night-coverage [email or web address] Password [facility] 01/07/2018, 8:47 AM   LOS: 1 day

## 2018-01-07 NOTE — Addendum Note (Signed)
Addendum  created 01/07/18 1105 by Franco NonesYates, Evvie Behrmann S, CRNA   Charge Capture section accepted

## 2018-01-07 NOTE — Addendum Note (Signed)
Addendum  created 01/07/18 1102 by Franco NonesYates, Gryphon Vanderveen S, CRNA   Charge Capture section accepted

## 2018-01-07 NOTE — Addendum Note (Signed)
Addendum  created 01/07/18 1537 by Moshe Salisburyaniel, Dianna Deshler E, CRNA   Sign clinical note
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
## 2018-01-07 NOTE — Progress Notes (Signed)
RT weaned patient on CPAP/PS 5/5 40%. Patient tolerated well. Weaning parameters performed, patient performed -25 NIF and FVC 1.4L. ABG was also done and all results called to physician. RT received orders to extubate. RT extubated to 2L O2, patient maintaing SATs 99%, HR 95, RR 22 and BP 124/91. Patient remained stable throughout. RT will continue to monitor patient.

## 2018-01-07 NOTE — Anesthesia Postprocedure Evaluation (Signed)
Anesthesia Post Note  Patient: [name]  Procedure(s) Performed: ESOPHAGOGASTRODUODENOSCOPY (EGD) (N/A )  Patient location during evaluation: ICU Anesthesia Type: General Level of consciousness: awake and alert and oriented Pain management: pain level controlled Vital Signs Assessment: post-procedure vital signs reviewed and stable Respiratory status: spontaneous breathing Cardiovascular status: blood pressure returned to baseline and stable Postop Assessment: no apparent nausea or vomiting Anesthetic complications: no     Last Vitals:  Vitals:   01/07/18 1100 01/07/18 1254  BP:    Pulse: (!) 116   Resp: (!) 21   Temp:  36.6 C  SpO2: 98%     Last Pain:  Vitals:   01/07/18 1401  TempSrc:   PainSc: 7                  Ellese Julius

## 2018-01-08 ENCOUNTER — Inpatient Hospital Stay (HOSPITAL_COMMUNITY): Payer: Medicaid - Out of State

## 2018-01-08 ENCOUNTER — Encounter (HOSPITAL_COMMUNITY): Payer: Self-pay

## 2018-01-08 LAB — CBC
HCT: 30.2 % — ABNORMAL LOW (ref 39.0–52.0)
HEMOGLOBIN: 9.7 g/dL — AB (ref 13.0–17.0)
MCH: 29.7 pg (ref 26.0–34.0)
MCHC: 32.1 g/dL (ref 30.0–36.0)
MCV: 92.4 fL (ref 78.0–100.0)
Platelets: 101 10*3/uL — ABNORMAL LOW (ref 150–400)
RBC: 3.27 MIL/uL — ABNORMAL LOW (ref 4.22–5.81)
RDW: 15.8 % — ABNORMAL HIGH (ref 11.5–15.5)
WBC: 3.8 10*3/uL — ABNORMAL LOW (ref 4.0–10.5)

## 2018-01-08 LAB — BASIC METABOLIC PANEL
ANION GAP: 9 (ref 5–15)
BUN: 12 mg/dL (ref 6–20)
CALCIUM: 8.5 mg/dL — AB (ref 8.9–10.3)
CO2: 22 mmol/L (ref 22–32)
Chloride: 105 mmol/L (ref 101–111)
Creatinine, Ser: 0.63 mg/dL (ref 0.61–1.24)
GFR calc Af Amer: >60 mL/min (ref >60)
GFR calc non Af Amer: >60 mL/min (ref >60)
Glucose, Bld: 122 mg/dL — ABNORMAL HIGH (ref 65–99)
Potassium: 3.6 mmol/L (ref 3.5–5.1)
Sodium: 136 mmol/L (ref 135–145)

## 2018-01-08 LAB — HIV ANTIBODY (ROUTINE TESTING W REFLEX): HIV Screen 4th Generation wRfx: NONREACTIVE

## 2018-01-08 LAB — HEMOGLOBIN AND HEMATOCRIT, BLOOD
HCT: 29.2 % — ABNORMAL LOW (ref 39.0–52.0)
HEMOGLOBIN: 9.4 g/dL — AB (ref 13.0–17.0)

## 2018-01-08 MED ORDER — IOPAMIDOL (ISOVUE-300) INJECTION 61%
100.0000 mL | Freq: Once | INTRAVENOUS | Status: AC | PRN
  Administered 2018-01-08: 100 mL via INTRAVENOUS

## 2018-01-08 MED ORDER — IOPAMIDOL (ISOVUE-300) INJECTION 61%
30.0000 mL | Freq: Once | INTRAVENOUS | Status: AC | PRN
  Administered 2018-01-08: 30 mL via ORAL

## 2018-01-08 NOTE — Progress Notes (Signed)
Patient alert and conversant appears in no acute distress. No further bleeding. Hemoglobin this morning 9.4  Vital signs in last 24 hours: Temp:  [98 F (36.7 C)-98.9 F (37.2 C)] 98.5 F (36.9 C) (04/13 1136) Pulse Rate:  [78-89] 83 (04/13 1136) Resp:  [15-18] 15 (04/13 1136) BP: (107-122)/(81-96) 109/81 (04/13 0600) SpO2:  [93 %-99 %] 98 % (04/13 1136) Weight:  [183 lb 3.2 oz (83.1 kg)] 183 lb 3.2 oz (83.1 kg) (04/13 0445) Last BM Date: 01/07/18 General:   Alert,   pleasant and cooperative in NAD Abdomen:  Soft, nontender and nondistended.  Normal bowel sounds, without guarding, and without rebound.  No mass or organomegaly. Extremities:  Without clubbing or edema.    Intake/Output from previous day: 04/12 0701 - 04/13 0700 In: 1321.1 [I.V.:1221.1; IV Piggyback:100] Out: 600 [Urine:600] Intake/Output this shift: Total I/O In: 50 [I.V.:50] Out: 275 [Urine:275]  Lab Results: Recent Labs    01/06/18 1137  01/07/18 0400  01/07/18 2011 01/08/18 0220 01/08/18 0808  WBC 8.1  --  4.4  --   --  3.8*  --   HGB 11.8*   < > 10.3*   < > 9.2* 9.7* 9.4*  HCT 36.7*   < > 32.0*   < > 28.5* 30.2* 29.2*  PLT 158  --  89*  --   --  101*  --    < > = values in this interval not displayed.   BMET Recent Labs    01/06/18 1137 01/07/18 0400 01/08/18 0220  NA 138 139 136  K 3.6 4.0 3.6  CL 101 106 105  CO2 22 23 22   GLUCOSE 111* 109* 122*  BUN 23* 18 12  CREATININE 0.53* 0.59* 0.63  CALCIUM 9.1 8.5* 8.5*   LFT Recent Labs    01/07/18 0400  PROT 6.1*  ALBUMIN 3.1*  AST 36  ALT 17  ALKPHOS 99  BILITOT 1.6*   PT/INR Recent Labs    01/06/18 1144  LABPROT 17.0*  INR 1.39   Hepatitis Panel No results for input(s): HEPBSAG, HCVAB, HEPAIGM, HEPBIGM in the last 72 hours. C-Diff No results for input(s): CDIFFTOX in the last 72 hours.  Studies/Results: Dg Chest Port 1 View  Result Date: 01/06/2018 CLINICAL DATA:  Status post intubation falling emergent EGD for  esophageal varices. EXAM: PORTABLE CHEST 1 VIEW COMPARISON:  08/01/2016 FINDINGS: Endotracheal tube tip projects 3.6 cm above the carinal. There is hazy opacity throughout the left hemithorax, likely due to layering pleural fluid. Mild atelectasis is noted at the medial right lung base. Remainder the right lung is clear. Cardiac silhouette is normal in size. No pneumothorax. IMPRESSION: 1. Well-positioned endotracheal tube. 2. Hazy opacity throughout the left hemithorax. This is most likely due to layering pleural fluid. Mild right lung base atelectasis. Electronically Signed   By: Amie Portlandavid  Ormond M.D.   On: 01/06/2018 15:52    Impression:  52 year old with EtOH cirrhosis/portal hypertension/esophageal hemorrhage  -  Varices;   status post EBL.  Stomach incompletely seen at recent EGD. History of intolerance to nonselective beta blockers.   Recommendations:  Advance diet. Antibiotics for 5 more days.  Sandostatin can be discontinued tomorrow  Office visit in 3-4 weeks with plans to repeat EGD in about 4 weeks.  Continue once daily PPI.

## 2018-01-08 NOTE — Progress Notes (Signed)
PROGRESS NOTE  Joseph EstimableMichael Cortez ZOX:[id number] DOB: 1966/04/02 DOA: 01/06/2018 PCP: Clinton SawyerShah, Minesh R, MD  Brief History:  52 y.o.malewith medical history ofalcoholic cirrhosis, tobacco abuse, COPD, esophageal varices presenting with hematemesis that began around 2 AM on 01/06/2018. The patient had been in his usual state of health without any major complaints until 2 AM on 01/06/2018 when he woke up with some nausea and emesis. The patient denied any fevers, chills, chest pain, shortness breath, but he had complained of some dizziness without any syncope.  He had denied NSAID use and any other new medications, but states that he recently started drinking alcohol again.  The patient was seen by GI on the day of admission.  He underwent EGD which revealed 4 columns of esophageal varices which were banded.  GI wanted the patient to remain intubated overnight 01/06/2018.  The patient struggled with agitation requiring increasing amounts of sedation.  Pulmonary medicine was consulted to assist in weaning the patient off the ventilator as the patient had a previous history of prolonged intubation and tracheostomy.  The patient was subsequently extubated successfully on 01/07/2018.  He was transitioned to room air and remained stable.   Assessment/Plan: Acute blood loss anemia/symptomatic anemia -Secondary to upper GI bleed/variceal bleed -His baseline hemoglobin~11 on Care Everywhere -drop in Hgb partly dilutional -am CBC  Upper GI bleed/variceal bleed -GI consulted-->s/p EGD 01/06/18 -Continue ceftriaxone x 5 days -Continue octreotide drip through today -Continue Protonix drip>>>transition to intermittent dosing protonix 4/14 -01/06/2018 EGD--4 columns of grade 2/3 esophageal varices, banded -05/24/2017 colonoscopy--internal hemorrhoids, diverticulosis -Advance diet per GI  Alcoholic liver cirrhosis with ascites -Patient hadbeen sober since January 2019, but has started drinking  again -Continue thiamine -12/10/2017 alpha-fetoprotein--2.9 -restart furosemide and aldactone -CT abdomen pelvis with and without contrast December 29, 2017 at outside facility showed hepatic cirrhosis without HCC, signs of portal venous hypertension with portal colopathy and potential portal gastropathy, small volume ascites. Large right inguinal hernia with bowel contents extending into the right hemiscrotum. -last paracentesis on 11/05/17 in [place] -pt refuses lactulose  Acute respiratory failure with hypoxia -Patient left on mechanical ventilation overnight 01/06/18 through 01/07/18 -Appreciate pulmonary consult -Extubated 01/07/2018 --Now stable on room air  COPD -stable on RA  Thrombocytopenia -chronic and stable -due to liver cirrhosis  LUQ/Epigastric abdominal pain -check lipase--25 -CT abdomen and pelvis -Pain is out of proportion with exam findings -Patient has some drug-seeking behavior  Alcohol abuse -drank last few days prior to admission -CIWA protocol -Continue thiamine   Disposition Plan:   Home 4/15 if stable Family Communication:   No family present   Consultants:  GI, pulmonary  Code Status:  FULL  DVT Prophylaxis:  SCDs   Procedures: As Listed in Progress Note Above  Antibiotics: Ceftriaxone 4/11>>>     Subjective: Patient complains of pain all over.  He states that his abdominal pain is about the same.  He states his abdominal pain was worse after his EGD. Objective: Vitals:   01/08/18 0600 01/08/18 0741 01/08/18 1136 01/08/18 1400  BP: 109/81   (!) 126/92  Pulse: 84 78 83 86  Resp: 16 17 15 17   Temp:  98.2 F (36.8 C) 98.5 F (36.9 C)   TempSrc:  Oral Oral   SpO2: 95% 98% 98% 99%  Weight:      Height:        Intake/Output Summary (Last 24 hours) at 01/08/2018 1451 Last data filed at 01/08/2018 1318 Gross  per 24 hour  Intake 1000 ml  Output 275 ml  Net 725 ml   Weight change: -0.3 kg (-10.6 oz) Exam:   General:   Pt is alert, follows commands appropriately, not in acute distress  HEENT: No icterus, No thrush, No neck mass, [place]/AT  Cardiovascular: RRR, S1/S2, no rubs, no gallops  Respiratory: Bibasilar crackles.  No wheeze.  Abdomen: Soft/+BS, diffuse tender, non distended, no guarding  Extremities: trace LE edema, No lymphangitis, No petechiae, No rashes, no synovitis   Data Reviewed: I have personally reviewed following labs and imaging studies Basic Metabolic Panel: Recent Labs  Lab 01/06/18 1137 01/07/18 0400 01/08/18 0220  NA 138 139 136  K 3.6 4.0 3.6  CL 101 106 105  CO2 22 23 22   GLUCOSE 111* 109* 122*  BUN 23* 18 12  CREATININE 0.53* 0.59* 0.63  CALCIUM 9.1 8.5* 8.5*   Liver Function Tests: Recent Labs  Lab 01/06/18 1137 01/07/18 0400  AST 44* 36  ALT 23 17  ALKPHOS 125 99  BILITOT 1.6* 1.6*  PROT 7.4 6.1*  ALBUMIN 3.7 3.1*   Recent Labs  Lab 01/06/18 2015  LIPASE 25   No results for input(s): AMMONIA in the last 168 hours. Coagulation Profile: Recent Labs  Lab 01/06/18 1144  INR 1.39   CBC: Recent Labs  Lab 01/06/18 1137  01/07/18 0400 01/07/18 0906 01/07/18 1412 01/07/18 2011 01/08/18 0220 01/08/18 0808  WBC 8.1  --  4.4  --   --   --  3.8*  --   NEUTROABS 5.7  --   --   --   --   --   --   --   HGB 11.8*   < > 10.3* 11.1* 10.4* 9.2* 9.7* 9.4*  HCT 36.7*   < > 32.0* 34.9* 32.2* 28.5* 30.2* 29.2*  MCV 90.2  --  92.0  --   --   --  92.4  --   PLT 158  --  89*  --   --   --  101*  --    < > = values in this interval not displayed.   Cardiac Enzymes: No results for input(s): CKTOTAL, CKMB, CKMBINDEX, TROPONINI in the last 168 hours. BNP: Invalid input(s): POCBNP CBG: No results for input(s): GLUCAP in the last 168 hours. HbA1C: No results for input(s): HGBA1C in the last 72 hours. Urine analysis:    Component Value Date/Time   COLORURINE AMBER (A) 09/13/2017 1915   APPEARANCEUR CLEAR 09/13/2017 1915   LABSPEC 1.017 09/13/2017 1915    PHURINE 6.0 09/13/2017 1915   GLUCOSEU NEGATIVE 09/13/2017 1915   HGBUR NEGATIVE 09/13/2017 1915   BILIRUBINUR NEGATIVE 09/13/2017 1915   KETONESUR NEGATIVE 09/13/2017 1915   PROTEINUR NEGATIVE 09/13/2017 1915   NITRITE NEGATIVE 09/13/2017 1915   LEUKOCYTESUR NEGATIVE 09/13/2017 1915   Sepsis Labs: @LABRCNTIP (procalcitonin:4,lacticidven:4) ) Recent Results (from the past 240 hour(s))  MRSA PCR Screening     Status: None   Collection Time: 01/06/18  5:36 PM  Result Value Ref Range Status   MRSA by PCR NEGATIVE NEGATIVE Final    Comment:        The GeneXpert MRSA Assay (FDA approved for NASAL specimens only), is one component of a comprehensive MRSA colonization surveillance program. It is not intended to diagnose MRSA infection nor to guide or monitor treatment for MRSA infections. Performed at [facility], [address] [place], [place] [address]      Scheduled Meds: . folic acid  1 mg Oral Daily  . furosemide  80 mg Oral Daily  . gabapentin  300-600 mg Oral See admin instructions  . lactulose  30 g Oral TID  . multivitamin with minerals  1 tablet Oral Daily  . multivitamin with minerals  1 tablet Oral Daily  . octreotide  50 mcg Intravenous Once  . [START ON 01/10/2018] pantoprazole  40 mg Intravenous Q12H  . thiamine  100 mg Oral Daily   Or  . thiamine  100 mg Intravenous Daily   Continuous Infusions: . cefTRIAXone (ROCEPHIN)  IV Stopped (01/08/18 0134)  . octreotide  (SANDOSTATIN)    IV infusion 50 mcg/hr (01/08/18 1132)  . pantoprozole (PROTONIX) infusion 8 mg/hr (01/08/18 0732)    Procedures/Studies: Dg Chest Port 1 View  Result Date: 01/06/2018 CLINICAL DATA:  Status post intubation falling emergent EGD for esophageal varices. EXAM: PORTABLE CHEST 1 VIEW COMPARISON:  08/01/2016 FINDINGS: Endotracheal tube tip projects 3.6 cm above the carinal. There is hazy opacity throughout the left hemithorax, likely due to layering pleural fluid. Mild atelectasis is  noted at the medial right lung base. Remainder the right lung is clear. Cardiac silhouette is normal in size. No pneumothorax. IMPRESSION: 1. Well-positioned endotracheal tube. 2. Hazy opacity throughout the left hemithorax. This is most likely due to layering pleural fluid. Mild right lung base atelectasis. Electronically Signed   By: Amie Portland M.D.   On: 01/06/2018 15:52    Catarina Hartshorn, DO  [facility] Pager [phone]  If 7PM-7AM, please contact night-coverage [email or web address] Password [email or web address] 01/08/2018, 2:51 PM   LOS: 2 days

## 2018-01-08 NOTE — Progress Notes (Signed)
He came off the ventilator with no difficulty yesterday and is comfortable and not requiring oxygen.  I will sign off.  Thanks for allowing me to participate in his care

## 2018-01-09 ENCOUNTER — Inpatient Hospital Stay (HOSPITAL_COMMUNITY): Payer: Medicaid - Out of State

## 2018-01-09 DIAGNOSIS — I361 Nonrheumatic tricuspid (valve) insufficiency: Secondary | ICD-10-CM

## 2018-01-09 DIAGNOSIS — D735 Infarction of spleen: Secondary | ICD-10-CM

## 2018-01-09 DIAGNOSIS — I8511 Secondary esophageal varices with bleeding: Secondary | ICD-10-CM

## 2018-01-09 DIAGNOSIS — D62 Acute posthemorrhagic anemia: Principal | ICD-10-CM

## 2018-01-09 LAB — CBC
HCT: 29.5 % — ABNORMAL LOW (ref 39.0–52.0)
HEMOGLOBIN: 9.7 g/dL — AB (ref 13.0–17.0)
MCH: 30 pg (ref 26.0–34.0)
MCHC: 32.9 g/dL (ref 30.0–36.0)
MCV: 91.3 fL (ref 78.0–100.0)
Platelets: 109 10*3/uL — ABNORMAL LOW (ref 150–400)
RBC: 3.23 MIL/uL — AB (ref 4.22–5.81)
RDW: 15.5 % (ref 11.5–15.5)
WBC: 5.1 10*3/uL (ref 4.0–10.5)

## 2018-01-09 LAB — BASIC METABOLIC PANEL
Anion gap: 11 (ref 5–15)
BUN: 8 mg/dL (ref 6–20)
CHLORIDE: 103 mmol/L (ref 101–111)
CO2: 21 mmol/L — ABNORMAL LOW (ref 22–32)
CREATININE: 0.65 mg/dL (ref 0.61–1.24)
Calcium: 8.3 mg/dL — ABNORMAL LOW (ref 8.9–10.3)
GFR calc Af Amer: >60 mL/min (ref >60)
GFR calc non Af Amer: >60 mL/min (ref >60)
Glucose, Bld: 103 mg/dL — ABNORMAL HIGH (ref 65–99)
POTASSIUM: 3.4 mmol/L — AB (ref 3.5–5.1)
SODIUM: 135 mmol/L (ref 135–145)

## 2018-01-09 LAB — TRIGLYCERIDES: Triglycerides: 74 mg/dL (ref <150)

## 2018-01-09 LAB — ECHOCARDIOGRAM COMPLETE
Height: 66 in
Weight: 2931.24 oz

## 2018-01-09 MED ORDER — GABAPENTIN 300 MG PO CAPS
600.0000 mg | ORAL_CAPSULE | Freq: Every day | ORAL | Status: DC
Start: 2018-01-09 — End: 2018-01-10
  Administered 2018-01-09: 600 mg via ORAL
  Filled 2018-01-09: qty 2

## 2018-01-09 MED ORDER — PANTOPRAZOLE SODIUM 40 MG PO TBEC
40.0000 mg | DELAYED_RELEASE_TABLET | Freq: Every day | ORAL | Status: DC
  Administered 2018-01-09 – 2018-01-10 (×2): 40 mg via ORAL
  Filled 2018-01-09 (×2): qty 1

## 2018-01-09 MED ORDER — GABAPENTIN 300 MG PO CAPS
300.0000 mg | ORAL_CAPSULE | ORAL | Status: DC
  Administered 2018-01-09 – 2018-01-10 (×2): 300 mg via ORAL
  Filled 2018-01-09 (×2): qty 1

## 2018-01-09 MED ORDER — SPIRONOLACTONE 25 MG PO TABS
100.0000 mg | ORAL_TABLET | Freq: Every day | ORAL | Status: DC
  Administered 2018-01-09 – 2018-01-10 (×2): 100 mg via ORAL
  Filled 2018-01-09: qty 4
  Filled 2018-01-09: qty 1

## 2018-01-09 MED ORDER — OCTREOTIDE ACETATE 500 MCG/ML IJ SOLN
INTRAMUSCULAR | Status: AC
  Filled 2018-01-09: qty 1

## 2018-01-09 MED ORDER — OXYCODONE HCL 5 MG PO TABS
5.0000 mg | ORAL_TABLET | Freq: Four times a day (QID) | ORAL | Status: DC | PRN
  Administered 2018-01-09 – 2018-01-10 (×5): 5 mg via ORAL
  Filled 2018-01-09 (×5): qty 1

## 2018-01-09 NOTE — Discharge Summary (Signed)
Physician Discharge Summary  Joseph Cortez WUJ:[id number] DOB: [birth year] DOA: 01/06/2018  PCP: Clinton Sawyer, MD  Admit date: 01/06/2018 Discharge date: 01/10/2018  Admitted From: Home Disposition:  Home  Recommendations for Outpatient Follow-up:  1. Follow up with PCP in 1-2 weeks 2. Please obtain BMP/CBC in one week  Discharge Condition: Stable CODE STATUS: FULL Diet recommendation: Low sodium   Brief/Interim Summary: 52 y.o.malewith medical history ofalcoholic cirrhosis, tobacco abuse, COPD, esophageal varices presenting with hematemesis that began around 2 AM on 01/06/2018. The patient had been in his usual state of health without any major complaints until 2 AM on 01/06/2018 when he woke up with some nausea and emesis. The patient denied any fevers, chills, chest pain, shortness breath, but he had complained of some dizziness without any syncope.He had denied NSAID use and any other new medications, but states that he recently started drinking alcohol again. The patient was seen by GI on the day of admission.  The patient was started on IV Protonix drip and octreotide drip. He underwent EGD which revealed 4columns of esophageal varices which were banded. GI wanted the patient to remain intubated overnight 01/06/2018. The patient struggled with agitation requiring increasing amounts of sedation. Pulmonary medicine was consulted to assist in weaning the patient off the ventilator as the patient had a previous history of prolonged intubation and tracheostomy.The patient was subsequently extubated successfully on 01/07/2018. He was transitioned to room air and remained stable.    Discharge Diagnoses:  Acute blood loss anemia/symptomatic anemia -Secondary to upper GI bleed/variceal bleed -His baseline hemoglobin~11 on Care Everywhere -Hgb overall stable in last 96 hours -Hgb 9.7 on day of d/c  Upper GI bleed/variceal bleed -GI consulted-->s/p EGD 01/06/18 -Continue  ceftriaxone while in hospital, transition to po cipro after d/c x 3 more days to complete 1 week of tx -Discontinued octreotidedrip 4/14--had 3 days during hospitalization -Continue Protonix drip>>>transition to intermittent dosing protonix 4/14 -4/11/2019EGD--4 columns of grade 2/3esophageal varices, banded -05/24/2017 colonoscopy--internal hemorrhoids, diverticulosis -Advance diet per GI--pt tolerating  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 and resultant complications/splenomegaly -Echo to r/o embolic source--EF 60-65%, grade 1 DD, sm-moderate pericardial effusion; no embolic source -contributes to his abd pain, but pain is out of proportion with exam -partly contributing to left pleural effusion -check lupus anticoagulant, factor V Leiden and prothrombin gene mutation--pending at time of d/c -12/29/17 CT abd [place]--no splenic infarction -01/08/2018 CT abdomen--no liver mass; cirrhosis with portal venous hypertension; patent portal vein; new splenic infarct; moderate ascites; moderate left pleural effusion  Alcoholic liver cirrhosis with ascites -Patient hadbeen sober since January 2019, but has started drinking again -Continue thiamine -12/10/2017 alpha-fetoprotein--2.9 -restart furosemide and aldactone -last paracentesis on 11/05/17 in [place] -pt refused lactulose early in the admission, now taking it -requested repeat paracentesis--spoke with radiology-->not enough fluid to perform safely -mental status is clear  LUQ/Epigastric abdominal pain -check lipase--25 -CTabdomen and pelvis as dicussed above -Pain is out of proportion with exam findings -Patient has some opioid seeking behavior -changed to po opioid  Acute respiratory failure with hypoxia -Patient left on mechanical ventilation overnight 01/06/18 through 01/07/18 -Appreciate pulmonary consult -Extubated 01/07/2018 -Now stable on room air--sat 97-98%  COPD -stable on  RA  Thrombocytopenia -chronic and stable -due to liver cirrhosis  Alcohol abuse -last drink was 2 days prior to admission--drinking wine -CIWA protocol -Continue thiamine  Hypokalemia -repleted -expect improvement with restart aldactone      Discharge Instructions   Allergies as of 01/10/2018  Reactions   Sertraline Other (See Comments)   Suicidal ideations      Medication List    STOP taking these medications   LORazepam 1 MG tablet Commonly known as:  ATIVAN     TAKE these medications   acetaminophen 650 MG CR tablet Commonly known as:  TYLENOL Take 650 mg by mouth every 8 (eight) hours as needed for pain.   amitriptyline 25 MG tablet Commonly known as:  ELAVIL Take 1 tablet as needed by mouth.   B COMPLETE PO Take 1 tablet by mouth daily.   bismuth subsalicylate 262 MG/15ML suspension Commonly known as:  PEPTO BISMOL Take 30 mLs by mouth every 6 (six) hours as needed (for diarrhea/loose stools or upset stomach.).   ciprofloxacin 500 MG tablet Commonly known as:  CIPRO Take 1 tablet (500 mg total) by mouth 2 (two) times daily.   citalopram 10 MG tablet Commonly known as:  CELEXA Take 10 mg by mouth daily.   disulfiram 250 MG tablet Commonly known as:  ANTABUSE Take 250 mg by mouth daily.   furosemide 80 MG tablet Commonly known as:  LASIX Take 1 tablet by mouth daily.   gabapentin 300 MG capsule Commonly known as:  NEURONTIN Take 300-600 mg by mouth See admin instructions. 300mg  in the morning, 300mg  at 4 pm, and 600mg  at bedtime   lactulose 10 GM/15ML solution Commonly known as:  CHRONULAC TAKE 45 ML BY MOUTH 2 to 3 times daily   multivitamin with minerals Tabs tablet Take 1 tablet by mouth daily.   ondansetron 4 MG tablet Commonly known as:  ZOFRAN Take 1 tablet (4 mg total) by mouth every 8 (eight) hours as needed for nausea or vomiting.   pantoprazole 40 MG tablet Commonly known as:  PROTONIX Take 1 tablet (40 mg total)  by mouth 2 (two) times daily before a meal. What changed:  when to take this   spironolactone 100 MG tablet Commonly known as:  ALDACTONE Take 2 tablets by mouth daily.   VITAMIN B-1 PO Take 1 tablet by mouth daily.       Allergies  Allergen Reactions  . Sertraline Other (See Comments)    Suicidal ideations    Consultations:  GI   Procedures/Studies: [place] Chest (pleural Effusion)  Result Date: 01/10/2018 CLINICAL DATA:  52 year old male with history of pleural effusion. Evaluate for thoracentesis. EXAM: CHEST ULTRASOUND COMPARISON:  No priors. FINDINGS: Limited sonography of the left pleural space demonstrated a moderate-sized pleural effusion. This is discussed with the patient's physician. Given the lack of respiratory distress and adequate oxygenation on room air (98% on room air), a therapeutic thoracentesis was not indicated, and there was no clinical indication for diagnostic performance of a thoracentesis. IMPRESSION: 1. Moderate left pleural effusion. Electronically Signed   By: Trudie Reed M.D.   On: 01/10/2018 11:20   Ct Abdomen Pelvis W Contrast  Result Date: 01/08/2018 CLINICAL DATA:  Acute abdominal pain. Nausea and vomiting. Hematemesis. Alcoholic cirrhosis with esophageal varices. EXAM: CT ABDOMEN AND PELVIS WITH CONTRAST TECHNIQUE: Multidetector CT imaging of the abdomen and pelvis was performed using the standard protocol following bolus administration of intravenous contrast. CONTRAST:  ISOVUE-300 IOPAMIDOL (ISOVUE-300) INJECTION 61% COMPARISON:  09/13/2017 FINDINGS: Lower Chest: Increased moderate to large left pleural effusion and left lower lobe atelectasis. Small right pleural effusion has decreased since previous study. Hepatobiliary: Hepatic cirrhosis again demonstrated. No liver masses are identified. Portal veins are patent. Recanalization of paraumbilical veins, consistent with portal venous  hypertension. Pancreas:  No mass or inflammatory changes.  Spleen: Borderline splenomegaly. Two small peripheral wedge-shaped low-attenuation lesions are seen in the lateral and posterior aspects of the spleen, which are new and consistent with small splenic infarcts. Adrenals/Urinary Tract: No masses identified. No evidence of hydronephrosis. Unremarkable unopacified urinary bladder. Stomach/Bowel: No evidence of obstruction, inflammatory process or abnormal fluid collections. Vascular/Lymphatic: No pathologically enlarged lymph nodes. No abdominal aortic aneurysm. Reproductive:  No mass or other significant abnormality. Other: Diffuse mesenteric edema and moderate ascites show mild decrease since previous study. Ascites is again seen within a hernia sac in the right inguinal canal. Musculoskeletal:  No suspicious bone lesions identified. IMPRESSION: Stable appearance of hepatic cirrhosis and findings of portal venous hypertension. No evidence of hepatic neoplasm. New small peripheral wedge-shaped splenic lesions, consistent with splenic infarcts. Moderate ascites shows mild decrease since previous study. Ascites again seen extending into right inguinal hernia sac. Increased moderate to large left pleural effusion and left lower lung atelectasis. Electronically Signed   By: Myles Rosenthal M.D.   On: 01/08/2018 18:17   Dg Chest Port 1 View  Result Date: 01/06/2018 CLINICAL DATA:  Status post intubation falling emergent EGD for esophageal varices. EXAM: PORTABLE CHEST 1 VIEW COMPARISON:  08/01/2016 FINDINGS: Endotracheal tube tip projects 3.6 cm above the carinal. There is hazy opacity throughout the left hemithorax, likely due to layering pleural fluid. Mild atelectasis is noted at the medial right lung base. Remainder the right lung is clear. Cardiac silhouette is normal in size. No pneumothorax. IMPRESSION: 1. Well-positioned endotracheal tube. 2. Hazy opacity throughout the left hemithorax. This is most likely due to layering pleural fluid. Mild right lung base  atelectasis. Electronically Signed   By: Amie Portland M.D.   On: 01/06/2018 15:52   [place] Ascites (abdomen Limited)  Result Date: 01/10/2018 CLINICAL DATA:  52 year old male with history of alcoholic cirrhosis. Ascites. EXAM: ULTRASOUND ABDOMEN LIMITED COMPARISON:  Multiple priors, most recently 06/01/2017. FINDINGS: Limited sonography of the abdomen demonstrated only a small volume of ascites, insufficient for paracentesis. IMPRESSION: Small volume of ascites. Electronically Signed   By: Trudie Reed M.D.   On: 01/10/2018 11:18        Discharge Exam: Vitals:   01/10/18 0538 01/10/18 1014  BP: 101/71 107/79  Pulse: 83 80  Resp: 18 18  Temp: 98.2 F (36.8 C)   SpO2: 95% 99%   Vitals:   01/09/18 1255 01/09/18 2137 01/10/18 0538 01/10/18 1014  BP: 100/82 107/79 101/71 107/79  Pulse: 92 88 83 80  Resp: 18 18 18 18   Temp: 97.6 F (36.4 C) 98.2 F (36.8 C) 98.2 F (36.8 C)   TempSrc: Oral Oral Oral   SpO2: 95% 95% 95% 99%  Weight:      Height:        General: Pt is alert, awake, not in acute distress Cardiovascular: RRR, S1/S2 +, no rubs, no gallops Respiratory: L-basilar crackles, no wheeze; R-CTA Abdominal: Soft, NT, ND, bowel sounds + Extremities: no edema, no cyanosis   The results of significant diagnostics from this hospitalization (including imaging, microbiology, ancillary and laboratory) are listed below for reference.    Significant Diagnostic Studies: [place] Chest (pleural Effusion)  Result Date: 01/10/2018 CLINICAL DATA:  52 year old male with history of pleural effusion. Evaluate for thoracentesis. EXAM: CHEST ULTRASOUND COMPARISON:  No priors. FINDINGS: Limited sonography of the left pleural space demonstrated a moderate-sized pleural effusion. This is discussed with the patient's physician. Given the lack of respiratory distress and adequate  oxygenation on room air (98% on room air), a therapeutic thoracentesis was not indicated, and there was no clinical  indication for diagnostic performance of a thoracentesis. IMPRESSION: 1. Moderate left pleural effusion. Electronically Signed   By: Trudie Reed M.D.   On: 01/10/2018 11:20   Ct Abdomen Pelvis W Contrast  Result Date: 01/08/2018 CLINICAL DATA:  Acute abdominal pain. Nausea and vomiting. Hematemesis. Alcoholic cirrhosis with esophageal varices. EXAM: CT ABDOMEN AND PELVIS WITH CONTRAST TECHNIQUE: Multidetector CT imaging of the abdomen and pelvis was performed using the standard protocol following bolus administration of intravenous contrast. CONTRAST:  ISOVUE-300 IOPAMIDOL (ISOVUE-300) INJECTION 61% COMPARISON:  09/13/2017 FINDINGS: Lower Chest: Increased moderate to large left pleural effusion and left lower lobe atelectasis. Small right pleural effusion has decreased since previous study. Hepatobiliary: Hepatic cirrhosis again demonstrated. No liver masses are identified. Portal veins are patent. Recanalization of paraumbilical veins, consistent with portal venous hypertension. Pancreas:  No mass or inflammatory changes. Spleen: Borderline splenomegaly. Two small peripheral wedge-shaped low-attenuation lesions are seen in the lateral and posterior aspects of the spleen, which are new and consistent with small splenic infarcts. Adrenals/Urinary Tract: No masses identified. No evidence of hydronephrosis. Unremarkable unopacified urinary bladder. Stomach/Bowel: No evidence of obstruction, inflammatory process or abnormal fluid collections. Vascular/Lymphatic: No pathologically enlarged lymph nodes. No abdominal aortic aneurysm. Reproductive:  No mass or other significant abnormality. Other: Diffuse mesenteric edema and moderate ascites show mild decrease since previous study. Ascites is again seen within a hernia sac in the right inguinal canal. Musculoskeletal:  No suspicious bone lesions identified. IMPRESSION: Stable appearance of hepatic cirrhosis and findings of portal venous hypertension. No  evidence of hepatic neoplasm. New small peripheral wedge-shaped splenic lesions, consistent with splenic infarcts. Moderate ascites shows mild decrease since previous study. Ascites again seen extending into right inguinal hernia sac. Increased moderate to large left pleural effusion and left lower lung atelectasis. Electronically Signed   By: Myles Rosenthal M.D.   On: 01/08/2018 18:17   Dg Chest Port 1 View  Result Date: 01/06/2018 CLINICAL DATA:  Status post intubation falling emergent EGD for esophageal varices. EXAM: PORTABLE CHEST 1 VIEW COMPARISON:  08/01/2016 FINDINGS: Endotracheal tube tip projects 3.6 cm above the carinal. There is hazy opacity throughout the left hemithorax, likely due to layering pleural fluid. Mild atelectasis is noted at the medial right lung base. Remainder the right lung is clear. Cardiac silhouette is normal in size. No pneumothorax. IMPRESSION: 1. Well-positioned endotracheal tube. 2. Hazy opacity throughout the left hemithorax. This is most likely due to layering pleural fluid. Mild right lung base atelectasis. Electronically Signed   By: Amie Portland M.D.   On: 01/06/2018 15:52   [place] Ascites (abdomen Limited)  Result Date: 01/10/2018 CLINICAL DATA:  52 year old male with history of alcoholic cirrhosis. Ascites. EXAM: ULTRASOUND ABDOMEN LIMITED COMPARISON:  Multiple priors, most recently 06/01/2017. FINDINGS: Limited sonography of the abdomen demonstrated only a small volume of ascites, insufficient for paracentesis. IMPRESSION: Small volume of ascites. Electronically Signed   By: Trudie Reed M.D.   On: 01/10/2018 11:18     Microbiology: Recent Results (from the past 240 hour(s))  MRSA PCR Screening     Status: None   Collection Time: 01/06/18  5:36 PM  Result Value Ref Range Status   MRSA by PCR NEGATIVE NEGATIVE Final    Comment:        The GeneXpert MRSA Assay (FDA approved for NASAL specimens only), is one component of a comprehensive MRSA  colonization surveillance program. It is not intended to diagnose MRSA infection nor to guide or monitor treatment for MRSA infections. Performed at [facility], [address] [place], [place] [address]      Labs: Basic Metabolic Panel: Recent Labs  Lab 01/06/18 1137 01/07/18 0400 01/08/18 0220 01/09/18 0442 01/10/18 0802  NA 138 139 136 135 134*  K 3.6 4.0 3.6 3.4* 3.0*  CL 101 106 105 103 100*  CO2 22 23 22  21* 22  GLUCOSE 111* 109* 122* 103* 129*  BUN 23* 18 12 8 8   CREATININE 0.53* 0.59* 0.63 0.65 0.76  CALCIUM 9.1 8.5* 8.5* 8.3* 8.2*  MG  --   --   --   --  1.7   Liver Function Tests: Recent Labs  Lab 01/06/18 1137 01/07/18 0400 01/10/18 0802  AST 44* 36 25  ALT 23 17 14*  ALKPHOS 125 99 92  BILITOT 1.6* 1.6* 1.0  PROT 7.4 6.1* 6.2*  ALBUMIN 3.7 3.1* 3.2*   Recent Labs  Lab 01/06/18 2015  LIPASE 25   No results for input(s): AMMONIA in the last 168 hours. CBC: Recent Labs  Lab 01/06/18 1137  01/07/18 0400  01/07/18 2011 01/08/18 0220 01/08/18 0808 01/09/18 0442 01/10/18 0802  WBC 8.1  --  4.4  --   --  3.8*  --  5.1 5.5  NEUTROABS 5.7  --   --   --   --   --   --   --   --   HGB 11.8*   < > 10.3*   < > 9.2* 9.7* 9.4* 9.7* 9.7*  HCT 36.7*   < > 32.0*   < > 28.5* 30.2* 29.2* 29.5* 29.9*  MCV 90.2  --  92.0  --   --  92.4  --  91.3 92.3  PLT 158  --  89*  --   --  101*  --  109* 92*   < > = values in this interval not displayed.   Cardiac Enzymes: No results for input(s): CKTOTAL, CKMB, CKMBINDEX, TROPONINI in the last 168 hours. BNP: Invalid input(s): POCBNP CBG: No results for input(s): GLUCAP in the last 168 hours.  Time coordinating discharge:  36 minutes  Signed:  Catarina Hartshornavid Lataja Newland, DO [facility] Pager: [phone] 01/10/2018, 11:49 AM

## 2018-01-09 NOTE — Progress Notes (Signed)
PROGRESS NOTE  Joseph EstimableMichael Turley ZOX:[id number] DOB: 1965/11/06 DOA: 01/06/2018 PCP: Clinton SawyerShah, Minesh R, MD  Brief History: 52 y.o.malewith medical history ofalcoholic cirrhosis, tobacco abuse, COPD, esophageal varices presenting with hematemesis that began around 2 AM on 01/06/2018. The patient had been in his usual state of health without any major complaints until 2 AM on 01/06/2018 when he woke up with some nausea and emesis. The patient denied any fevers, chills, chest pain, shortness breath, but he had complained of some dizziness without any syncope.He had denied NSAID use and any other new medications, but states that he recently started drinking alcohol again. The patient was seen by GI on the day of admission. He underwent EGD which revealed 4columns of esophageal varices which were banded. GI wanted the patient to remain intubated overnight 01/06/2018. The patient struggled with agitation requiring increasing amounts of sedation. Pulmonary medicine was consulted to assist in weaning the patient off the ventilator as the patient had a previous history of prolonged intubation and tracheostomy.  The patient was subsequently extubated successfully on 01/07/2018.  He was transitioned to room air and remained stable.   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 and resultant complications/splenomegaly -Echo to r/o embolic source -contributes  to his abd pain, but pain is out of proportion with exam -partly contributing to left pleural effusion -check lupus anticoagulant, factor V Leiden and prothrombin gene mutation -01/08/2018 CT abdomen--no liver mass; cirrhosis with portal venous hypertension; patent portal vein; new splenic infarct; moderate ascites; moderate left pleural effusion  Alcoholic liver cirrhosis with ascites -Patient hadbeen sober since January 2019, but has started drinking again -Continue thiamine -12/10/2017 alpha-fetoprotein--2.9 -restart furosemide and aldactone -last paracentesis on 11/05/17 in [place] -pt refuses lactulose -request repeat paracentesis  LUQ/Epigastric abdominal pain -check lipase--25 -CT abdomen and pelvis as dicussed above -Pain is out of proportion with exam findings -Patient has some opioid seeking behavior -change to po opioid  Acute respiratory failure with hypoxia -Patient left on mechanical ventilation overnight 01/06/18 through 01/07/18 -Appreciate pulmonary consult -Extubated 01/07/2018 --Now stable on room air  COPD -stable on RA  Thrombocytopenia -chronic and stable -due to liver cirrhosis  Alcohol abuse -drank last few days prior to admission -CIWA protocol -Continue thiamine  Hypokalemia -replete -expect improvement with restart aldactone   Disposition Plan: Home 4/15 if stable Family Communication:No family present   Consultants:GI, pulmonary  Code Status: FULL  DVT Prophylaxis:SCDs   Procedures: As Listed in Progress Note Above  Antibiotics: Ceftriaxone 4/11>>>       Subjective: Patient continues to complain of abdominal pain about the same as yesterday.  He is tolerating a full liquid diet.  He denies any nausea, vomiting, hematemesis, hematochezia, melena, headache, neck pain.  There is no chest pain shortness of breath.  He has some mild dyspnea on exertion.  Objective: Vitals:   01/09/18 0500 01/09/18 0600  01/09/18 0700 01/09/18 0734  BP: 118/79 115/86 113/84   Pulse: 91 81 92 82  Resp: 16 20 (!) 21 15  Temp:    98.7 F (37.1 C)  TempSrc:    Oral  SpO2: 95% 93% 95% 94%  Weight:      Height:        Intake/Output Summary (Last 24 hours) at 01/09/2018 0921 Last data filed at 01/09/2018 0700 Gross per 24 hour  Intake 1250 ml  Output 1275 ml  Net -25 ml   Weight change:  Exam:   General:  Pt is alert, follows commands appropriately, not in acute distress  HEENT: No icterus, No thrush, No neck mass, [place]/AT  Cardiovascular: RRR, S1/S2, no rubs, no gallops  Respiratory: Bibasilar crackles with diminished breath sounds at the bases.  Abdomen: Soft/+BS, diffusely tender, but more so in the left upper quadrant, non distended, no guarding  Extremities: trace LE edema, No lymphangitis, No petechiae, No rashes, no synovitis   Data Reviewed: I have personally reviewed following labs and imaging studies Basic Metabolic Panel: Recent Labs  Lab 01/06/18 1137 01/07/18 0400 01/08/18 0220 01/09/18 0442  NA 138 139 136 135  K 3.6 4.0 3.6 3.4*  CL 101 106 105 103  CO2 22 23 22  21*  GLUCOSE 111* 109* 122* 103*  BUN 23* 18 12 8   CREATININE 0.53* 0.59* 0.63 0.65  CALCIUM 9.1 8.5* 8.5* 8.3*   Liver Function Tests: Recent Labs  Lab 01/06/18 1137 01/07/18 0400  AST 44* 36  ALT 23 17  ALKPHOS 125 99  BILITOT 1.6* 1.6*  PROT 7.4 6.1*  ALBUMIN 3.7 3.1*   Recent Labs  Lab 01/06/18 2015  LIPASE 25   No results for input(s): AMMONIA in the last 168 hours. Coagulation Profile: Recent Labs  Lab 01/06/18 1144  INR 1.39   CBC: Recent Labs  Lab 01/06/18 1137  01/07/18 0400  01/07/18 1412 01/07/18 2011 01/08/18 0220 01/08/18 0808 01/09/18 0442  WBC 8.1  --  4.4  --   --   --  3.8*  --  5.1  NEUTROABS 5.7  --   --   --   --   --   --   --   --   HGB 11.8*   < > 10.3*   < > 10.4* 9.2* 9.7* 9.4* 9.7*  HCT 36.7*   < > 32.0*   < > 32.2* 28.5* 30.2* 29.2* 29.5*  MCV 90.2  --   92.0  --   --   --  92.4  --  91.3  PLT 158  --  89*  --   --   --  101*  --  109*   < > = values in this interval not displayed.   Cardiac Enzymes: No results for input(s): CKTOTAL, CKMB, CKMBINDEX, TROPONINI in the last 168 hours. BNP: Invalid input(s): POCBNP CBG: No results for input(s): GLUCAP in the last 168 hours. HbA1C: No results for input(s): HGBA1C in the last 72 hours. Urine analysis:    Component Value Date/Time   COLORURINE AMBER (A) 09/13/2017 1915   APPEARANCEUR CLEAR 09/13/2017 1915   LABSPEC 1.017 09/13/2017 1915   PHURINE 6.0 09/13/2017 1915   GLUCOSEU NEGATIVE 09/13/2017 1915   HGBUR NEGATIVE 09/13/2017 1915   BILIRUBINUR NEGATIVE 09/13/2017 1915   KETONESUR NEGATIVE 09/13/2017 1915   PROTEINUR NEGATIVE 09/13/2017 1915   NITRITE NEGATIVE 09/13/2017 1915   LEUKOCYTESUR NEGATIVE 09/13/2017 1915   Sepsis Labs: @LABRCNTIP (procalcitonin:4,lacticidven:4) ) Recent Results (from the past 240 hour(s))  MRSA PCR Screening     Status: None   Collection Time: 01/06/18  5:36 PM  Result Value Ref Range Status   MRSA by  PCR NEGATIVE NEGATIVE Final    Comment:        The GeneXpert MRSA Assay (FDA approved for NASAL specimens only), is one component of a comprehensive MRSA colonization surveillance program. It is not intended to diagnose MRSA infection nor to guide or monitor treatment for MRSA infections. Performed at [facility], [address], [place], [place] [address]      Scheduled Meds: . folic acid  1 mg Oral Daily  . furosemide  80 mg Oral Daily  . gabapentin  300-600 mg Oral See admin instructions  . lactulose  30 g Oral TID  . multivitamin with minerals  1 tablet Oral Daily  . multivitamin with minerals  1 tablet Oral Daily  . octreotide  50 mcg Intravenous Once  . [START ON 01/10/2018] pantoprazole  40 mg Intravenous Q12H  . thiamine  100 mg Oral Daily   Or  . thiamine  100 mg Intravenous Daily   Continuous Infusions: . cefTRIAXone  (ROCEPHIN)  IV Stopped (01/09/18 0013)  . octreotide  (SANDOSTATIN)    IV infusion 50 mcg/hr (01/09/18 6045)  . pantoprozole (PROTONIX) infusion 8 mg/hr (01/09/18 0801)    Procedures/Studies: Ct Abdomen Pelvis W Contrast  Result Date: 01/08/2018 CLINICAL DATA:  Acute abdominal pain. Nausea and vomiting. Hematemesis. Alcoholic cirrhosis with esophageal varices. EXAM: CT ABDOMEN AND PELVIS WITH CONTRAST TECHNIQUE: Multidetector CT imaging of the abdomen and pelvis was performed using the standard protocol following bolus administration of intravenous contrast. CONTRAST:  ISOVUE-300 IOPAMIDOL (ISOVUE-300) INJECTION 61% COMPARISON:  09/13/2017 FINDINGS: Lower Chest: Increased moderate to large left pleural effusion and left lower lobe atelectasis. Small right pleural effusion has decreased since previous study. Hepatobiliary: Hepatic cirrhosis again demonstrated. No liver masses are identified. Portal veins are patent. Recanalization of paraumbilical veins, consistent with portal venous hypertension. Pancreas:  No mass or inflammatory changes. Spleen: Borderline splenomegaly. Two small peripheral wedge-shaped low-attenuation lesions are seen in the lateral and posterior aspects of the spleen, which are new and consistent with small splenic infarcts. Adrenals/Urinary Tract: No masses identified. No evidence of hydronephrosis. Unremarkable unopacified urinary bladder. Stomach/Bowel: No evidence of obstruction, inflammatory process or abnormal fluid collections. Vascular/Lymphatic: No pathologically enlarged lymph nodes. No abdominal aortic aneurysm. Reproductive:  No mass or other significant abnormality. Other: Diffuse mesenteric edema and moderate ascites show mild decrease since previous study. Ascites is again seen within a hernia sac in the right inguinal canal. Musculoskeletal:  No suspicious bone lesions identified. IMPRESSION: Stable appearance of hepatic cirrhosis and findings of portal venous  hypertension. No evidence of hepatic neoplasm. New small peripheral wedge-shaped splenic lesions, consistent with splenic infarcts. Moderate ascites shows mild decrease since previous study. Ascites again seen extending into right inguinal hernia sac. Increased moderate to large left pleural effusion and left lower lung atelectasis. Electronically Signed   By: Myles Rosenthal M.D.   On: 01/08/2018 18:17   Dg Chest Port 1 View  Result Date: 01/06/2018 CLINICAL DATA:  Status post intubation falling emergent EGD for esophageal varices. EXAM: PORTABLE CHEST 1 VIEW COMPARISON:  08/01/2016 FINDINGS: Endotracheal tube tip projects 3.6 cm above the carinal. There is hazy opacity throughout the left hemithorax, likely due to layering pleural fluid. Mild atelectasis is noted at the medial right lung base. Remainder the right lung is clear. Cardiac silhouette is normal in size. No pneumothorax. IMPRESSION: 1. Well-positioned endotracheal tube. 2. Hazy opacity throughout the left hemithorax. This is most likely due to layering pleural fluid. Mild right lung base atelectasis. Electronically  Signed   By: Amie Portland M.D.   On: 01/06/2018 15:52    Catarina Hartshorn, DO  [facility] Pager [phone]  If 7PM-7AM, please contact night-coverage [email or web address] Password [email or web address] 01/09/2018, 9:21 AM   LOS: 3 days

## 2018-01-09 NOTE — Progress Notes (Signed)
No complaints this morning. He does want to eat.  CT findings reviewed.   Vital signs in last 24 hours: Temp:  [97.6 F (36.4 C)-98.7 F (37.1 C)] 98.3 F (36.8 C) (04/14 1121) Pulse Rate:  [78-115] 81 (04/14 1121) Resp:  [14-23] 18 (04/14 1121) BP: (90-126)/(41-94) 113/84 (04/14 0700) SpO2:  [93 %-99 %] 96 % (04/14 1121) Last BM Date: 01/07/18 General:   Alert,  pleasant and cooperative in NAD Abdomen:  Nondistended. Positive bowel sounds. Soft and nontender this morning  Intake/Output from previous day: 04/13 0701 - 04/14 0700 In: 1300 [I.V.:1200; IV Piggyback:100] Out: 1275 [Urine:1275] Intake/Output this shift: Total I/O In: 300 [P.O.:300] Out: -   Lab Results: Recent Labs    01/07/18 0400  01/08/18 0220 01/08/18 0808 01/09/18 0442  WBC 4.4  --  3.8*  --  5.1  HGB 10.3*   < > 9.7* 9.4* 9.7*  HCT 32.0*   < > 30.2* 29.2* 29.5*  PLT 89*  --  101*  --  109*   < > = values in this interval not displayed.   BMET Recent Labs    01/07/18 0400 01/08/18 0220 01/09/18 0442  NA 139 136 135  K 4.0 3.6 3.4*  CL 106 105 103  CO2 23 22 21*  GLUCOSE 109* 122* 103*  BUN 18 12 8   CREATININE 0.59* 0.63 0.65  CALCIUM 8.5* 8.5* 8.3*   LFT Recent Labs    01/07/18 0400  PROT 6.1*  ALBUMIN 3.1*  AST 36  ALT 17  ALKPHOS 99  BILITOT 1.6*   PT/INR No results for input(s): LABPROT, INR in the last 72 hours. Hepatitis Panel No results for input(s): HEPBSAG, HCVAB, HEPAIGM, HEPBIGM in the last 72 hours. C-Diff No results for input(s): CDIFFTOX in the last 72 hours.  Studies/Results: Ct Abdomen Pelvis W Contrast  Result Date: 01/08/2018 CLINICAL DATA:  Acute abdominal pain. Nausea and vomiting. Hematemesis. Alcoholic cirrhosis with esophageal varices. EXAM: CT ABDOMEN AND PELVIS WITH CONTRAST TECHNIQUE: Multidetector CT imaging of the abdomen and pelvis was performed using the standard protocol following bolus administration of intravenous contrast. CONTRAST:   ISOVUE-300 IOPAMIDOL (ISOVUE-300) INJECTION 61% COMPARISON:  09/13/2017 FINDINGS: Lower Chest: Increased moderate to large left pleural effusion and left lower lobe atelectasis. Small right pleural effusion has decreased since previous study. Hepatobiliary: Hepatic cirrhosis again demonstrated. No liver masses are identified. Portal veins are patent. Recanalization of paraumbilical veins, consistent with portal venous hypertension. Pancreas:  No mass or inflammatory changes. Spleen: Borderline splenomegaly. Two small peripheral wedge-shaped low-attenuation lesions are seen in the lateral and posterior aspects of the spleen, which are new and consistent with small splenic infarcts. Adrenals/Urinary Tract: No masses identified. No evidence of hydronephrosis. Unremarkable unopacified urinary bladder. Stomach/Bowel: No evidence of obstruction, inflammatory process or abnormal fluid collections. Vascular/Lymphatic: No pathologically enlarged lymph nodes. No abdominal aortic aneurysm. Reproductive:  No mass or other significant abnormality. Other: Diffuse mesenteric edema and moderate ascites show mild decrease since previous study. Ascites is again seen within a hernia sac in the right inguinal canal. Musculoskeletal:  No suspicious bone lesions identified. IMPRESSION: Stable appearance of hepatic cirrhosis and findings of portal venous hypertension. No evidence of hepatic neoplasm. New small peripheral wedge-shaped splenic lesions, consistent with splenic infarcts. Moderate ascites shows mild decrease since previous study. Ascites again seen extending into right inguinal hernia sac. Increased moderate to large left pleural effusion and left lower lung atelectasis. Electronically Signed   By: Alver Sorrow.D.  On: 01/08/2018 18:17     Impression:  EtOH cirrhosis with upper GI bleed secondary to esophageal varices status post band ligation. Bleeding appears to have ceased. Recent abdominal pain-better today. Splenic  infarct on CT.  This finding is being evaluated further. Not present on December 2018 scan. However, [place] and suffered significant blunt force trauma. This may be a chronic finding.  Recommendations:  Advance diet. Repeat EGD in about one month from now. Discontinue Sandostatin and IV PPI.  Antibiotics for 5 more days per hospitalist.   Oral PPI once daily.  2 g sodium diet Office follow-up with us in about 4 weeks.

## 2018-01-09 NOTE — Progress Notes (Signed)
*  PRELIMINARY RESULTS* Echocardiogram 2D Echocardiogram has been performed.  Stacey DrainWhite, Trinna Kunst J 01/09/2018, 11:01 AM

## 2018-01-10 ENCOUNTER — Telehealth: Payer: Self-pay | Admitting: Gastroenterology

## 2018-01-10 ENCOUNTER — Inpatient Hospital Stay (HOSPITAL_COMMUNITY): Payer: Medicaid - Out of State

## 2018-01-10 DIAGNOSIS — I8511 Secondary esophageal varices with bleeding: Secondary | ICD-10-CM

## 2018-01-10 LAB — BPAM RBC
Blood Product Expiration Date: 201905032359
Blood Product Expiration Date: 201905172359
UNIT TYPE AND RH: 9500
Unit Type and Rh: 9500

## 2018-01-10 LAB — COMPREHENSIVE METABOLIC PANEL
ALK PHOS: 92 U/L (ref 38–126)
ALT: 14 U/L — ABNORMAL LOW (ref 17–63)
AST: 25 U/L (ref 15–41)
Albumin: 3.2 g/dL — ABNORMAL LOW (ref 3.5–5.0)
Anion gap: 12 (ref 5–15)
BILIRUBIN TOTAL: 1 mg/dL (ref 0.3–1.2)
BUN: 8 mg/dL (ref 6–20)
CALCIUM: 8.2 mg/dL — AB (ref 8.9–10.3)
CO2: 22 mmol/L (ref 22–32)
CREATININE: 0.76 mg/dL (ref 0.61–1.24)
Chloride: 100 mmol/L — ABNORMAL LOW (ref 101–111)
GFR calc Af Amer: >60 mL/min (ref >60)
GFR calc non Af Amer: >60 mL/min (ref >60)
GLUCOSE: 129 mg/dL — AB (ref 65–99)
Potassium: 3 mmol/L — ABNORMAL LOW (ref 3.5–5.1)
Sodium: 134 mmol/L — ABNORMAL LOW (ref 135–145)
TOTAL PROTEIN: 6.2 g/dL — AB (ref 6.5–8.1)

## 2018-01-10 LAB — LACTATE DEHYDROGENASE: LDH: 120 U/L (ref 98–192)

## 2018-01-10 LAB — TYPE AND SCREEN
ABO/RH(D): O NEG
Antibody Screen: NEGATIVE
UNIT DIVISION: 0
Unit division: 0

## 2018-01-10 LAB — MAGNESIUM: MAGNESIUM: 1.7 mg/dL (ref 1.7–2.4)

## 2018-01-10 LAB — CBC
HCT: 29.9 % — ABNORMAL LOW (ref 39.0–52.0)
Hemoglobin: 9.7 g/dL — ABNORMAL LOW (ref 13.0–17.0)
MCH: 29.9 pg (ref 26.0–34.0)
MCHC: 32.4 g/dL (ref 30.0–36.0)
MCV: 92.3 fL (ref 78.0–100.0)
PLATELETS: 92 10*3/uL — AB (ref 150–400)
RBC: 3.24 MIL/uL — ABNORMAL LOW (ref 4.22–5.81)
RDW: 15.9 % — AB (ref 11.5–15.5)
WBC: 5.5 10*3/uL (ref 4.0–10.5)

## 2018-01-10 MED ORDER — CIPROFLOXACIN HCL 500 MG PO TABS: 500.0000 mg | ORAL_TABLET | Freq: Two times a day (BID) | ORAL | 0 refills | Status: DC

## 2018-01-10 MED ORDER — POTASSIUM CHLORIDE CRYS ER 20 MEQ PO TBCR
40.0000 meq | EXTENDED_RELEASE_TABLET | Freq: Once | ORAL | Status: AC
  Administered 2018-01-10: 40 meq via ORAL
  Filled 2018-01-10: qty 2

## 2018-01-10 MED ORDER — OCTREOTIDE ACETATE 500 MCG/ML IJ SOLN
INTRAMUSCULAR | Status: AC
  Filled 2018-01-10: qty 1

## 2018-01-10 MED ORDER — PANTOPRAZOLE SODIUM 40 MG PO TBEC
40.0000 mg | DELAYED_RELEASE_TABLET | Freq: Every day | ORAL | Status: DC
  Administered 2018-01-10: 40 mg via ORAL
  Filled 2018-01-10: qty 1

## 2018-01-10 NOTE — Progress Notes (Addendum)
Subjective: Noted black stool yesterday but states he has been seeing this since admission. Abdominal pain at baseline. No N/V. Mild shortness of breath. No confusion, mental status changes.   Objective: Vital signs in last 24 hours: Temp:  [97.6 F (36.4 C)-98.3 F (36.8 C)] 98.2 F (36.8 C) (04/15 0538) Pulse Rate:  [81-92] 83 (04/15 0538) Resp:  [18] 18 (04/15 0538) BP: (100-107)/(71-82) 101/71 (04/15 0538) SpO2:  [95 %-96 %] 95 % (04/15 0538) Last BM Date: 01/07/18 General:   Alert and oriented, pleasant Lungs: Clear to auscultation bilaterally, left lower lobe diminished bases, no wheezing  Abdomen:  Bowel sounds present, non-tense ascites, no rebound or guarding Extremities:  Without  edema. Neurologic:  Alert and  oriented x4. Psych:  Alert and cooperative. Normal mood and affect.  Intake/Output from previous day: 04/14 0701 - 04/15 0700 In: 1358.3 [P.O.:780; I.V.:578.3] Out: 700 [Urine:700] Intake/Output this shift: No intake/output data recorded.  Lab Results: Recent Labs    01/08/18 0220 01/08/18 0808 01/09/18 0442  WBC 3.8*  --  5.1  HGB 9.7* 9.4* 9.7*  HCT 30.2* 29.2* 29.5*  PLT 101*  --  109*   BMET Recent Labs    01/08/18 0220 01/09/18 0442  NA 136 135  K 3.6 3.4*  CL 105 103  CO2 22 21*  GLUCOSE 122* 103*  BUN 12 8  CREATININE 0.63 0.65  CALCIUM 8.5* 8.3*   Lab Results  Component Value Date   ALT 14 (L) 01/10/2018   AST 25 01/10/2018   ALKPHOS 92 01/10/2018   BILITOT 1.0 01/10/2018     Studies/Results: Ct Abdomen Pelvis W Contrast  Result Date: 01/08/2018 CLINICAL DATA:  Acute abdominal pain. Nausea and vomiting. Hematemesis. Alcoholic cirrhosis with esophageal varices. EXAM: CT ABDOMEN AND PELVIS WITH CONTRAST TECHNIQUE: Multidetector CT imaging of the abdomen and pelvis was performed using the standard protocol following bolus administration of intravenous contrast. CONTRAST:  100mL ISOVUE-300 IOPAMIDOL (ISOVUE-300) INJECTION 61%  COMPARISON:  09/13/2017 FINDINGS: Lower Chest: Increased moderate to large left pleural effusion and left lower lobe atelectasis. Small right pleural effusion has decreased since previous study. Hepatobiliary: Hepatic cirrhosis again demonstrated. No liver masses are identified. Portal veins are patent. Recanalization of paraumbilical veins, consistent with portal venous hypertension. Pancreas:  No mass or inflammatory changes. Spleen: Borderline splenomegaly. Two small peripheral wedge-shaped low-attenuation lesions are seen in the lateral and posterior aspects of the spleen, which are new and consistent with small splenic infarcts. Adrenals/Urinary Tract: No masses identified. No evidence of hydronephrosis. Unremarkable unopacified urinary bladder. Stomach/Bowel: No evidence of obstruction, inflammatory process or abnormal fluid collections. Vascular/Lymphatic: No pathologically enlarged lymph nodes. No abdominal aortic aneurysm. Reproductive:  No mass or other significant abnormality. Other: Diffuse mesenteric edema and moderate ascites show mild decrease since previous study. Ascites is again seen within a hernia sac in the right inguinal canal. Musculoskeletal:  No suspicious bone lesions identified. IMPRESSION: Stable appearance of hepatic cirrhosis and findings of portal venous hypertension. No evidence of hepatic neoplasm. New small peripheral wedge-shaped splenic lesions, consistent with splenic infarcts. Moderate ascites shows mild decrease since previous study. Ascites again seen extending into right inguinal hernia sac. Increased moderate to large left pleural effusion and left lower lung atelectasis. Electronically Signed   By: Myles RosenthalJohn  Stahl M.D.   On: 01/08/2018 18:17    Assessment: 52 year old male with history of cirrhosis secondary to ETOH presenting with variceal bleed s/p band ligation. Abdominal pain at baseline. Non-tense moderate ascites on exam. CT  on 4/13 with increased moderate to large  left pleural effusion. Splenic infarct evaluated by hospitalist. Appears ultrasound para and thoracentesis ordered per hospitalist. Recommend titrating diuretic therapy as tolerated, as he was on 80 mg lasix and 200 aldactone as outpatient. May discontinue sandostatin but continue antibiotic therapy for additional 4 days: this may be switched to oral route.   Plan: Discontinue sandostatin PPI oral daily May change IV antibiotics to oral route for a total of 5 days 2 gram sodium diet Lasix 80 mg and aldactone 100 mg daily: may need to titrate aldactone to 200 mg if tolerates Follow-up on pending labs from today Follow-up on pending para/thoracentesis per hospitalist Will arrange close outpatient follow-up and will need EGD in 4 weeks   Gelene Mink, PhD, ANP-BC [facility]    LOS: 4 days    01/10/2018, 8:13 AM

## 2018-01-10 NOTE — Progress Notes (Signed)
Discharge instructions gone over with patient, verbalized understanding. IV's removed by NT's, patient tolerated procedure well.

## 2018-01-10 NOTE — Telephone Encounter (Signed)
Please arrange outpatient hospital follow-up with me in 2 weeks, can use urgent. We will need to arrange EGD.

## 2018-01-10 NOTE — Discharge Instructions (Signed)
1. Follow up with PCP in 1-2 weeks °2. Please obtain BMP/CBC in one week ° ° ° °

## 2018-01-11 ENCOUNTER — Encounter: Payer: Self-pay | Admitting: Internal Medicine

## 2018-01-11 ENCOUNTER — Encounter (HOSPITAL_COMMUNITY): Payer: Self-pay | Admitting: Internal Medicine

## 2018-01-11 LAB — LUPUS ANTICOAGULANT PANEL
DRVVT: 30.5 s (ref 0.0–47.0)
PTT Lupus Anticoagulant: 29.6 s (ref 0.0–51.9)

## 2018-01-11 NOTE — Telephone Encounter (Signed)
PATIENT SCHEDULED AND LETTER SENT  °

## 2018-01-14 LAB — FACTOR 5 LEIDEN

## 2018-01-17 LAB — PROTHROMBIN GENE MUTATION

## 2018-02-01 ENCOUNTER — Encounter (HOSPITAL_COMMUNITY): Payer: Self-pay

## 2018-02-01 ENCOUNTER — Encounter (HOSPITAL_COMMUNITY)
Admission: RE | Admit: 2018-02-01 | Discharge: 2018-02-01 | Disposition: A | Discharge status: Home / Self Care | Payer: Medicaid Other | Source: Ambulatory Visit | Attending: Internal Medicine | Admitting: Internal Medicine

## 2018-02-01 ENCOUNTER — Other Ambulatory Visit: Payer: Self-pay

## 2018-02-01 ENCOUNTER — Ambulatory Visit (INDEPENDENT_AMBULATORY_CARE_PROVIDER_SITE_OTHER): Payer: Medicaid Other | Admitting: Gastroenterology

## 2018-02-01 ENCOUNTER — Encounter: Payer: Self-pay | Admitting: Gastroenterology

## 2018-02-01 VITALS — BP 117/77 | HR 81 | Temp 97.1°F | Ht 68.0 in | Wt 184.4 lb

## 2018-02-01 DIAGNOSIS — Z8719 Personal history of other diseases of the digestive system: Secondary | ICD-10-CM

## 2018-02-01 DIAGNOSIS — Z01812 Encounter for preprocedural laboratory examination: Secondary | ICD-10-CM | POA: Insufficient documentation

## 2018-02-01 DIAGNOSIS — K7030 Alcoholic cirrhosis of liver without ascites: Secondary | ICD-10-CM

## 2018-02-01 DIAGNOSIS — Z01810 Encounter for preprocedural cardiovascular examination: Secondary | ICD-10-CM | POA: Diagnosis not present

## 2018-02-01 HISTORY — DX: Gastro-esophageal reflux disease without esophagitis: K21.9

## 2018-02-01 LAB — CBC WITH DIFFERENTIAL/PLATELET
BASOS ABS: 0.1 10*3/uL (ref 0.0–0.1)
Basophils Relative: 1 %
Eosinophils Absolute: 0.1 10*3/uL (ref 0.0–0.7)
Eosinophils Relative: 2 %
HEMATOCRIT: 30.8 % — AB (ref 39.0–52.0)
HEMOGLOBIN: 9.8 g/dL — AB (ref 13.0–17.0)
LYMPHS PCT: 18 %
Lymphs Abs: 1 10*3/uL (ref 0.7–4.0)
MCH: 27.5 pg (ref 26.0–34.0)
MCHC: 31.8 g/dL (ref 30.0–36.0)
MCV: 86.3 fL (ref 78.0–100.0)
MONO ABS: 0.7 10*3/uL (ref 0.1–1.0)
MONOS PCT: 11 %
NEUTROS ABS: 3.9 10*3/uL (ref 1.7–7.7)
NEUTROS PCT: 68 %
Platelets: 165 10*3/uL (ref 150–400)
RBC: 3.57 MIL/uL — ABNORMAL LOW (ref 4.22–5.81)
RDW: 16.2 % — ABNORMAL HIGH (ref 11.5–15.5)
WBC: 5.8 10*3/uL (ref 4.0–10.5)

## 2018-02-01 LAB — BASIC METABOLIC PANEL
Anion gap: 9 (ref 5–15)
BUN: 9 mg/dL (ref 6–20)
CHLORIDE: 104 mmol/L (ref 101–111)
CO2: 23 mmol/L (ref 22–32)
Calcium: 9.1 mg/dL (ref 8.9–10.3)
Creatinine, Ser: 0.73 mg/dL (ref 0.61–1.24)
GFR calc Af Amer: >60 mL/min (ref >60)
GFR calc non Af Amer: >60 mL/min (ref >60)
GLUCOSE: 96 mg/dL (ref 65–99)
Potassium: 3.7 mmol/L (ref 3.5–5.1)
Sodium: 136 mmol/L (ref 135–145)

## 2018-02-01 NOTE — Patient Instructions (Signed)
Called [facility], no PA needed for EGD. Ref# [id number].

## 2018-02-01 NOTE — Progress Notes (Signed)
CC'D TO PCP °
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
## 2018-02-01 NOTE — Patient Instructions (Addendum)
20    Your procedure is scheduled on: 02/03/2018  Report to [facility] at   8:30   AM.  Call this number if you have problems the morning of surgery:[phone]   Remember:   Do not drink or eat food:After Midnight.    .  Take these medicines the morning of surgery with A SIP OF WATER: Protonix, elavil, antabuse, neurontin, zofran, protonix.   Do not wear jewelry, make-up or nail polish.  Do not wear lotions, powders, or perfumes. You may wear deodorant.  Do not shave 48 hours prior to surgery. Men may shave face and neck.  Do not bring valuables to the hospital.  Contacts, dentures or bridgework may not be worn into surgery.  Leave suitcase in the car. After surgery it may be brought to your room.  For patients admitted to the hospital, checkout time is 11:00 AM the day of discharge.   Patients discharged the day of surgery will not be allowed to drive home.  Name and phone number of your driver:    Please read over the following fact sheets that you were given: Pain Booklet, Lab Information and Anesthesia Post-op Instructions   Endoscopy Care After Please read the instructions outlined below and refer to this sheet in the next few weeks. These discharge instructions provide you with general information on caring for yourself after you leave the hospital. Your doctor may also give you specific instructions. While your treatment has been planned according to the most current medical practices available, unavoidable complications occasionally occur. If you have any problems or questions after discharge, please call your doctor. HOME CARE INSTRUCTIONS Activity  You may resume your regular activity but move at a slower pace for the next 24 hours.   Take frequent rest periods for the next 24 hours.   Walking will help expel (get rid of) the air and reduce the bloated feeling in your abdomen.   No driving for 24 hours (because of the anesthesia (medicine) used during the test).    You may shower.   Do not sign any important legal documents or operate any machinery for 24 hours (because of the anesthesia used during the test).  Nutrition  Drink plenty of fluids.   You may resume your normal diet.   Begin with a light meal and progress to your normal diet.   Avoid alcoholic beverages for 24 hours or as instructed by your caregiver.  Medications You may resume your normal medications unless your caregiver tells you otherwise. What you can expect today  You may experience abdominal discomfort such as a feeling of fullness or "gas" pains.   You may experience a sore throat for 2 to 3 days. This is normal. Gargling with salt water may help this.  Follow-up Your doctor will discuss the results of your test with you. SEEK IMMEDIATE MEDICAL CARE IF:  You have excessive nausea (feeling sick to your stomach) and/or vomiting.   You have severe abdominal pain and distention (swelling).   You have trouble swallowing.   You have a temperature over 100 F (37.8 C).   You have rectal bleeding or vomiting of blood.  Document Released: 04/28/2004 Document Revised: 09/03/2011 Document Reviewed: 11/09/2007

## 2018-02-01 NOTE — Patient Instructions (Signed)
We have scheduled an upper endoscopy with Dr. Jena Gauss on Feb 03, 2018.  It was good to see you!  It was a pleasure to see you today. I strive to create trusting relationships with patients to provide genuine, compassionate, and quality care. I value your feedback. If you receive a survey regarding your visit,  I greatly appreciate you taking time to fill this out.   Gelene Mink, PhD, ANP-BC [facility]
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
## 2018-02-02 ENCOUNTER — Telehealth: Payer: Self-pay

## 2018-02-02 NOTE — Telephone Encounter (Addendum)
Jasmine December at pre-service center called office and LMOVM. When she called [employer], she was told pt needs PA for EGD because we are not in network.   I was told yesterday when I called [employer] no PA was needed as long as we were in network. Intake rep didn't ask info re: facility. Called [employer] this morning. Rep unable to locate [facility] in their system. The NPI# and tax ID# for [facility] comes up as a group. She is unable to process PA under a group. Rep suggested calling billing office or have office manager contact providers relation rep. Providers relation rep should contact [employer] to update their system. Informed JL in CM's absence.

## 2018-02-02 NOTE — Telephone Encounter (Signed)
Joseph Cortez at pre-service center. Informed her EGD was cancelled.

## 2018-02-02 NOTE — Telephone Encounter (Addendum)
JL called [employer]. Summer advised [facility] isn't in network. If pt wants [facility] to do procedure his plan would have to review PA, but decision couldn't be made by tomorrow [phone]). Called pt and informed him EGD for tomorrow will have to be cancelled d/t unable to obtain PA at this time. States he may have to do procedure at [facility]. Called and informed Endo scheduler.

## 2018-02-03 ENCOUNTER — Ambulatory Visit (HOSPITAL_COMMUNITY): Admission: RE | Admit: 2018-02-03 | Payer: Medicaid Other | Source: Ambulatory Visit | Admitting: Internal Medicine

## 2018-02-03 ENCOUNTER — Encounter (HOSPITAL_COMMUNITY): Admission: RE | Payer: Self-pay | Source: Ambulatory Visit

## 2018-02-03 SURGERY — ESOPHAGOGASTRODUODENOSCOPY (EGD) WITH PROPOFOL
Anesthesia: Monitor Anesthesia Care

## 2018-02-13 IMAGING — DX DG SHOULDER 2+V*L*
2 series · 2 of 2 positions shown · non-contrast
Comparison: None

CLINICAL DATA: Increasing LEFT shoulder pain since last [REDACTED]

EXAM:
LEFT SHOULDER - 2+ VIEW

[shoulder grashey]
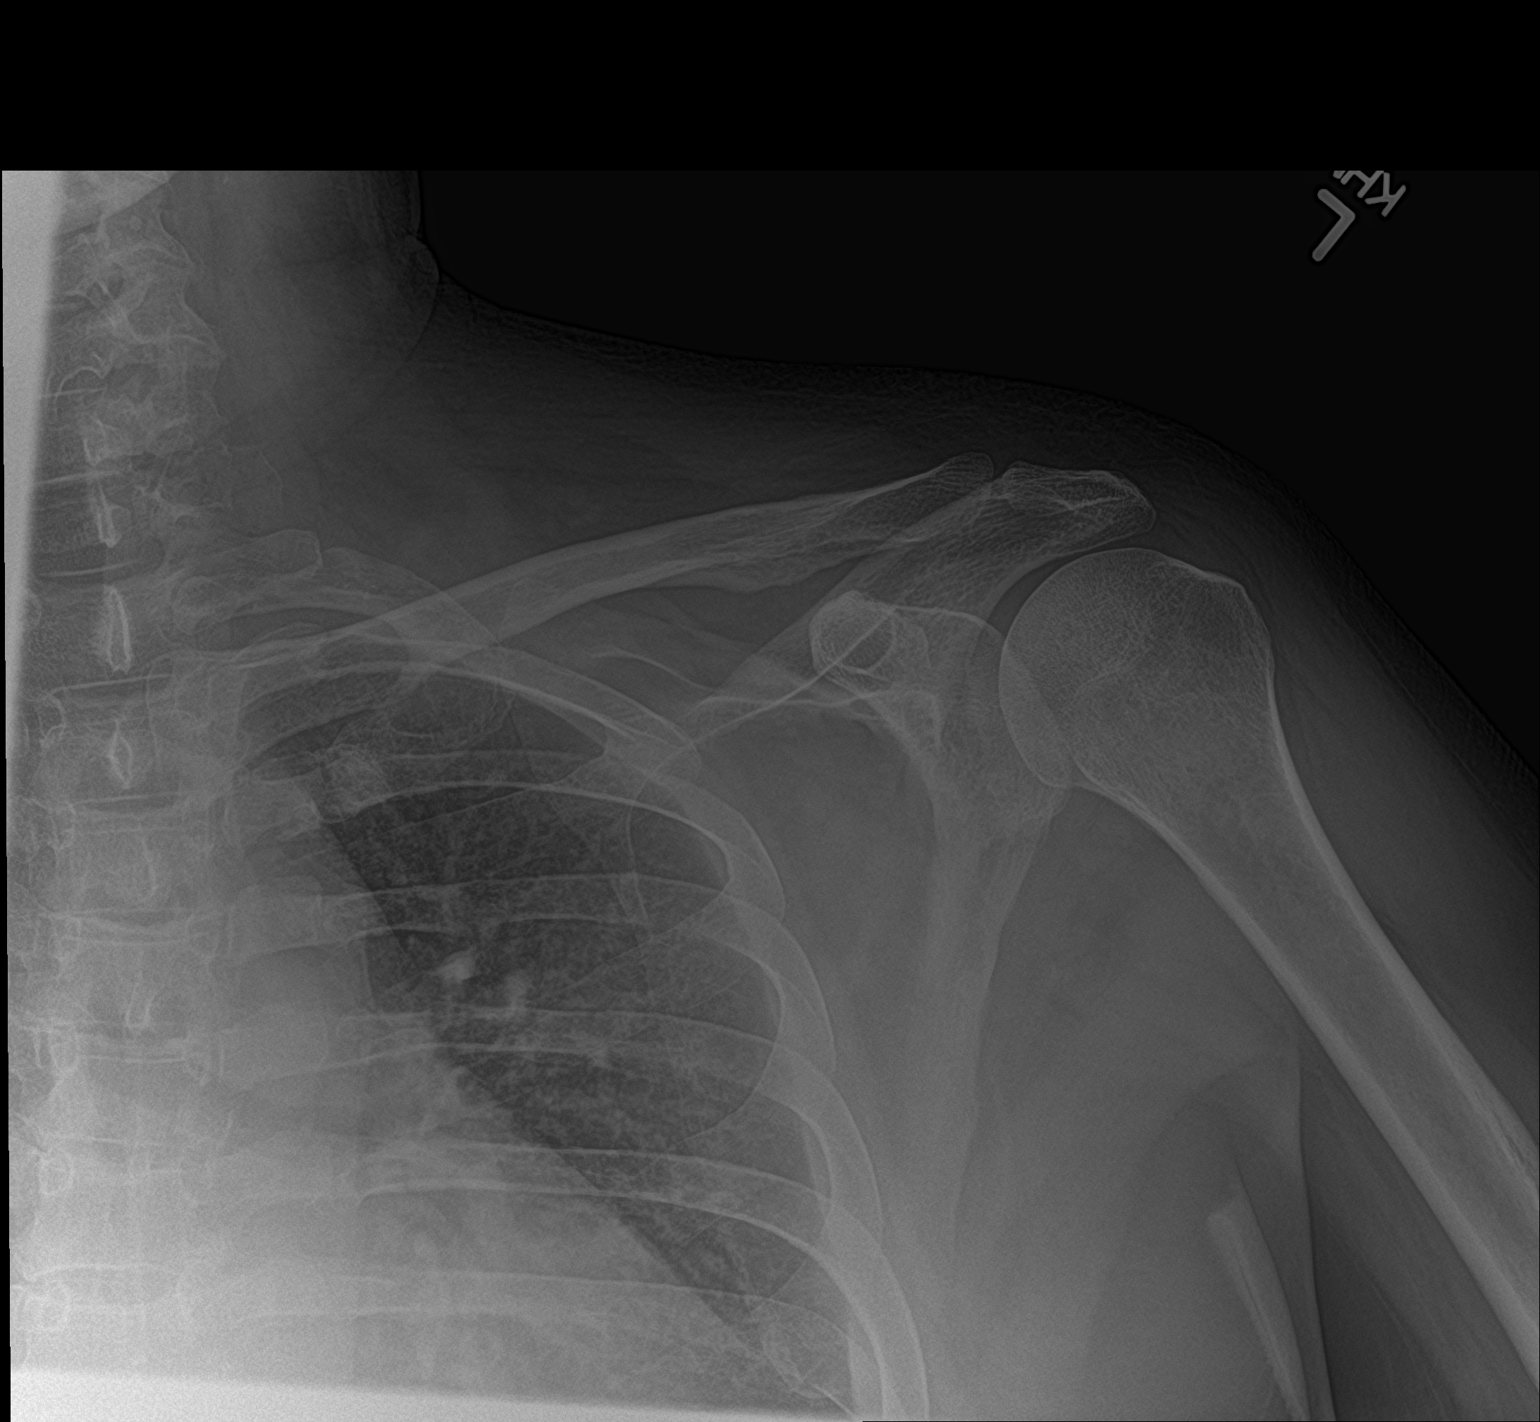

[shoulder axillary]
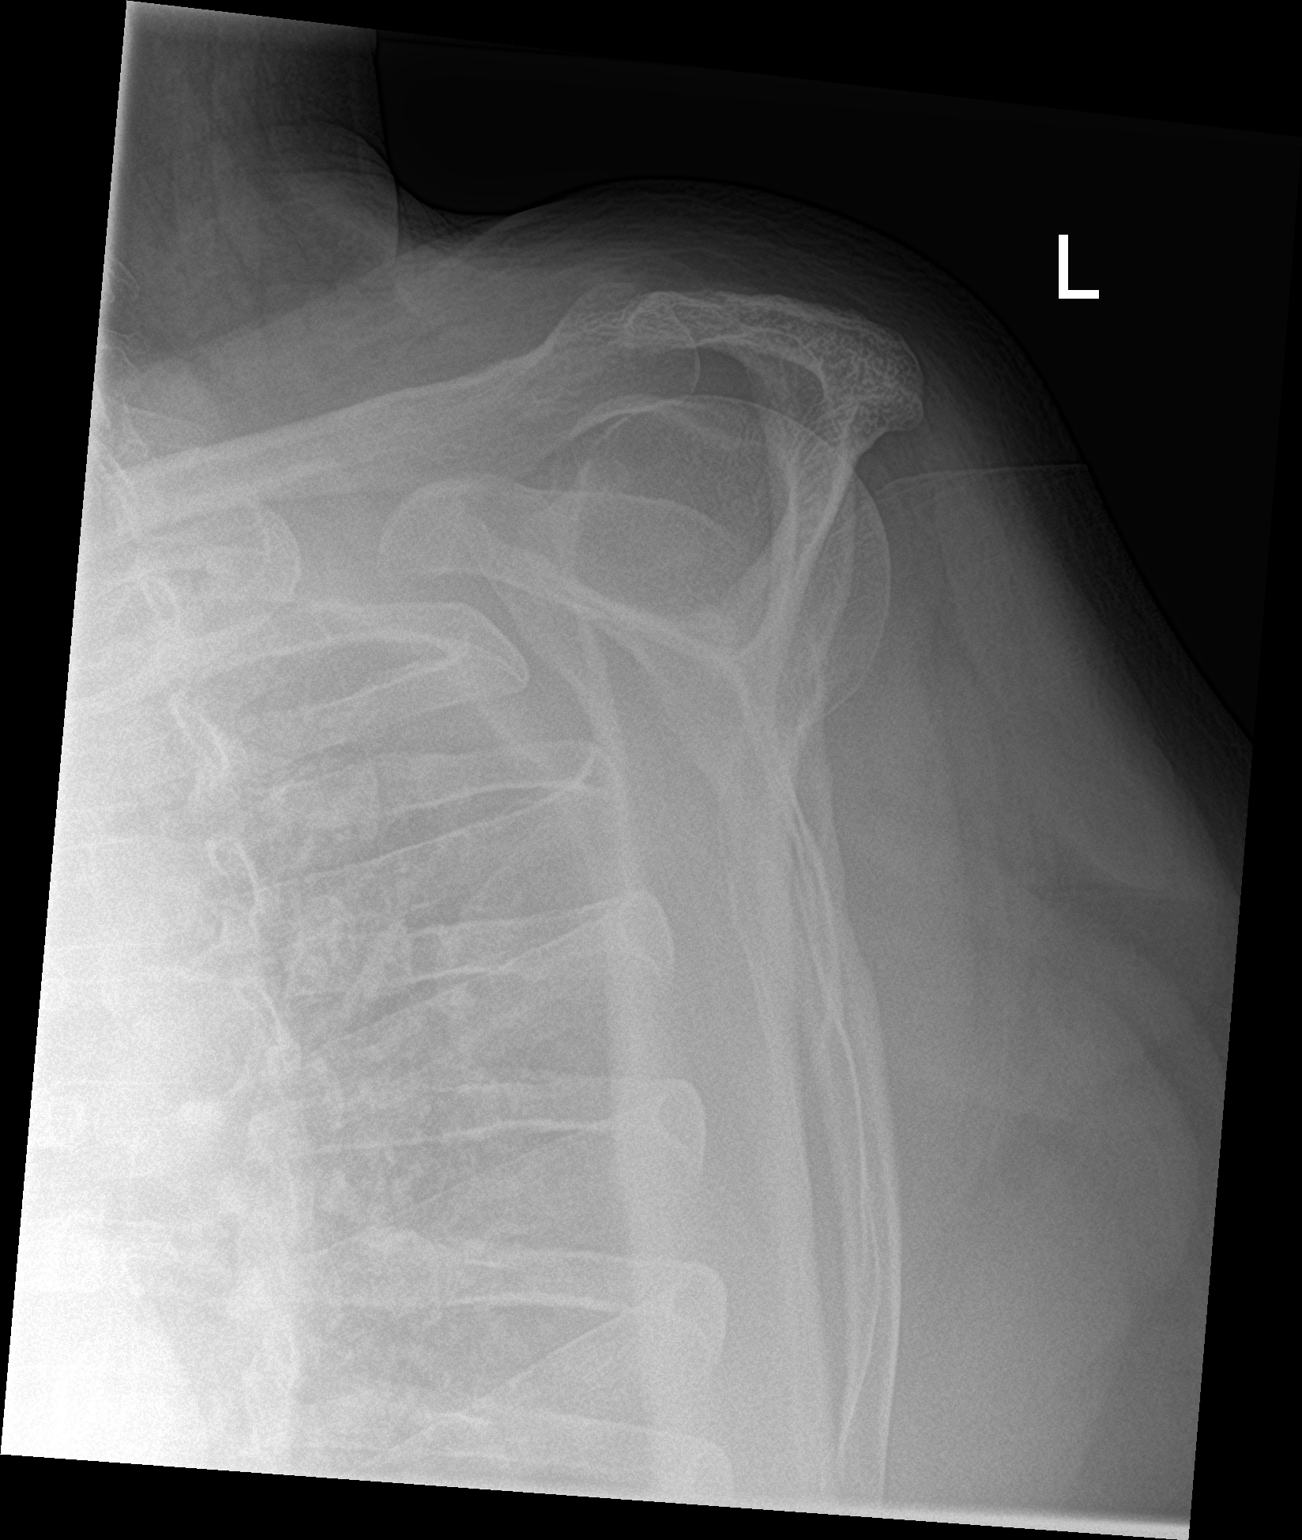

[2 of 2 positions shown; findings below may reference images not displayed]

FINDINGS: Diffuse osseous demineralization.

AC joint alignment normal.

No acute fracture, dislocation, or bone destruction.
IMPRESSION: No acute osseous abnormalities.

## 2018-02-16 NOTE — Telephone Encounter (Signed)
EGD is scheduled for June 2019 at outside facility. Hernia repair also scheduled but after EGD. He will be continuing care at the [facility], followed by DR. Sahebjam.

## 2018-03-07 ENCOUNTER — Telehealth: Payer: Self-pay | Admitting: Internal Medicine

## 2018-03-07 NOTE — Telephone Encounter (Signed)
PATIENT NEEDS A PARA AND HAS QUESTION S ABOUT HAVING ONE HERE WITH HIS NEW INSURANCE [phone]   PLEASE CALL ASAP

## 2018-03-07 NOTE — Telephone Encounter (Signed)
Spoke with pt and he said that [place] was [place]. He use to get his paracentesis at [facility] and is wondering if this is an emergency situation, can insurance cover his paracentesis at [facility]. Pt sates it needs to be done today due to the fluid buildup he has.

## 2018-03-07 NOTE — Telephone Encounter (Signed)
Called pt, advised him to contact [facility] for para. [facility] is out of network with his insurance. AB had noted pt would be continuing his care at the [facility].

## 2018-05-18 ENCOUNTER — Telehealth: Payer: Self-pay

## 2018-05-18 NOTE — Telephone Encounter (Signed)
I declined refill because we are not managing his care any longer: he is being seen in [place] by Dr. Valentina GuFarhad Sahebjam. Please let pharmacy know.

## 2018-05-18 NOTE — Telephone Encounter (Signed)
Noted. Pharmacy notified.

## 2018-05-18 NOTE — Telephone Encounter (Signed)
Refill request received for Lactulose 10 GM/15 ML SOL, QTY 3784 ML, Take 45 ML po 2 to 3 times daily from [facility] [address] [place]

## 2018-05-21 IMAGING — US US PARACENTESIS
1 series · 2 of 2 positions shown · non-contrast
Comparison: none

INDICATION: Cirrhosis, ascites

[Series 1: us paracentesis · 0.21mm/px · 2 of 2 slices shown]
[im 1/2]
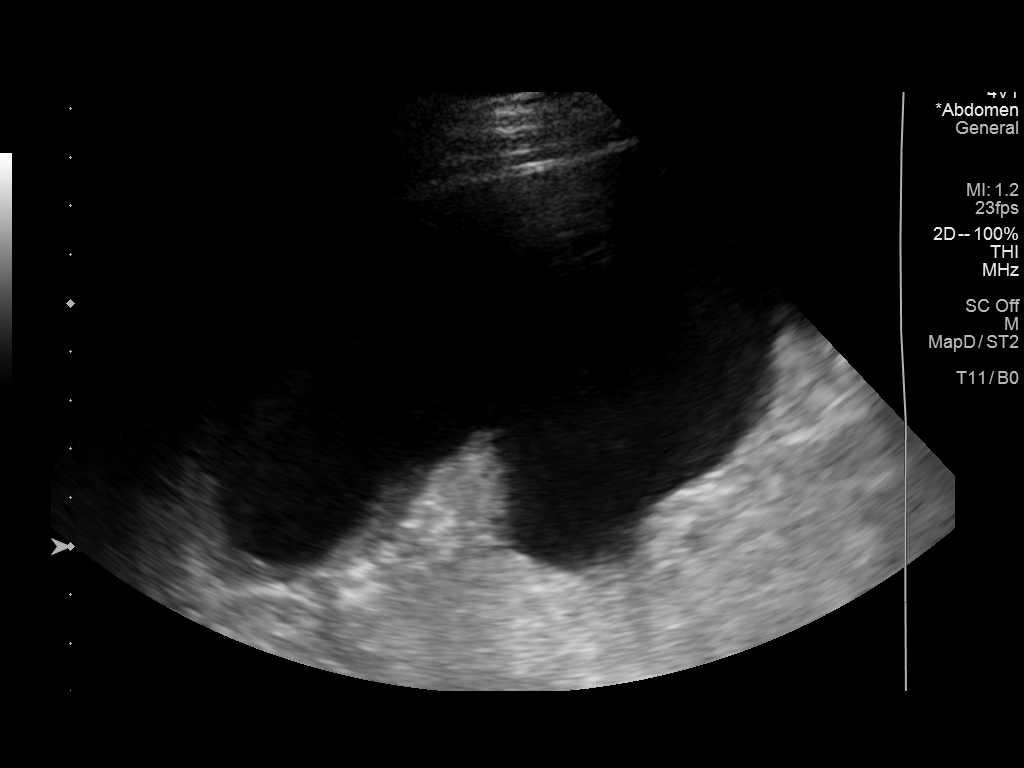
[im 2/2]
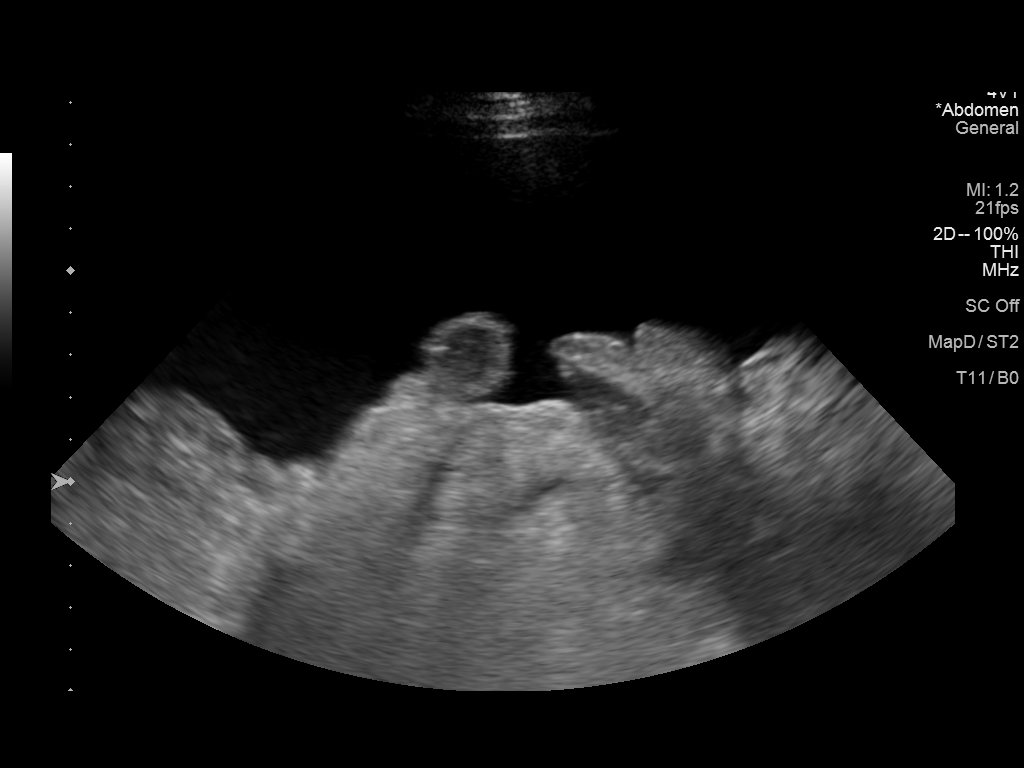

[2 of 2 positions shown; findings below may reference images not displayed]

EXAM:
ULTRASOUND GUIDED THERAPEUTIC PARACENTESIS

MEDICATIONS:
None.

COMPLICATIONS:
None immediate.

PROCEDURE:
Procedure, benefits, and risks of procedure were discussed with
patient.

Written informed consent for procedure was obtained.

Time out protocol followed.

Adequate collection of ascites localized by ultrasound in RIGHT
lower quadrant.

Skin prepped and draped in usual sterile fashion.

Skin and soft tissues anesthetized with 10 mL of 1% lidocaine.

5 French Yueh catheter placed into peritoneal cavity.

6 L of yellow fluid aspirated by vacuum bottle suction.

Procedure tolerated well by patient without immediate complication.
FINDINGS: As above
IMPRESSION: Successful ultrasound-guided paracentesis yielding 6 liters of
peritoneal fluid.

## 2018-06-01 NOTE — Telephone Encounter (Signed)
Received another request for Lactulose refill. I called [facility] in [place] and spoke to [place]. I told her we had notified them in August that we are no longer managing his care.  She made a note not to send refill request to [place] again and wrote down the doctor's name to send to.

## 2018-09-15 IMAGING — US US PARACENTESIS
1 series · 3 of 3 positions shown · non-contrast
Comparison: none

INDICATION: History of alcoholic cirrhosis recurrent abdominal ascites. Request
is made for therapeutic paracentesis.

[Series 1: us paracentesis · 0.22mm/px · 3 of 3 slices shown]
[im 1/3]
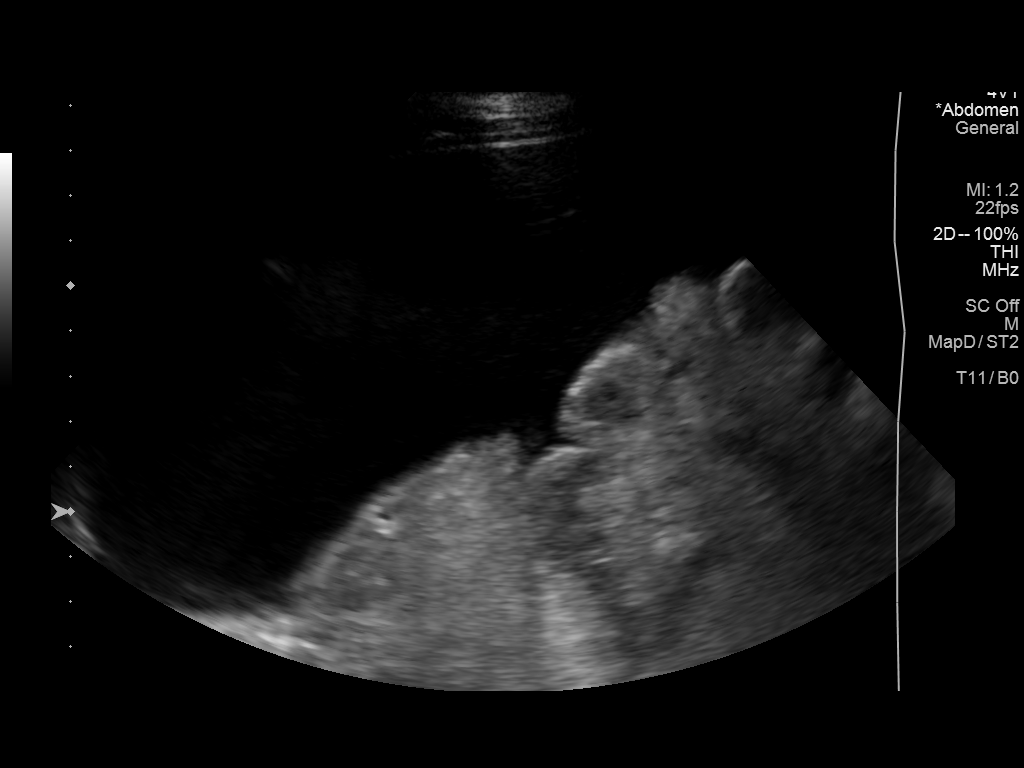
[im 2/3]
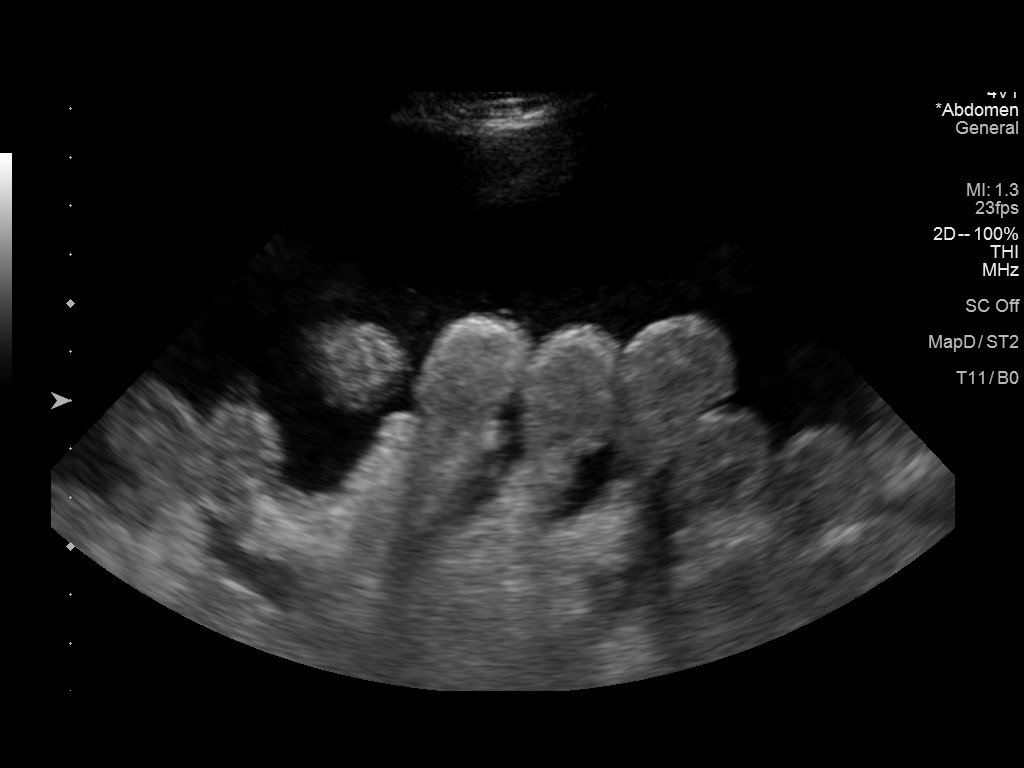
[im 3/3]
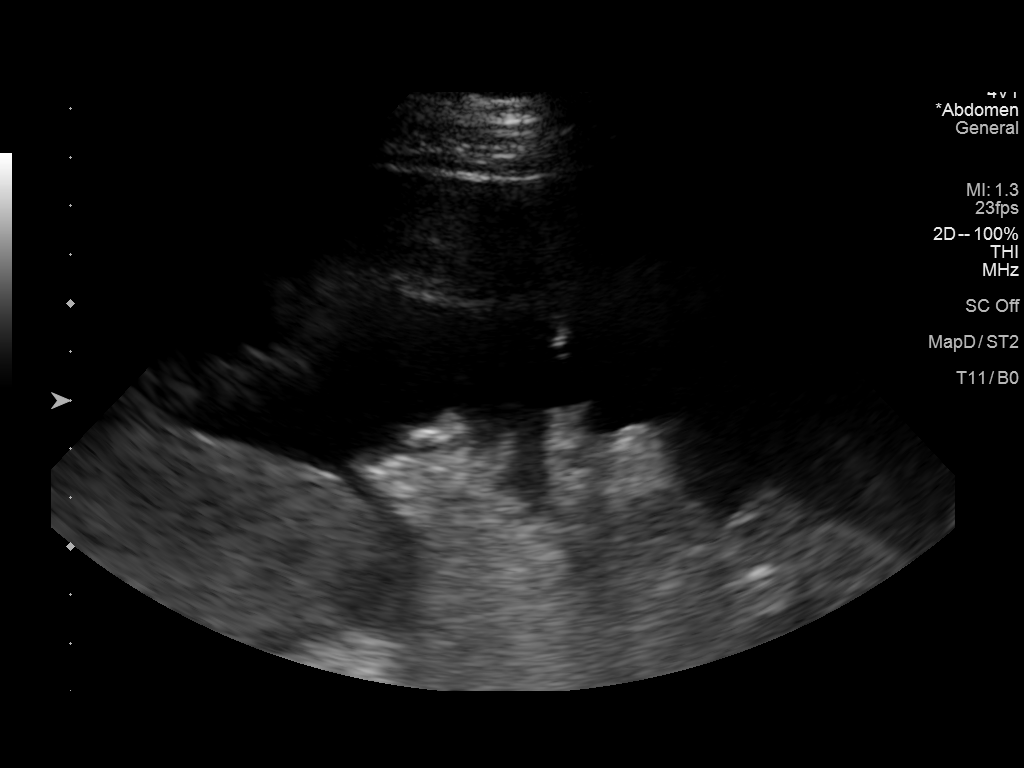

[3 of 3 positions shown; findings below may reference images not displayed]

EXAM:
ULTRASOUND GUIDED THERAPEUTIC PARACENTESIS

MEDICATIONS:
1% lidocaine

COMPLICATIONS:
None immediate.

PROCEDURE:
Informed written consent was obtained from the patient after a
discussion of the risks, benefits and alternatives to treatment. A
timeout was performed prior to the initiation of the procedure.

Initial ultrasound scanning demonstrates a large amount of ascites
within the right mid abdominal quadrant. The right mid abdomen was
prepped and draped in the usual sterile fashion. 1% lidocaine was
used for local anesthesia.

Following this, a 19 gauge, 7-cm, Yueh catheter was introduced. An
ultrasound image was saved for documentation purposes. The
paracentesis was performed. The catheter was removed and a dressing
was applied. The patient tolerated the procedure well without
immediate post procedural complication.
FINDINGS: A total of approximately 6.2 L of serous fluid was removed.
IMPRESSION: Successful ultrasound-guided paracentesis yielding 6.2 liters of
peritoneal fluid.

## 2018-10-17 ENCOUNTER — Encounter: Payer: Self-pay | Admitting: Internal Medicine

## 2018-10-27 ENCOUNTER — Telehealth: Payer: Self-pay | Admitting: Gastroenterology

## 2018-10-27 MED ORDER — LACTULOSE 10 GM/15ML PO SOLN
20.00 | ORAL | Status: DC
Start: 2018-10-27 — End: 2018-10-27

## 2018-10-27 MED ORDER — PANTOPRAZOLE SODIUM 40 MG IV SOLR
40.00 | INTRAVENOUS | Status: DC
Start: 2018-10-27 — End: 2018-10-27

## 2018-10-27 MED ORDER — FENTANYL CITRATE-NACL 2-0.9 MG/100ML-% IV SOLN
25.00 | INTRAVENOUS | Status: DC
Start: ? — End: 2018-10-27

## 2018-10-27 MED ORDER — LORAZEPAM 2 MG/ML IJ SOLN
1.00 | INTRAMUSCULAR | Status: DC
Start: ? — End: 2018-10-27

## 2018-10-27 MED ORDER — FENTANYL CITRATE (PF) 50 MCG/ML IJ SOLN
25.00 | INTRAMUSCULAR | Status: DC
Start: ? — End: 2018-10-27

## 2018-10-27 NOTE — Telephone Encounter (Signed)
Sorry to hear this.  

## 2018-10-27 NOTE — Telephone Encounter (Signed)
So sorry!  I made a note that the patient was deceased in his chart

## 2018-10-27 NOTE — Telephone Encounter (Signed)
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 Due to insurance purposes, he has not been seen here at [facility] since May 2019, but he had been a patient here dating back to Sept 2016.

## 2018-10-27 NOTE — Telephone Encounter (Signed)
So sorry! 

## 2018-10-29 DEATH — deceased

## 2018-10-30 NOTE — Telephone Encounter (Signed)
noted
